# Patient Record
Sex: Male | Born: 1950
Health system: Southern US, Community
[De-identification: ages and names within clinical notes are randomized; demographics above are authoritative.]

## PROBLEM LIST (undated history)

## (undated) DIAGNOSIS — K635 Polyp of colon: Secondary | ICD-10-CM

## (undated) DIAGNOSIS — E785 Hyperlipidemia, unspecified: Secondary | ICD-10-CM

## (undated) DIAGNOSIS — A4902 Methicillin resistant Staphylococcus aureus infection, unspecified site: Secondary | ICD-10-CM

## (undated) DIAGNOSIS — C181 Malignant neoplasm of appendix: Secondary | ICD-10-CM

## (undated) DIAGNOSIS — C801 Malignant (primary) neoplasm, unspecified: Secondary | ICD-10-CM

## (undated) DIAGNOSIS — N529 Male erectile dysfunction, unspecified: Secondary | ICD-10-CM

## (undated) HISTORY — DX: Methicillin resistant Staphylococcus aureus infection, unspecified site: A49.02

## (undated) HISTORY — PX: SHOULDER SURGERY: SHX246

## (undated) HISTORY — DX: Malignant (primary) neoplasm, unspecified: C80.1

## (undated) HISTORY — DX: Polyp of colon: K63.5

## (undated) HISTORY — DX: Hyperlipidemia, unspecified: E78.5

## (undated) HISTORY — DX: Malignant neoplasm of appendix: C18.1

## (undated) HISTORY — DX: Male erectile dysfunction, unspecified: N52.9

## (undated) HISTORY — PX: CORONARY ANGIOPLASTY: SHX604

---

## 2006-07-09 ENCOUNTER — Ambulatory Visit: Payer: Self-pay | Admitting: Gastroenterology

## 2006-07-09 HISTORY — PX: COLONOSCOPY: SHX174

## 2006-07-09 LAB — HM COLONOSCOPY

## 2006-08-04 DIAGNOSIS — C801 Malignant (primary) neoplasm, unspecified: Secondary | ICD-10-CM

## 2006-08-04 HISTORY — DX: Malignant (primary) neoplasm, unspecified: C80.1

## 2006-10-13 ENCOUNTER — Ambulatory Visit: Payer: Self-pay | Admitting: Urology

## 2007-01-03 HISTORY — PX: PROSTATE SURGERY: SHX751

## 2007-01-11 ENCOUNTER — Encounter (INDEPENDENT_AMBULATORY_CARE_PROVIDER_SITE_OTHER): Payer: Self-pay | Admitting: Urology

## 2007-01-11 ENCOUNTER — Inpatient Hospital Stay (HOSPITAL_COMMUNITY): Admission: RE | Admit: 2007-01-11 | Discharge: 2007-01-12 | Payer: Self-pay | Admitting: Urology

## 2007-02-04 ENCOUNTER — Ambulatory Visit: Payer: Self-pay | Admitting: Family Medicine

## 2007-05-19 ENCOUNTER — Ambulatory Visit: Payer: Self-pay | Admitting: Family Medicine

## 2010-12-17 NOTE — Op Note (Signed)
Edward Mccoy, Edward Mccoy NO.:  [id number]   MEDICAL RECORD NO.:  [id number]          PATIENT TYPE:  INP   LOCATION:  0008                         FACILITY:  [facility]   PHYSICIAN:  Heloise Purpura, MD      DATE OF BIRTH:  12/07/1950   DATE OF PROCEDURE:  01/11/2007  DATE OF DISCHARGE:                               OPERATIVE REPORT   PREOPERATIVE DIAGNOSIS:  Clinically localized adenocarcinoma of the  prostate.   POSTOPERATIVE DIAGNOSIS:  Clinically localized adenocarcinoma of the  prostate.   PROCEDURES:  1. Robotic-assisted laparoscopic radical prostatectomy (left nerve-      sparing).  2. Bilateral pelvic lymphadenectomy.   SURGEON:  Crecencio Mc, MD   ASSISTANT:  Terie Purser, MD   ANESTHESIA:  General.   COMPLICATIONS:  None.   ESTIMATED BLOOD LOSS:  150 mL.   INTRAVENOUS FLUIDS:  1500 mL of lactated Ringer's.   SPECIMENS:  1. Prostate and seminal vesicles.  2. Right pelvic lymph nodes.  3. Left pelvic lymph nodes.   DISPOSITION OF SPECIMEN:  To pathology.   DRAINS:  1. 20-French Coude catheter.  2. #19 Blake pelvic drain.   INDICATION:  [name] is a 60 year old gentleman with clinical stage T1-  C prostate cancer with a PSA of 5.5 and a Gleason score of 3+4=7.  He  had undergone a bone scan which was negative for metastatic disease.  After discussing management options for clinically localized prostate  cancer, he elected to proceed with surgical therapy and a robotic  prostatectomy.   DESCRIPTION OF PROCEDURE:  The patient was taken to the operating room  and a general anesthetic was administered.  He was given preoperative  antibiotics, placed in the dorsal lithotomy position, and prepped and  draped in the usual sterile fashion.  Next a preoperative time-out was  performed.  A Foley catheter was then inserted into the bladder.  A site  was selected just the left of the umbilicus for placement of the camera  port.  This was placed using a  standard open Hasson technique, which  allowed entry into the peritoneal cavity under direct vision, and a 12  mm port was then placed.  A pneumoperitoneum was established.  The 0-  degree lens was used to inspect the abdomen and there was no evidence of  any intra-abdominal injuries or other abnormalities.  The remaining  ports were then placed.  Bilateral 8 mm robotic ports were placed 10 cm  lateral to the camera port just inferior to the camera port site.  An  additional 8 mm robotic port was placed in the far left lateral  abdominal wall.  A 5 mm port was placed between the camera port and the  right robotic port.  An additional 12 mm port was placed in the far  right lateral abdominal wall.  All ports were placed under direct vision  and without difficulty.  The surgical cart was then docked.  With the  aid of the cautery scissors, the bladder was reflected posteriorly  allowing entry in the space of Retzius and  identification of the  endopelvic fascia and prostate.  The endopelvic fascia was incised from  the apex back the base of the prostate bilaterally and the underlying  levator muscle fibers were swept laterally off the prostate.  This  isolated the dorsal venous complex, which was then stapled and divided  with a 45 mm flex ETS stapler.  The bladder neck was identified with the  aid of Foley catheter manipulation and was divided anteriorly until the  Foley catheter was exposed.  The catheter balloon was deflated and the  catheter was brought into the operative field and used to retract the  prostate anteriorly.  This exposed the posterior bladder neck, which was  then divided and dissection continued between the bladder and prostate  until the vasa deferentia and seminal vesicles were identified.  The  vasa deferentia were identified, isolated and divided.  They were then  lifted anteriorly.  The seminal vesicles were dissected down to their  tips and after controlling the  seminal vesicle arterial blood supply,  lifted anteriorly.  The space between Denonvilliers fascia and the  anterior rectum was then bluntly developed, thereby isolating the  vascular pedicles of the prostate.  The lateral prostatic fascia on the  left side was incised sharply, allowing the neurovascular bundles to be  spread laterally and posteriorly off the prostate.  Hem-o-lok clips were  then used to divide the vascular pedicle of the prostate with division  of the vascular pedicles with sharp cold scissors dissection.  The  neurovascular bundle was then swept off the prostate out to the apex and  urethra.  On the right side a wide, non-nerve-sparing dissection was  performed with Hem-o-lok clips used to control the vascular pedicle of  the prostate.  The urethra was then sharply divided, allowing the  prostate specimen to be disarticulated.  The pelvis was copiously  irrigated and with irrigation in the pelvis, air was injected into the  rectal catheter.  There was no evidence of a rectal injury.  Hemostasis  appeared to be okay at this point.  Attention then turned to the right  pelvic sidewall.  The fibrofatty tissue between the external iliac vein,  confluence of the iliac vessels, and extending down to the hypogastric  artery and Cooper's ligament was dissected free from the pelvic  sidewall.  The obturator nerve was preserved.  Hem-o-lok clips were used  for lymphostasis and hemostasis.  An identical procedure was performed  on the contralateral side.  Both lymphatic packets were removed for  permanent pathologic analysis.  Attention then turned to the urethral  anastomosis.  A 2-0 Vicryl slip-knot was used to reapproximate  Denonvilliers fascia to the bladder neck and the urethra.  A double-  armed 3-0 Monocryl suture was then used to perform a 360-degree running  tension-free anastomosis between the bladder neck and urethra.  There was noted to be 2 small bleeding arteries off  the left neurovascular  bundle.  They were able to be isolated and lifted off the neurovascular  bundle with 5 mm Hem-o-lok clips placed to control them.  A new 20-  [place] Coude catheter was inserted into the bladder and irrigated.  There appeared to be no blood clots within the bladder and the  anastomosis appeared to be watertight.  A #19 Blake pelvic drain was  then placed through the left robotic port and appropriately positioned  in the pelvis.  It was secured to the skin with a nylon suture.  The  surgical cart was then undocked.  The right lateral 12 mm port site was  closed with a 0 Vicryl suture placed with the aid of the suture passer  device.  The prostate specimen was then removed intact within the  Endopouch retrieval bag via the periumbilical port site.  This fascial  opening was closed with a running 0 Vicryl suture.  All remaining port  sites had been removed under direct vision.  Marcaine 0.25% was  then injected into all incision sites, which were reapproximated at the  skin level with staples.  Sterile dressings were applied.  The patient  appeared to tolerate the procedure well and without complications.  He  was able to be extubated and transferred to the recovery unit in  satisfactory condition.           ______________________________  Heloise Purpura, MD  Electronically Signed     LB/MEDQ  D:  01/11/2007  T:  01/11/2007  Job:  [id number]

## 2010-12-17 NOTE — Discharge Summary (Signed)
[name], [name] NO.:  [id number]   MEDICAL RECORD NO.:  [id number]          PATIENT TYPE:  INP   LOCATION:  1419                         FACILITY:  [facility]   PHYSICIAN:  Heloise Purpura, MD      DATE OF BIRTH:  12-19-1950   DATE OF ADMISSION:  01/11/2007  DATE OF DISCHARGE:  01/12/2007                               DISCHARGE SUMMARY   ADMISSION DIAGNOSIS:  Prostate cancer.   DISCHARGE DIAGNOSIS:  Prostate cancer.   PROCEDURE:  1. Robotic assisted laparoscopic radical prostatectomy.  2. Bilateral pelvic lymphadenectomy.   HISTORY AND PHYSICAL:  For full details, please see admission History  and Physical.  Briefly, [name] is a 60 year old gentleman with  clinical stage T1C prostate cancer with a PSA of 5.5 and Gleason score  of 3+4=7.  After undergoing a bone scan, which was negative metastatic  disease, he elected to proceed with surgical therapy.   HOSPITAL COURSE:  The patient was taken to the operating room on January 11, 2007, and underwent the above procedures.  He tolerated these procedures  well without complications.  Postoperatively, he was able to be  transferred to a regular hospital room.  He was able to begin ambulating  the night of surgery which he did without difficulty.   On postoperative day #1, he was noted to be hemodynamically stable.  His  hemoglobin was checked and was 12.3.  He maintained excellent urine  output from his Foley catheter with minimal output from his pelvic  drain.  His pelvic drain was, therefore, removed.  He was begun on a  clear liquid diet which he tolerated well and was able to be  transitioned to oral pain medication.  By the afternoon of postoperative  day #1, he was able to be discharged home in excellent condition.   DISPOSITION:  Home.   DISCHARGE MEDICATIONS:  The patient was instructed to resume his regular  home medications excepting any aspirin, nonsteroidal anti-inflammatory  drugs, or herbal  supplements.  He was given a prescription to take  Vicodin as needed for pain, told to use Colace as a stool softener, and  given a prescription to begin Cipro 1 day prior to his return visit for  Foley catheter removal.   DISCHARGE INSTRUCTIONS:  The patient was instructed to be ambulatory but  specifically told to refrain from any heavy lifting, strenuous activity  or driving.  He was given instructions on routine Foley catheter care.  He was also told to gradually advance his diet once passing flatus.   FOLLOWUP:  Mr. Ladnier will follow up in 1 week for removal of his Foley  catheter and to discuss his surgical pathology in detail.           ______________________________  Heloise Purpura, MD  Electronically Signed     LB/MEDQ  D:  01/12/2007  T:  01/12/2007  Job:  [id number]

## 2010-12-17 NOTE — H&P (Signed)
NAMERANA, ADORNO NO.:  [id number]   MEDICAL RECORD NO.:  [id number]          PATIENT TYPE:  INP   LOCATION:  0008                         FACILITY:  [facility]   PHYSICIAN:  Heloise Purpura, MD      DATE OF BIRTH:  17-Apr-1951   DATE OF ADMISSION:  01/11/2007  DATE OF DISCHARGE:                              HISTORY & PHYSICAL   CHIEF COMPLAINT:  Prostate cancer.   HISTORY:  Mr. Stratton is a 60 year old gentleman with clinical stage T1c  prostate cancer with a PSA of 5.5 and Gleason score of 3+4=7.  He had  previously undergone a bone scan which was negative for metastatic  disease.  After discussing management options for clinically localized  prostate cancer, the patient elected to proceed with surgical therapy  and a robotic prostatectomy.   PAST MEDICAL HISTORY:  None.   PAST SURGICAL HISTORY:  Left rotator cuff repair.   MEDICATIONS:  Aspirin.   ALLERGIES:  NO KNOWN DRUG ALLERGIES.   FAMILY HISTORY:  No history of GU malignancy or prostate cancer..   SOCIAL HISTORY:  The patient is employed as a Medical illustrator.  He is married.  He denies tobacco and drinks alcohol socially.   REVIEW OF SYSTEMS:  All systems are reviewed and are negative.   PHYSICAL EXAMINATION:  CONSTITUTIONAL:  Well-nourished, well-developed,  age-appropriate male in no acute distress.  CARDIOVASCULAR:  Regular rate and rhythm without obvious murmurs.  LUNGS:  Clear bilaterally.  ABDOMEN:  Soft, nontender, nondistended without abdominal masses.  RECTAL:  Digital rectal exam revealed no nodularity or induration of the  prostate.   IMPRESSION:  Clinically localized prostate cancer.   PLAN:  Mr. Vences will undergo a robotic assisted laparoscopic radical  prostatectomy and bilateral pelvic lymphadenectomy.  He will then be  admitted to the hospital for routine postoperative care.           ______________________________  Heloise Purpura, MD  Electronically Signed     LB/MEDQ  D:   01/11/2007  T:  01/11/2007  Job:  [id number]

## 2011-05-22 LAB — BASIC METABOLIC PANEL
BUN: 12
CO2: 25
Chloride: 103
Creatinine, Ser: 0.84
GFR calc Af Amer: >60
Glucose, Bld: 94

## 2011-05-22 LAB — CBC
MCHC: 34.4
MCV: 91
RBC: 4.83
RDW: 13.1

## 2011-05-22 LAB — HEMOGLOBIN AND HEMATOCRIT, BLOOD
HCT: 36.2 — ABNORMAL LOW
Hemoglobin: 14.1

## 2011-05-22 LAB — TYPE AND SCREEN: ABO/RH(D): A NEG

## 2013-01-31 DIAGNOSIS — Z8546 Personal history of malignant neoplasm of prostate: Secondary | ICD-10-CM | POA: Insufficient documentation

## 2013-01-31 DIAGNOSIS — N529 Male erectile dysfunction, unspecified: Secondary | ICD-10-CM | POA: Insufficient documentation

## 2013-07-06 ENCOUNTER — Ambulatory Visit: Payer: Self-pay | Admitting: Unknown Physician Specialty

## 2013-08-26 ENCOUNTER — Ambulatory Visit: Payer: Self-pay | Admitting: Otolaryngology

## 2014-08-10 LAB — LIPID PANEL
CHOLESTEROL: 210 mg/dL — AB (ref 0–200)
HDL: 51 mg/dL (ref 35–70)
LDL Cholesterol: 131 mg/dL
LDl/HDL Ratio: 2.6
TRIGLYCERIDES: 139 mg/dL (ref 40–160)

## 2014-08-10 LAB — TSH: TSH: 1.87 u[IU]/mL (ref 0.41–5.90)

## 2014-08-10 LAB — CBC AND DIFFERENTIAL
NEUTROS ABS: 4 /uL
WBC: 6.4 10*3/mL

## 2014-08-10 LAB — BASIC METABOLIC PANEL
BUN: 20 mg/dL (ref 4–21)
CREATININE: 1 mg/dL (ref 0.6–1.3)
Glucose: 102 mg/dL
SODIUM: 141 mmol/L (ref 137–147)

## 2014-11-13 ENCOUNTER — Ambulatory Visit
Admit: 2014-11-13 | Disposition: A | Discharge status: Home / Self Care | Payer: Self-pay | Attending: Family Medicine | Admitting: Family Medicine

## 2014-11-13 LAB — CBC WITH DIFFERENTIAL/PLATELET
BASOS PCT: 0.4 %
Basophil #: 0 10*3/uL (ref 0.0–0.1)
EOS PCT: 3.2 %
Eosinophil #: 0.2 10*3/uL (ref 0.0–0.7)
HCT: 41.1 % (ref 40.0–52.0)
HGB: 13.6 g/dL (ref 13.0–18.0)
LYMPHS ABS: 1.8 10*3/uL (ref 1.0–3.6)
Lymphocyte %: 28.9 %
MCH: 30.5 pg (ref 26.0–34.0)
MCHC: 33.1 g/dL (ref 32.0–36.0)
MCV: 92 fL (ref 80–100)
MONO ABS: 0.7 x10 3/mm (ref 0.2–1.0)
Monocyte %: 10.6 %
Neutrophil #: 3.5 10*3/uL (ref 1.4–6.5)
Neutrophil %: 56.9 %
Platelet: 190 10*3/uL (ref 150–440)
RBC: 4.46 10*6/uL (ref 4.40–5.90)
RDW: 12.6 % (ref 11.5–14.5)
WBC: 6.2 10*3/uL (ref 3.8–10.6)

## 2014-11-13 LAB — COMPREHENSIVE METABOLIC PANEL
ALBUMIN: 4.6 g/dL
ALT: 34 U/L
ANION GAP: 7 (ref 7–16)
Alkaline Phosphatase: 39 U/L
BUN: 18 mg/dL
Bilirubin,Total: 0.9 mg/dL
CALCIUM: 9.1 mg/dL
CO2: 28 mmol/L
Chloride: 104 mmol/L
Creatinine: 1.02 mg/dL
Glucose: 93 mg/dL
Potassium: 3.9 mmol/L
SGOT(AST): 30 U/L
Sodium: 139 mmol/L
TOTAL PROTEIN: 7.3 g/dL

## 2014-11-14 ENCOUNTER — Encounter: Payer: Self-pay | Admitting: General Surgery

## 2014-11-14 ENCOUNTER — Ambulatory Visit (INDEPENDENT_AMBULATORY_CARE_PROVIDER_SITE_OTHER): Payer: BLUE CROSS/BLUE SHIELD | Admitting: General Surgery

## 2014-11-14 VITALS — BP 132/76 | HR 68 | Resp 12 | Ht 69.0 in | Wt 192.0 lb

## 2014-11-14 DIAGNOSIS — R1031 Right lower quadrant pain: Secondary | ICD-10-CM

## 2014-11-14 DIAGNOSIS — K389 Disease of appendix, unspecified: Secondary | ICD-10-CM | POA: Diagnosis not present

## 2014-11-14 DIAGNOSIS — K388 Other specified diseases of appendix: Secondary | ICD-10-CM

## 2014-11-14 NOTE — Progress Notes (Signed)
Patient ID: Edward Mccoy, male   DOB: 1951-07-10, 64 y.o.   MRN: [id number]  Chief Complaint  Patient presents with  . Abdominal Pain    HPI Edward Mccoy is a 64 y.o. male.  Here today for evaluation of abdominal pain. He states it started about 2 months ago. CT scan was 11-13-14. The constant pain is in the lower right abdomen. He notices it more with stepping movements and taking deep breaths.   HPI  Past Medical History  Diagnosis Date  . Cancer 2008    prostate  . Colon polyp     Past Surgical History  Procedure Laterality Date  . Prostate surgery  June 2008  . Shoulder surgery  Nov 1999, Oct 2015  . Colonoscopy  07-09-06    Dr Allen Norris    Family History  Problem Relation Age of Onset  . Cancer Mother     Social History History  Substance Use Topics  . Smoking status: Never Smoker   . Smokeless tobacco: Never Used  . Alcohol Use: 0.0 oz/week    0 Standard drinks or equivalent per week     Comment: occasionally    No Known Allergies  Current Outpatient Prescriptions  Medication Sig Dispense Refill  . Ascorbic Acid (VITAMIN C) 1000 MG tablet Take 1,000 mg by mouth daily.    Marland Kitchen aspirin EC 81 MG tablet Take 81 mg by mouth daily.    . Multiple Vitamins-Minerals (MULTIVITAMIN ADULT PO) Take by mouth daily.    . simvastatin (ZOCOR) 10 MG tablet Take 10 mg by mouth daily.    . vitamin E 400 UNIT capsule Take 400 Units by mouth daily.     No current facility-administered medications for this visit.    Review of Systems Review of Systems  Constitutional: Negative.   Respiratory: Negative.   Cardiovascular: Negative.   Gastrointestinal: Positive for abdominal pain.    Blood pressure 132/76, pulse 68, resp. rate 12, height 5\' 9"  (1.753 m), weight 192 lb (87.091 kg).  Physical Exam Physical Exam  Constitutional: He is oriented to person, place, and time. He appears well-developed and well-nourished.  Eyes: Conjunctivae are normal. No scleral icterus.  Neck: Neck  supple.  Cardiovascular: Normal rate, regular rhythm and normal heart sounds.   Pulmonary/Chest: Effort normal and breath sounds normal.  Abdominal: Soft. Normal appearance and bowel sounds are normal. There is tenderness in the right lower quadrant. There is no rebound and no guarding. No hernia.  Mild tenderness RLQ no guarding or rebound.  Lymphadenopathy:    He has no cervical adenopathy.  Neurological: He is alert and oriented to person, place, and time.  Skin: Skin is warm and dry.    Data Reviewed Office notes. CT- reviewed. Findings suggest a mucocele of appendix CBC is normal Assessment    Appendix mass-likely a mucocele.      Plan    Discussed risk and benefits of laparoscopic appendectomy, patient agrees to proceed. Also advised on possible need for right colectomy in the event of a malignancy.  Patient is scheduled for surgery at [facility] on 11/15/14. He will pre admit at the hospital on 11/15/14 at 9:45 am. Patient is aware of date, time, and all instructions.      PCP:  Cranford Mon, Richard   Junie Panning G 11/14/2014, 11:06 AM

## 2014-11-14 NOTE — Patient Instructions (Addendum)
The patient is aware to call back for any questions or concerns. Laparoscopic Appendectomy Appendectomy is surgery to remove the appendix. Laparoscopic surgery uses several small cuts (incisions) instead of one large incision. Laparoscopic surgery offers a shorter recovery time and less discomfort. LET YOUR CAREGIVER KNOW ABOUT:  Allergies to food or medicine.  Medicines taken, including vitamins, dietary supplements, herbs, eyedrops, over-the-counter medicines, and creams.  Use of steroids (by mouth or creams).  Previous problems with anesthetics or numbing medicines.  History of bleeding problems or blood clots.  Previous surgery.  Other health problems, including diabetes, heart problems, lung problems, and kidney problems.  Possibility of pregnancy, if this applies. RISKS AND COMPLICATIONS  Infection. A germ starts growing in the wound. This can usually be treated with antibiotics. In some cases, the wound will need to be opened and cleaned.  Bleeding.  Damage to other organs.  Sores (abscesses).  Chronic pain at the incision sites. This is defined as pain that lasts for more than 3 months.  Blood clots in the legs that may rarely travel to the lungs.  Infection in the lungs (pneumonia). BEFORE THE PROCEDURE Appendectomy is usually performed immediately after an inflamed appendix (appendicitis) is diagnosed. No preparation is necessary ahead of this procedure. PROCEDURE  You will be given medicine that makes you sleep (general anesthetic). After you are asleep, a flexible tube (catheter) may be inserted into your bladder to drain your urine during surgery. The tube is removed before you wake up after surgery. When you are asleep, carbondioxide gas will be used to inflate your abdomen. This will allow your surgeon to see inside your abdomen and perform your surgery. Three small incisions will be made in your abdomen. Your surgeon will insert a thin, lighted tube  (laparoscope) through one of the incisions. Your surgeon will look through the laparoscope while performing the surgery. Other tools will be inserted through the other incisions. Laparoscopic procedures may not be appropriate when:  There is major scarring from a previous surgery.  The patient has bleeding disorders.  A pregnancy is near term.  There are other conditions which make the laparoscopic procedure impossible, such as an advanced infection or a ruptured appendix. If your surgeon feels it is not safe to continue with the laparoscopic procedure, he or she will perform an open surgery instead. This gives the surgeon a larger view and more space to work. Open surgery requires a longer recovery time. After your appendix is removed, your incisions will be closed with stitches (sutures) or skin adhesive. AFTER THE PROCEDURE You will be taken to a recovery room. When the anesthesia has worn off, you will be returned to your hospital room. You will be given pain medicines to keep you comfortable. Ask your caregiver how long your hospital stay will be. Document Released: 03/04/2004 Document Revised: 10/13/2011 Document Reviewed: 01/28/2011 [facility] Patient Information 2015 [place], [place]. This information is not intended to replace advice given to you by your health care provider. Make sure you discuss any questions you have with your health care provider.  Patient is scheduled for surgery at [facility] on 11/15/14. He will pre admit at the hospital on 11/15/14 at 9:45 am. Patient is aware of date, time, and all instructions.

## 2014-11-15 ENCOUNTER — Ambulatory Visit
Admit: 2014-11-15 | Disposition: A | Discharge status: Home / Self Care | Payer: Self-pay | Attending: General Surgery | Admitting: General Surgery

## 2014-11-15 DIAGNOSIS — C181 Malignant neoplasm of appendix: Secondary | ICD-10-CM

## 2014-11-15 DIAGNOSIS — K388 Other specified diseases of appendix: Secondary | ICD-10-CM | POA: Diagnosis not present

## 2014-11-15 HISTORY — DX: Malignant neoplasm of appendix: C18.1

## 2014-11-15 HISTORY — PX: APPENDECTOMY: SHX54

## 2014-11-17 ENCOUNTER — Encounter: Payer: Self-pay | Admitting: General Surgery

## 2014-11-20 ENCOUNTER — Encounter: Payer: Self-pay | Admitting: General Surgery

## 2014-11-20 ENCOUNTER — Other Ambulatory Visit: Payer: Self-pay | Admitting: *Deleted

## 2014-11-20 ENCOUNTER — Ambulatory Visit (INDEPENDENT_AMBULATORY_CARE_PROVIDER_SITE_OTHER): Payer: BLUE CROSS/BLUE SHIELD | Admitting: General Surgery

## 2014-11-20 ENCOUNTER — Telehealth: Payer: Self-pay | Admitting: *Deleted

## 2014-11-20 ENCOUNTER — Encounter: Payer: Self-pay | Admitting: *Deleted

## 2014-11-20 VITALS — BP 130/74 | HR 74 | Resp 12 | Ht 69.0 in | Wt 200.0 lb

## 2014-11-20 DIAGNOSIS — K388 Other specified diseases of appendix: Secondary | ICD-10-CM

## 2014-11-20 DIAGNOSIS — C181 Malignant neoplasm of appendix: Secondary | ICD-10-CM

## 2014-11-20 DIAGNOSIS — K389 Disease of appendix, unspecified: Secondary | ICD-10-CM

## 2014-11-20 MED ORDER — NEOMYCIN SULFATE 500 MG PO TABS: ORAL_TABLET | ORAL | Status: DC

## 2014-11-20 MED ORDER — METRONIDAZOLE 500 MG PO TABS: ORAL_TABLET | ORAL | Status: AC

## 2014-11-20 MED ORDER — POLYETHYLENE GLYCOL 3350 17 GM/SCOOP PO POWD: ORAL | Status: DC

## 2014-11-20 NOTE — Progress Notes (Signed)
This is a 64 year old male here today with his wife for his post op appendectomy done on 11/15/14. Patient states he is doing well with no new problems at this time.  Incisions are clean and well healing. 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 Discussed treatment options to include right hemicolectomy with lymph node removal. Additional treatment with chemotherapy. Procedure explained including risks and benefits and recovery time. Consulted with oncology. Appt to be made for oncology input.  Questions answered and patient is agreeable.  Follow up in 2 weeks-preop.

## 2014-11-20 NOTE — Progress Notes (Signed)
Patient ID: Edward Mccoy, male   DOB: 1951/07/06, 64 y.o.   MRN: [id number]   Patient's surgery has been scheduled for 12-15-14 at [facility].   This patient has been instructed to complete bowel prep prior. He is to call the office if he has further questions.

## 2014-11-20 NOTE — Progress Notes (Signed)
Bowel prep medications sent to patient's pharmacy.

## 2014-11-20 NOTE — Telephone Encounter (Signed)
Patient has been scheduled for an appointment with Dr. Oliva Bustard at the [facility] for Wednesday, 11-22-14 at 3 pm.  He is aware of date, time, and instructions.

## 2014-11-22 ENCOUNTER — Ambulatory Visit
Admit: 2014-11-22 | Disposition: A | Discharge status: Home / Self Care | Payer: Self-pay | Attending: Oncology | Admitting: Oncology

## 2014-11-22 ENCOUNTER — Other Ambulatory Visit: Payer: Self-pay | Admitting: General Surgery

## 2014-11-22 DIAGNOSIS — C181 Malignant neoplasm of appendix: Secondary | ICD-10-CM

## 2014-11-23 ENCOUNTER — Encounter: Payer: Self-pay | Admitting: General Surgery

## 2014-11-23 ENCOUNTER — Telehealth: Payer: Self-pay | Admitting: *Deleted

## 2014-11-23 LAB — CEA: CEA: 5.8 ng/mL — ABNORMAL HIGH (ref 0.0–4.7)

## 2014-11-23 LAB — CANCER ANTIGEN 19-9: CA 19-9: 13 U/mL (ref 0–35)

## 2014-11-23 NOTE — Telephone Encounter (Signed)
Patient's surgery that was scheduled for 12-15-14 has been cancelled per Dr. Jamal Collin. The patient and Dr. Jamal Collin have gone back and forth and decided not to do at this time.   Leah in the [facility] notified.

## 2014-11-27 LAB — SURGICAL PATHOLOGY

## 2014-12-03 NOTE — Op Note (Signed)
PATIENT NAME:  Edward Mccoy, Edward Mccoy MR#:  [id number] DATE OF BIRTH:  04/06/1951  DATE OF PROCEDURE:  11/15/2014  PREOPERATIVE DIAGNOSIS:  Mucocele of the appendix.   POSTOPERATIVE DIAGNOSIS:  Mucocele of the appendix.  OPERATION PERFORMED: Laparoscopy and appendectomy.   SURGEON:  Mckinley Jewel, M.D.   ANESTHESIA: General.   COMPLICATIONS: None.   ESTIMATED BLOOD LOSS: About 100 mL.   DRAINS: None.  DESCRIPTION OF THE PROCEDURE:  The patient was put to sleep in the supine position on the operating table. Foley catheter was inserted and was removed at the end of the procedure. The abdomen was prepped and draped out as a sterile field and timeout was performed. A port incision was made at the umbilicus and the Veress needle with the InnerDyne sleeve was positioned in the peritoneal cavity and verified with the hang and drop method and pneumoperitoneum was obtained. A 10 mm port was then placed. The camera was introduced with good visualization, and a suprapubic 5 mm port in the left lower quadrant and 12 mm ports were then placed. With the patient placed slightly tilted to the left side, the area of the mucocele as seen on CT was exposed. The patient had a very large mucocele of the appendix, which appeared to be occupying a position alongside the right colon, then going all the way up towards the right upper quadrant. The end of this was not easily visualized, but the base of this was relatively narrow, and accordingly this was first freed from the mesoappendix and transected with the blue load of the Endo GIA.   Following this, a dissection was then performed in a tedious manner to free up the mucocele from the right colon and the right mesentery, and in the course of this, some small bleeding vessels were encountered which required  either cauterization or a hemoclip.   Dissection was continued until the distal end of the appendix was reached, but this portion was so firmly adherent to the mesentery of  the right colon that it was difficult to peel it off satisfactorily. In order to minimize any additional bleeding or other issues, it was felt reasonable to make a small incision overlying this spot. This area was marked in the lateral right  abdomen in the right upper quadrant area, and a 3 cm incision was made and carefully deepened through the layers into the peritoneal cavity. With adequate retraction, the mucocele was identified. The mucocele was brought up through this area and the remaining attachments were then taken down carefully and then the attachments were clamped, cut and ligated with 3-0 Vicryl. The specimen was sent in formalin to pathology.   The peritoneum of this lateral incision was closed with a running 0 Vicryl stitch. The wound was irrigated and the fascial layers closed with interrupted figure-of-eight stitches of 0 Maxon. Subcutaneous tissue was closed with 2-0 Vicryl and the skin closed with subcuticular 4-0 Vicryl. The pneumoperitoneum was reinstituted and the camera was introduced, and the right upper quadrant and the right gutter area were then carefully inspected with no bleeding noted. The area was irrigated and all fluid suctioned out from this area. The staple line on the appendiceal stump appeared to be intact. Pneumoperitoneum was released after closure of the left lower quadrant port site with a 0 Vicryl stitch. After removal of the remaining ports, the fascial opening at the umbilicus was closed with 0 Vicryl and the skin incisions were all closed with subcuticular 4-0 Vicryl. All incisions  were covered with LiquiBand. The patient subsequently was returned to the recovery room in stable condition    ____________________________ S.Robinette Haines, MD [phone] D: 11/16/2014 08:29:00 ET T: 11/16/2014 09:13:03 ET JOB#: [id number]  cc: Synthia Innocent. Jamal Collin, MD, <Dictator> [facility] Robinette Haines MD ELECTRONICALLY SIGNED 11/17/2014 8:42

## 2014-12-04 ENCOUNTER — Ambulatory Visit (INDEPENDENT_AMBULATORY_CARE_PROVIDER_SITE_OTHER): Payer: BLUE CROSS/BLUE SHIELD | Admitting: General Surgery

## 2014-12-04 ENCOUNTER — Encounter: Payer: Self-pay | Admitting: General Surgery

## 2014-12-04 DIAGNOSIS — C181 Malignant neoplasm of appendix: Secondary | ICD-10-CM

## 2014-12-04 NOTE — Progress Notes (Signed)
This is a 64 year old male here today  for his post op appendectomy done on 11/15/14. Patient states he is doing well with no new problems at this time.  Incisions and port sites are clean andl healing well..Abdomen is soft.  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 His case was discussed at length in tumor case conference. As the lesion is well contained in the appendix, careful follow up was recommended to include labs, imaging and diagnostic laparoscopy.  Pt will be followed by Dr. Oliva Bustard from oncology in addition to follow up here.  Pt advised again. Likely will need a diagnostic laparoscopy in 38mos.

## 2014-12-04 NOTE — Patient Instructions (Addendum)
Patient to return in five months

## 2014-12-05 ENCOUNTER — Encounter: Payer: Self-pay | Admitting: General Surgery

## 2014-12-15 ENCOUNTER — Inpatient Hospital Stay: Admit: 2014-12-15 | Payer: Self-pay | Admitting: General Surgery

## 2014-12-15 SURGERY — COLECTOMY, RIGHT, LAPAROSCOPIC
Anesthesia: General | Laterality: Right

## 2015-01-05 ENCOUNTER — Other Ambulatory Visit: Payer: Self-pay | Admitting: *Deleted

## 2015-01-05 DIAGNOSIS — C181 Malignant neoplasm of appendix: Secondary | ICD-10-CM

## 2015-01-08 ENCOUNTER — Inpatient Hospital Stay: Payer: 59 | Attending: Oncology

## 2015-01-08 DIAGNOSIS — Z79899 Other long term (current) drug therapy: Secondary | ICD-10-CM | POA: Insufficient documentation

## 2015-01-08 DIAGNOSIS — Z8589 Personal history of malignant neoplasm of other organs and systems: Secondary | ICD-10-CM | POA: Insufficient documentation

## 2015-01-08 DIAGNOSIS — Z7982 Long term (current) use of aspirin: Secondary | ICD-10-CM | POA: Insufficient documentation

## 2015-01-08 DIAGNOSIS — Z809 Family history of malignant neoplasm, unspecified: Secondary | ICD-10-CM | POA: Diagnosis not present

## 2015-01-08 DIAGNOSIS — Z86010 Personal history of colonic polyps: Secondary | ICD-10-CM | POA: Insufficient documentation

## 2015-01-08 DIAGNOSIS — Z8546 Personal history of malignant neoplasm of prostate: Secondary | ICD-10-CM | POA: Diagnosis not present

## 2015-01-08 DIAGNOSIS — C181 Malignant neoplasm of appendix: Secondary | ICD-10-CM

## 2015-01-09 LAB — CEA: CEA: 0.7 ng/mL (ref 0.0–4.7)

## 2015-01-22 ENCOUNTER — Inpatient Hospital Stay (HOSPITAL_BASED_OUTPATIENT_CLINIC_OR_DEPARTMENT_OTHER): Payer: 59 | Admitting: Oncology

## 2015-01-22 VITALS — BP 128/73 | HR 52 | Temp 96.2°F | Wt 183.4 lb

## 2015-01-22 DIAGNOSIS — Z79899 Other long term (current) drug therapy: Secondary | ICD-10-CM | POA: Diagnosis not present

## 2015-01-22 DIAGNOSIS — Z86010 Personal history of colonic polyps: Secondary | ICD-10-CM

## 2015-01-22 DIAGNOSIS — Z8589 Personal history of malignant neoplasm of other organs and systems: Secondary | ICD-10-CM | POA: Diagnosis not present

## 2015-01-22 DIAGNOSIS — Z809 Family history of malignant neoplasm, unspecified: Secondary | ICD-10-CM

## 2015-01-22 DIAGNOSIS — Z7982 Long term (current) use of aspirin: Secondary | ICD-10-CM

## 2015-01-22 DIAGNOSIS — C181 Malignant neoplasm of appendix: Secondary | ICD-10-CM

## 2015-01-22 DIAGNOSIS — Z8546 Personal history of malignant neoplasm of prostate: Secondary | ICD-10-CM

## 2015-01-22 NOTE — Progress Notes (Signed)
Pt does have living will.  Never smoked.

## 2015-02-01 ENCOUNTER — Encounter: Payer: Self-pay | Admitting: Oncology

## 2015-02-01 NOTE — Progress Notes (Signed)
[place] @ [facility] [phone]  Fax:([phone]) [place]: 02/04/1951  MR#: [id number]  INO#:[id number]  Patient Care Team: [name]., MD as PCP - General (Family Medicine) [name]., MD (Family Medicine) Seeplaputhur Robinette Haines, MD (General Surgery)  CHIEF COMPLAINT:  Chief Complaint  Patient presents with  . Follow-up    Oncology History   Carcinoma of appendix. Mucinouscarcinoma no evidence of invasion or extra appendiceal tumor present status post appendectomyin April of 2016     Malignant neoplasm of appendix   11/20/2014 Initial Diagnosis Malignant neoplasm of appendix    No flowsheet data found.  INTERVAL HISTORY: 64 year old gentleman with a history of carcinoma of the appendix came today further follow-up. Patient has slightly elevated CEA which on repeat examination today has dropped to 0.7.  No abdominal pain.  No nausea.  No vomiting.  No diarrhea.  REVIEW OF SYSTEMS:   GENERAL:  Feels good.  Active.  No fevers, sweats or weight loss. PERFORMANCE STATUS (ECOG):  0 HEENT:  No visual changes, runny nose, sore throat, mouth sores or tenderness. Lungs: No shortness of breath or cough.  No hemoptysis. Cardiac:  No chest pain, palpitations, orthopnea, or PND. GI:  No nausea, vomiting, diarrhea, constipation, melena or hematochezia. GU:  No urgency, frequency, dysuria, or hematuria. Musculoskeletal:  No back pain.  No joint pain.  No muscle tenderness. Extremities:  No pain or swelling. Skin:  No rashes or skin changes. Neuro:  No headache, numbness or weakness, balance or coordination issues. Endocrine:  No diabetes, thyroid issues, hot flashes or night sweats. Psych:  No mood changes, depression or anxiety. Pain:  No focal pain. Review of systems:  All other systems reviewed and found to be negative. As per HPI. Otherwise, a complete review of systems is negatve.  PAST MEDICAL HISTORY: Past Medical History    Diagnosis Date  . Cancer 2008    prostate  . Colon polyp     PAST SURGICAL HISTORY: Past Surgical History  Procedure Laterality Date  . Prostate surgery  June 2008  . Shoulder surgery  Nov 1999, Oct 2015  . Colonoscopy  07-09-06    Dr Allen Norris  . Appendectomy  11/15/14    FAMILY HISTORY Family History  Problem Relation Age of Onset  . Cancer Mother     ADVANCED DIRECTIVES:  No flowsheet data found.  HEALTH MAINTENANCE: History  Substance Use Topics  . Smoking status: Never Smoker   . Smokeless tobacco: Never Used  . Alcohol Use: 0.0 oz/week    0 Standard drinks or equivalent per week     Comment: occasionally      No Known Allergies  Current Outpatient Prescriptions  Medication Sig Dispense Refill  . Ascorbic Acid (VITAMIN C) 1000 MG tablet Take 1,000 mg by mouth daily.    Marland Kitchen aspirin EC 81 MG tablet Take 81 mg by mouth daily.    . Multiple Vitamins-Minerals (MULTIVITAMIN ADULT PO) Take by mouth daily.    Marland Kitchen neomycin (MYCIFRADIN) 500 MG tablet Take two (2) tablets at 6 pm and two (2) tablets at 11 pm the night prior to surgery. 4 tablet 0  . simvastatin (ZOCOR) 10 MG tablet Take 10 mg by mouth daily.    . vitamin E 400 UNIT capsule Take 400 Units by mouth daily.    . polyethylene glycol powder (GLYCOLAX/MIRALAX) powder 255 grams one bottle for colonoscopy prep (Patient not taking: Reported on 01/22/2015) 255 g  0   No current facility-administered medications for this visit.    OBJECTIVE:  Filed Vitals:   01/22/15 0945  BP: 128/73  Pulse: 52  Temp: 96.2 F (35.7 C)     Body mass index is 27.07 kg/(m^2).    ECOG FS:0 - Asymptomatic  PHYSICAL EXAM: GENERAL:  Well developed, well nourished, sitting comfortably in the exam room in no acute distress. MENTAL STATUS:  Alert and oriented to person, place and time. HEAD:  Normocephalic, atraumatic, face symmetric, no Cushingoid features. EYES:  .  Pupils equal round and reactive to light and accomodation.  No  conjunctivitis or scleral icterus. ENT:  Oropharynx clear without lesion.  Tongue normal. Mucous membranes moist.  RESPIRATORY:  Clear to auscultation without rales, wheezes or rhonchi. CARDIOVASCULAR:  Regular rate and rhythm without murmur, rub or gallop.  ABDOMEN:  Soft, non-tender, with active bowel sounds, and no hepatosplenomegaly.  No masses. BACK:  No CVA tenderness.  No tenderness on percussion of the back or rib cage. SKIN:  No rashes, ulcers or lesions. EXTREMITIES: No edema, no skin discoloration or tenderness.  No palpable cords. LYMPH NODES: No palpable cervical, supraclavicular, axillary or inguinal adenopathy  NEUROLOGICAL: Unremarkable. PSYCH:  Appropriate.  LAB RESULTS:  No visits with results within 2 Day(s) from this visit. Latest known visit with results is:  Appointment on 01/08/2015  Component Date Value Ref Range Status  . CEA 01/08/2015 0.7  0.0 - 4.7 ng/mL Final   Comment: (NOTE)       Roche ECLIA methodology       Nonsmokers  <3.9                                     Smokers     <5.6 Performed At: [facility] [place], [place] [id number] Lindon Romp MD DQ:[id number]       ASSESSMENT: Carcinoma of appendix  MEDICAL DECISION MAKING:  All lab data has been reviewed.   On clinical ground there is no evidence of recurrent disease  CEA has now decreased to normal range of 0.7  No further investigation is being planned  Repeat CT scan in one year from or regional diagnoses or before if patient develops any symptoms . Patient expressed understanding and was in agreement with this plan. He also understands that He can call clinic at any time with any questions, concerns, or complaints.    No matching staging information was found for the patient.  Forest Gleason, MD   02/01/2015 7:57 AM

## 2015-05-05 DIAGNOSIS — A4902 Methicillin resistant Staphylococcus aureus infection, unspecified site: Secondary | ICD-10-CM

## 2015-05-05 HISTORY — DX: Methicillin resistant Staphylococcus aureus infection, unspecified site: A49.02

## 2015-05-09 ENCOUNTER — Ambulatory Visit (INDEPENDENT_AMBULATORY_CARE_PROVIDER_SITE_OTHER): Payer: 59 | Admitting: General Surgery

## 2015-05-09 ENCOUNTER — Encounter: Payer: Self-pay | Admitting: General Surgery

## 2015-05-09 VITALS — BP 130/78 | HR 80 | Resp 14 | Ht 69.0 in | Wt 194.8 lb

## 2015-05-09 DIAGNOSIS — C181 Malignant neoplasm of appendix: Secondary | ICD-10-CM

## 2015-05-09 NOTE — Patient Instructions (Signed)
CT scan in April 2017 then return to Dr. Jamal Collin for follow-up visit.

## 2015-05-09 NOTE — Progress Notes (Signed)
Patient ID: Edward Mccoy, male   DOB: 01-May-1951, 64 y.o.   MRN: [id number]  Chief Complaint  Patient presents with  . Follow-up    HPI Edward Mccoy is a 64 y.o. male here following up from appendectomy done on 11/15/14 for a contained mucinous neoplasm. He reports that he is doing well. He does have some residual tenderness in the area with deep breathing or certain movements. He reports no GI problems. HPI  Past Medical History  Diagnosis Date  . Colon polyp   . Cancer [facility]) 2008    prostate  . Cancer of appendix ([place]) 11/15/14    high-grade appendiceal mucinous neoplasm    Past Surgical History  Procedure Laterality Date  . Prostate surgery  June 2008  . Shoulder surgery  Nov 1999, Oct 2015  . Colonoscopy  07-09-06    Dr Allen Norris  . Appendectomy  11/15/14    Family History  Problem Relation Age of Onset  . Cancer Mother     Social History Social History  Substance Use Topics  . Smoking status: Never Smoker   . Smokeless tobacco: Never Used  . Alcohol Use: 0.0 oz/week    0 Standard drinks or equivalent per week     Comment: occasionally    No Known Allergies  Current Outpatient Prescriptions  Medication Sig Dispense Refill  . Ascorbic Acid (VITAMIN C) 1000 MG tablet Take 1,000 mg by mouth daily.    Marland Kitchen aspirin EC 81 MG tablet Take 81 mg by mouth daily.    . Multiple Vitamins-Minerals (MULTIVITAMIN ADULT PO) Take by mouth daily.    . simvastatin (ZOCOR) 10 MG tablet Take 10 mg by mouth daily.    . vitamin E 400 UNIT capsule Take 400 Units by mouth daily.     No current facility-administered medications for this visit.    Review of Systems Review of Systems  Constitutional: Negative.   Respiratory: Negative.   Cardiovascular: Negative.     Blood pressure 130/78, pulse 80, resp. rate 14, height 5\' 9"  (1.753 m), weight 194 lb 12.8 oz (88.361 kg).  Physical Exam Physical Exam  Constitutional: He is oriented to person, place, and time. He appears well-developed and  well-nourished.  Eyes: Conjunctivae are normal. No scleral icterus.  Neck: Neck supple.  Cardiovascular: Normal rate, regular rhythm and normal heart sounds.   Pulmonary/Chest: Effort normal and breath sounds normal.  Abdominal: Soft. Bowel sounds are normal.  Lymphadenopathy:    He has no cervical adenopathy.    He has no axillary adenopathy.  Neurological: He is alert and oriented to person, place, and time.  Skin: Skin is warm and dry.    Data Reviewed Previous notes, including surgical pathology and Dr. Metro Kung note. CEA that was mildly elevated has returned to 0.7 on 02/01/2015.  Assessment    History of mucinous neoplasm of appendix; 6 months post-appendectomy. No clinical evidence of recurrence.    Plan    CT scan in April 2017 then follow-up visit. Discussed fully with patient.    PCP: Milagros Reap 05/09/2015, 9:31 AM

## 2015-05-22 ENCOUNTER — Encounter: Payer: 59 | Attending: Surgery | Admitting: Surgery

## 2015-05-22 DIAGNOSIS — L97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 with fat layer exposed: Secondary | ICD-10-CM | POA: Insufficient documentation

## 2015-05-22 DIAGNOSIS — S80261A Insect bite (nonvenomous), right knee, initial encounter: Secondary | ICD-10-CM | POA: Diagnosis not present

## 2015-05-22 DIAGNOSIS — L03115 Cellulitis of right lower limb: Secondary | ICD-10-CM | POA: Diagnosis not present

## 2015-05-22 DIAGNOSIS — X58XXXA Exposure to other specified factors, initial encounter: Secondary | ICD-10-CM | POA: Insufficient documentation

## 2015-05-23 NOTE — Progress Notes (Signed)
Edward, Mccoy ([id number]) Visit Report for 05/22/2015 Abuse/Suicide Risk Screen Details Patient Name: Edward Mccoy, Edward Mccoy 05/22/2015 10:45 Date of Service: AM Medical Record [id number] Number: Patient Account Number: [id number] 1951/08/02 (64 y.o. Treating RN: Cornell Barman Date of Birth/Sex: Male) Other Clinician: Primary Care Physician: Cranford Mon, RICHARD Treating Britto, Errol Referring Physician: Leanor Kail Physician/Extender: Weeks in Treatment: 0 Abuse/Suicide Risk Screen Items Answer ABUSE/SUICIDE RISK SCREEN: Has anyone close to you tried to hurt or harm you recentlyo No Do you feel uncomfortable with anyone in your familyo No Has anyone forced you do things that you didnot want to doo No Do you have any thoughts of harming yourselfo No Patient displays signs or symptoms of abuse and/or neglect. No Electronic Signature(s) Signed: 05/22/2015 5:34:28 PM By: Gretta Cool, RN, BSN, Kim RN, BSN Entered By: Gretta Cool, RN, BSN, Kim on 05/22/2015 11:08:02 Danielson, [name] ([id number]) -------------------------------------------------------------------------------- Activities of Daily Living Details Patient Name: Edward, Mccoy 05/22/2015 10:45 Date of Service: AM Medical Record [id number] Number: Patient Account Number: [id number] 1951-03-13 (64 y.o. Treating RN: Cornell Barman Date of Birth/Sex: Male) Other Clinician: Primary Care Physician: Cranford Mon, RICHARD Treating Christin Fudge Referring Physician: Leanor Kail Physician/Extender: Suella Grove in Treatment: 0 Activities of Daily Living Items Answer Activities of Daily Living (Please select one for each item) Drive Automobile Completely Able Take Medications Completely Able Use Telephone Completely Able Care for Appearance Completely Able Use Toilet Completely Able Bath / Shower Completely Able Dress Self Completely Able Feed Self Completely Able Walk Completely Able Get In / Out Bed Completely Able Housework Completely  Able Prepare Meals Completely Able Handle Money Completely Able Shop for Self Completely Able Electronic Signature(s) Signed: 05/22/2015 5:34:28 PM By: Gretta Cool, RN, BSN, Kim RN, BSN Entered By: Gretta Cool, RN, BSN, Kim on 05/22/2015 11:08:24 Marcoux, [name] ([id number]) -------------------------------------------------------------------------------- Education Assessment Details Patient Name: Edward, Mccoy 05/22/2015 10:45 Date of Service: AM Medical Record [id number] Number: Patient Account Number: [id number] July 11, 1951 (64 y.o. Treating RN: Cornell Barman Date of Birth/Sex: Male) Other Clinician: Primary Care Physician: Cranford Mon, RICHARD Treating Christin Fudge Referring Physician: Leanor Kail Physician/Extender: Suella [place] in Treatment: 0 Primary Learner Assessed: Patient Learning Preferences/Education Level/Primary Language Learning Preference: Explanation, Demonstration Highest Education Level: College or Above Preferred Language: English Cognitive Barrier Assessment/Beliefs Language Barrier: No Translator Needed: No Memory Deficit: No Emotional Barrier: No Cultural/Religious Beliefs Affecting Medical No Care: Physical Barrier Assessment Impaired Vision: No Impaired Hearing: No Decreased Hand dexterity: No Knowledge/Comprehension Assessment Knowledge Level: High Comprehension Level: High Ability to understand written High instructions: Ability to understand verbal High instructions: Motivation Assessment Anxiety Level: Calm Cooperation: Cooperative Education Importance: Acknowledges Need Interest in Health Problems: Asks Questions Perception: Coherent Willingness to Engage in Self- High Management Activities: Readiness to Engage in Self- High Management Activities: CHADLEY, DZIEDZIC ([id number]) Electronic Signature(s) Signed: 05/22/2015 5:34:28 PM By: Gretta Cool, RN, BSN, Kim RN, BSN Entered By: Gretta Cool, RN, BSN, Kim on 05/22/2015 11:08:54 Dicola, [name]  ([id number]) -------------------------------------------------------------------------------- Fall Risk Assessment Details Patient Name: Edward, Mccoy 05/22/2015 10:45 Date of Service: AM Medical Record [id number] Number: Patient Account Number: [id number] September 26, 1950 (64 y.o. Treating RN: Cornell Barman Date of Birth/Sex: Male) Other Clinician: Primary Care Physician: Cranford Mon, RICHARD Treating Christin Fudge Referring Physician: Leanor Kail Physician/Extender: Suella [place] in Treatment: 0 Fall Risk Assessment Items FALL RISK ASSESSMENT: History of falling - immediate or within 3 months 0 No Secondary diagnosis 0 No Ambulatory aid None/bed rest/wheelchair/nurse 0 Yes Crutches/cane/walker 0 No Furniture 0 No IV Access/Saline Lock 0 No Gait/Training  Normal/bed rest/immobile 0 Yes Weak 0 No Impaired 0 No Mental Status Oriented to own ability 0 Yes Electronic Signature(s) Signed: 05/22/2015 5:34:28 PM By: Gretta Cool, RN, BSN, Kim RN, BSN Entered By: Gretta Cool, RN, BSN, Kim on 05/22/2015 11:09:01 Langwell, [name] ([id number]) -------------------------------------------------------------------------------- Foot Assessment Details Patient Name: Mccoy, Edward 05/22/2015 10:45 Date of Service: AM Medical Record [id number] Number: Patient Account Number: [id number] 06-Dec-1950 (64 y.o. Treating RN: Cornell Barman Date of Birth/Sex: Male) Other Clinician: Primary Care Physician: Cranford Mon, RICHARD Treating Britto, Errol Referring Physician: Leanor Kail Physician/Extender: Suella Grove in Treatment: 0 Foot Assessment Items Site Locations + = Sensation present, - = Sensation absent, C = Callus, U = Ulcer R = Redness, W = Warmth, M = Maceration, PU = Pre-ulcerative lesion F = Fissure, S = Swelling, D = Dryness Assessment Right: Left: Other Deformity: No No Prior Foot Ulcer: No No Prior Amputation: No No Charcot Joint: No No Ambulatory Status: Gait: Electronic Signature(s) Signed:  05/22/2015 5:34:28 PM By: Gretta Cool, RN, BSN, Kim RN, BSN Entered By: Gretta Cool, RN, BSN, Kim on 05/22/2015 11:09:18 Garner, [name] ([id number]) -------------------------------------------------------------------------------- Nutrition Risk Assessment Details Patient Name: LAKAI, MOREE 05/22/2015 10:45 Date of Service: AM Medical Record [id number] Number: Patient Account Number: [id number] 1951/02/22 (64 y.o. Treating RN: Cornell Barman Date of Birth/Sex: Male) Other Clinician: Primary Care Physician: Cranford Mon, RICHARD Treating Britto, Errol Referring Physician: Leanor Kail Physician/Extender: Suella Grove in Treatment: 0 Height (in): 70 Weight (lbs): 195 Body Mass Index (BMI): 28 Nutrition Risk Assessment Items NUTRITION RISK SCREEN: I have an illness or condition that made me change the kind and/or 0 No amount of food I eat I eat fewer than two meals per day 0 No I eat few fruits and vegetables, or milk products 0 No I have three or more drinks of beer, liquor or wine almost every day 0 No I have tooth or mouth problems that make it hard for me to eat 0 No I don't always have enough money to buy the food I need 0 No I eat alone most of the time 0 No I take three or more different prescribed or over-the-counter drugs a 0 No day Without wanting to, I have lost or gained 10 pounds in the last six 0 No months I am not always physically able to shop, cook and/or feed myself 0 No Nutrition Protocols Good Risk Protocol 0 No interventions needed Moderate Risk Protocol Electronic Signature(s) Signed: 05/22/2015 5:34:28 PM By: Gretta Cool, RN, BSN, Kim RN, BSN Entered By: Gretta Cool, RN, BSN, Kim on 05/22/2015 11:09:10

## 2015-05-23 NOTE — Progress Notes (Signed)
Edward Mccoy, Edward Mccoy ([id number]) Visit Report for 05/22/2015 Allergy List Details Patient Name: Edward Mccoy, Edward Mccoy Date of Service: 05/22/2015 10:45 AM Medical Record Number: [id number] Patient Account Number: [id number] Date of Birth/Sex: 1950/12/18 (63 y.o. Male) Treating RN: Cornell Barman Primary Care Physician: Cranford Mon, Delfino Lovett Other Clinician: Referring Physician: Leanor Kail Treating Physician/Extender: Frann Rider in Treatment: 0 Allergies Active Allergies No Known Drug Allergies Allergy Notes Electronic Signature(s) Signed: 05/22/2015 5:34:28 PM By: Gretta Cool, RN, BSN, Kim RN, BSN Entered By: Gretta Cool, RN, BSN, Kim on 05/22/2015 11:03:24 Rappleye, Edward Mccoy ([id number]) -------------------------------------------------------------------------------- Arrival Information Details Patient Name: Edward Mccoy Date of Service: 05/22/2015 10:45 AM Medical Record Number: [id number] Patient Account Number: [id number] Date of Birth/Sex: 11-03-1950 (63 y.o. Male) Treating RN: Cornell Barman Primary Care Physician: Cranford Mon, Delfino Lovett Other Clinician: Referring Physician: Leanor Kail Treating Physician/Extender: Frann Rider in Treatment: 0 Visit Information Patient Arrived: Ambulatory Arrival Time: 10:46 Accompanied By: self Transfer Assistance: Manual Patient Identification Verified: Yes Secondary Verification Process Yes Completed: Patient Has Alerts: Yes Patient Alerts: Patient on Blood Thinner 81mg  aspirin Electronic Signature(s) Signed: 05/22/2015 5:34:28 PM By: Gretta Cool, RN, BSN, Kim RN, BSN Entered By: Gretta Cool, RN, BSN, Kim on 05/22/2015 10:48:43 Edward Mccoy, Edward Mccoy ([id number]) -------------------------------------------------------------------------------- Clinic Level of Care Assessment Details Patient Name: Edward Mccoy Date of Service: 05/22/2015 10:45 AM Medical Record Number: [id number] Patient Account Number: [id number] Date of Birth/Sex: Apr 10, 1951 (63 y.o.  Male) Treating RN: Cornell Barman Primary Care Physician: Cranford Mon, Delfino Lovett Other Clinician: Referring Physician: Leanor Kail Treating Physician/Extender: Frann Rider in Treatment: 0 Clinic Level of Care Assessment Items TOOL 1 Quantity Score []  - Use when EandM and Procedure is performed on INITIAL visit 0 ASSESSMENTS - Nursing Assessment / Reassessment X - General Physical Exam (combine w/ comprehensive assessment (listed just 1 20 below) when performed on new pt. evals) X - Comprehensive Assessment (HX, ROS, Risk Assessments, Wounds Hx, etc.) 1 25 ASSESSMENTS - Wound and Skin Assessment / Reassessment []  - Dermatologic / Skin Assessment (not related to wound area) 0 ASSESSMENTS - Ostomy and/or Continence Assessment and Care []  - Incontinence Assessment and Management 0 []  - Ostomy Care Assessment and Management (repouching, etc.) 0 PROCESS - Coordination of Care X - Simple Patient / Family Education for ongoing care 1 15 []  - Complex (extensive) Patient / Family Education for ongoing care 0 X - Staff obtains Consents, Records, Test Results / Process Orders 1 10 []  - Staff telephones HHA, Nursing Homes / Clarify orders / etc 0 []  - Routine Transfer to another Facility (non-emergent condition) 0 []  - Routine Hospital Admission (non-emergent condition) 0 X - New Admissions / Biomedical engineer / Ordering NPWT, Apligraf, etc. 1 15 []  - Emergency Hospital Admission (emergent condition) 0 PROCESS - Special Needs []  - Pediatric / Minor Patient Management 0 []  - Isolation Patient Management 0 Edward Mccoy, Edward SPECHT. ([id number]) []  - Hearing / Language / Visual special needs 0 []  - Assessment of Community assistance (transportation, D/C planning, etc.) 0 []  - Additional assistance / Altered mentation 0 []  - Support Surface(s) Assessment (bed, cushion, seat, etc.) 0 INTERVENTIONS - Miscellaneous []  - External ear exam 0 []  - Patient Transfer (multiple staff / Civil Service fast streamer /  Similar devices) 0 []  - Simple Staple / Suture removal (25 or less) 0 []  - Complex Staple / Suture removal (26 or more) 0 []  - Hypo/Hyperglycemic Management (do not check if billed separately) 0 X - Ankle / Brachial Index (ABI) - do not check if  billed separately 1 15 Has the patient been seen at the hospital within the last three years: Yes Total Score: 100 Level Of Care: New/Established - Level 3 Electronic Signature(s) Signed: 05/22/2015 5:34:28 PM By: Gretta Cool, RN, BSN, Kim RN, BSN Entered By: Gretta Cool, RN, BSN, Kim on 05/22/2015 11:32:56 Edward Mccoy, Edward Mccoy ([id number]) -------------------------------------------------------------------------------- Encounter Discharge Information Details Patient Name: Edward Mccoy, Edward Mccoy Date of Service: 05/22/2015 10:45 AM Medical Record Number: [id number] Patient Account Number: [id number] Date of Birth/Sex: 26-Jun-1951 (63 y.o. Male) Treating RN: Cornell Barman Primary Care Physician: Cranford Mon, Delfino Lovett Other Clinician: Referring Physician: Leanor Kail Treating Physician/Extender: Frann Rider in Treatment: 0 Encounter Discharge Information Items Discharge Pain Level: 1 Discharge Condition: Stable Ambulatory Status: Ambulatory Discharge Destination: Home Transportation: Private Auto Accompanied By: self Schedule Follow-up Appointment: Yes Medication Reconciliation completed and provided to Patient/Care Yes Koltin Wehmeyer: Provided on Clinical Summary of Care: 05/22/2015 Form Type Recipient Paper Patient JP Electronic Signature(s) Signed: 05/22/2015 11:45:27 AM By: Ruthine Dose Entered By: Ruthine Dose on 05/22/2015 11:45:27 Puccinelli, Edward Mccoy ([id number]) -------------------------------------------------------------------------------- Lower Extremity Assessment Details Patient Name: Edward Mccoy Date of Service: 05/22/2015 10:45 AM Medical Record Number: [id number] Patient Account Number: [id number] Date of Birth/Sex: 28-Nov-1950 (63 y.o.  Male) Treating RN: Cornell Barman Primary Care Physician: Cranford Mon, Delfino Lovett Other Clinician: Referring Physician: Leanor Kail Treating Physician/Extender: Frann Rider in Treatment: 0 Edema Assessment Assessed: [Left: No] [Right: No] Edema: [Left: N] [Right: o] Vascular Assessment Pulses: Posterior Tibial Palpable: [Right:Yes] Doppler: [Right:Multiphasic] Dorsalis Pedis Palpable: [Right:Yes] Doppler: [Right:Multiphasic] Extremity colors, hair growth, and conditions: Extremity Color: [Right:Normal] Hair Growth on Extremity: [Right:Yes] Temperature of Extremity: [Right:Warm] Capillary Refill: [Right:< 3 seconds] Toe Nail Assessment Left: Right: Thick: No Discolored: No Deformed: No Improper Length and Hygiene: No Electronic Signature(s) Signed: 05/22/2015 5:34:28 PM By: Gretta Cool, RN, BSN, Kim RN, BSN Entered By: Gretta Cool, RN, BSN, Kim on 05/22/2015 10:59:59 Edward Mccoy, Edward Mccoy ([id number]) -------------------------------------------------------------------------------- Multi Wound Chart Details Patient Name: Edward Mccoy Date of Service: 05/22/2015 10:45 AM Medical Record Number: [id number] Patient Account Number: [id number] Date of Birth/Sex: 09/15/1950 (63 y.o. Male) Treating RN: Cornell Barman Primary Care Physician: [name] Other Clinician: Referring Physician: Leanor Kail Treating Physician/Extender: Frann Rider in Treatment: 0 Vital Signs Height(in): 70 Pulse(bpm): 76 Weight(lbs): 195 Blood Pressure 129/87 (mmHg): Body Mass Index(BMI): 28 Temperature(F): 98.1 Respiratory Rate 18 (breaths/min): Photos: [N/A:N/A] Wound Location: Right Knee N/A N/A Wounding Event: Bump N/A N/A Primary Etiology: Infection - not elsewhere N/A N/A classified Date Acquired: 05/11/2015 N/A N/A Weeks of Treatment: 0 N/A N/A Wound Status: Open N/A N/A Clustered Wound: Yes N/A N/A Measurements L x W x D 3x3.2x0.1 N/A N/A (cm) Area (cm) : 7.54 N/A  N/A Volume (cm) : 0.754 N/A N/A % Reduction in Area: 0.00% N/A N/A % Reduction in Volume: 0.00% N/A N/A Classification: Full Thickness Without N/A N/A Exposed Support Structures Exudate Amount: Medium N/A N/A Exudate Type: Serosanguineous N/A N/A Exudate Color: red, brown N/A N/A Wound Margin: Indistinct, nonvisible N/A N/A Granulation Amount: Medium (34-66%) N/A N/A Granulation Quality: Red, Hyper-granulation N/A N/A Edward Mccoy, FRIMPONG. ([id number]) Necrotic Amount: Small (1-33%) N/A N/A Periwound Skin Texture: Edema: No N/A N/A Excoriation: No Induration: No Callus: No Crepitus: No Fluctuance: No Friable: No Rash: No Scarring: No Periwound Skin Moist: Yes N/A N/A Moisture: Maceration: No Dry/Scaly: No Periwound Skin Color: Erythema: Yes N/A N/A Atrophie Blanche: No Cyanosis: No Ecchymosis: No Hemosiderin Staining: No Mottled: No Pallor: No Rubor: No Erythema Location: Circumferential N/A N/A Temperature: Hot  N/A N/A Tenderness on No N/A N/A Palpation: Wound Preparation: Ulcer Cleansing: N/A N/A Rinsed/Irrigated with Saline Topical Anesthetic Applied: Other: lidocaine 4% Treatment Notes Electronic Signature(s) Signed: 05/22/2015 5:34:28 PM By: Gretta Cool, RN, BSN, Kim RN, BSN Entered By: Gretta Cool, RN, BSN, Kim on 05/22/2015 11:22:37 Edward Mccoy, Edward Mccoy ([id number]) -------------------------------------------------------------------------------- [place] Details Patient Name: SIDDIQ, KALUZNY Date of Service: 05/22/2015 10:45 AM Medical Record Number: [id number] Patient Account Number: [id number] Date of Birth/Sex: [birth year] (63 y.o. Male) Treating RN: Cornell Barman Primary Care Physician: Cranford Mon, Delfino Lovett Other Clinician: Referring Physician: Leanor Kail Treating Physician/Extender: Frann Rider in Treatment: 0 Active Inactive Orientation to the Wound Care Program Nursing Diagnoses: Knowledge deficit related to the wound healing center  program Goals: Patient/caregiver will verbalize understanding of the [place] Program Date Initiated: 05/22/2015 Goal Status: Active Interventions: Provide education on orientation to the wound center Notes: Soft Tissue Infection Nursing Diagnoses: Potential for infection: soft tissue Goals: Patient's soft tissue infection will resolve Date Initiated: 05/22/2015 Goal Status: Active Signs and symptoms of infection will be recognized early to allow for prompt treatment Date Initiated: 05/22/2015 Goal Status: Active Interventions: Assess signs and symptoms of infection every visit Treatment Activities: Culture and sensitivity : 05/22/2015 Test ordered outside of clinic : 05/22/2015 Notes: Edward Mccoy, Edward Mccoy ([id number]) Wound/Skin Impairment Nursing Diagnoses: Knowledge deficit related to ulceration/compromised skin integrity Goals: Ulcer/skin breakdown will heal within 14 weeks Date Initiated: 05/22/2015 Goal Status: Active Interventions: Assess patient/caregiver ability to obtain necessary supplies Treatment Activities: Topical wound management initiated : 05/22/2015 Notes: Electronic Signature(s) Signed: 05/22/2015 5:34:28 PM By: Gretta Cool, RN, BSN, Kim RN, BSN Entered By: Gretta Cool, RN, BSN, Kim on 05/22/2015 11:10:58 Edward Mccoy, Edward Mccoy ([id number]) -------------------------------------------------------------------------------- Pain Assessment Details Patient Name: Edward Mccoy Date of Service: 05/22/2015 10:45 AM Medical Record Number: [id number] Patient Account Number: [id number] Date of Birth/Sex: 09/04/1950 (63 y.o. Male) Treating RN: Cornell Barman Primary Care Physician: Cranford Mon, Delfino Lovett Other Clinician: Referring Physician: Leanor Kail Treating Physician/Extender: Frann Rider in Treatment: 0 Active Problems Location of Pain Severity and Description of Pain Patient Has Paino Yes Site Locations Pain Location: Pain in Ulcers With Dressing  Change: Yes Duration of the Pain. Constant / Intermittento Intermittent Character of Pain Describe the Pain: Sharp, Tender, Throbbing Pain Management and Medication Current Pain Management: Electronic Signature(s) Signed: 05/22/2015 5:34:28 PM By: Gretta Cool, RN, BSN, Kim RN, BSN Entered By: Gretta Cool, RN, BSN, Kim on 05/22/2015 10:49:17 Edward Mccoy, Edward Mccoy ([id number]) -------------------------------------------------------------------------------- Patient/Caregiver Education Details Patient Name: Edward Mccoy Date of Service: 05/22/2015 10:45 AM Medical Record Number: [id number] Patient Account Number: [id number] Date of Birth/Gender: 1951-05-30 (63 y.o. Male) Treating RN: Cornell Barman Primary Care Physician: Cranford Mon, Delfino Lovett Other Clinician: Referring Physician: Leanor Kail Treating Physician/Extender: Frann Rider in Treatment: 0 Education Assessment Education Provided To: Patient Education Topics Provided Wound/Skin Impairment: Handouts: Caring for Your Ulcer, Skin Care Do's and Dont's Methods: Demonstration, Explain/Verbal Responses: State content correctly Electronic Signature(s) Signed: 05/22/2015 5:34:28 PM By: Gretta Cool, RN, BSN, Kim RN, BSN Entered By: Gretta Cool, RN, BSN, Kim on 05/22/2015 11:34:34 Edward Mccoy, Edward Mccoy ([id number]) -------------------------------------------------------------------------------- Wound Assessment Details Patient Name: Edward Mccoy Date of Service: 05/22/2015 10:45 AM Medical Record Number: [id number] Patient Account Number: [id number] Date of Birth/Sex: 1951-06-12 (63 y.o. Male) Treating RN: Cornell Barman Primary Care Physician: Cranford Mon, Delfino Lovett Other Clinician: Referring Physician: Leanor Kail Treating Physician/Extender: Frann Rider in Treatment: 0 Wound Status Wound Number: 1 Primary Etiology: Infection - not elsewhere classified Wound Location: Right Knee  Wound Status: Open Wounding Event: Bump Date Acquired:  05/11/2015 Weeks Of Treatment: 0 Clustered Wound: Yes Photos Wound Measurements Length: (cm) 3 Width: (cm) 3.2 Depth: (cm) 0.4 Area: (cm) 7.54 Volume: (cm) 3.016 % Reduction in Area: 0% % Reduction in Volume: -300% Wound Description Full Thickness Without Exposed Classification: Support Structures Wound Margin: Indistinct, nonvisible Exudate Large Amount: Exudate Type: Serosanguineous Exudate Color: red, brown Wound Bed Granulation Amount: Medium (34-66%) Granulation Quality: Red, Hyper-granulation Necrotic Amount: Small (1-33%) Dreese, DREDEN RIVERE. ([id number]) Necrotic Quality: Adherent Slough Periwound Skin Texture Texture Color No Abnormalities Noted: No No Abnormalities Noted: No Callus: No Atrophie Blanche: No Crepitus: No Cyanosis: No Excoriation: No Ecchymosis: No Fluctuance: No Erythema: Yes Friable: No Erythema Location: Circumferential Induration: No Hemosiderin Staining: No Localized Edema: No Mottled: No Rash: No Pallor: No Scarring: No Rubor: No Moisture Temperature / Pain No Abnormalities Noted: No Temperature: Hot Dry / Scaly: No Maceration: No Moist: Yes Wound Preparation Ulcer Cleansing: Rinsed/Irrigated with Saline Topical Anesthetic Applied: Other: lidocaine 4%, Electronic Signature(s) Signed: 05/22/2015 5:34:28 PM By: Gretta Cool, RN, BSN, Kim RN, BSN Entered By: Gretta Cool, RN, BSN, Kim on 05/22/2015 11:57:58 Leino, Edward Mccoy ([id number]) -------------------------------------------------------------------------------- [place] Details Patient Name: Edward Mccoy Date of Service: 05/22/2015 10:45 AM Medical Record Number: [id number] Patient Account Number: [id number] Date of Birth/Sex: January 21, 1951 (63 y.o. Male) Treating RN: Cornell Barman Primary Care Physician: Cranford Mon, Delfino Lovett Other Clinician: Referring Physician: Leanor Kail Treating Physician/Extender: Frann Rider in Treatment: 0 Vital Signs Time Taken: 10:49 Temperature  (F): 98.1 Height (in): 70 Pulse (bpm): 76 Weight (lbs): 195 Respiratory Rate (breaths/min): 18 Body Mass Index (BMI): 28 Blood Pressure (mmHg): 129/87 Reference Range: 80 - 120 mg / dl Electronic Signature(s) Signed: 05/22/2015 5:34:28 PM By: Gretta Cool, RN, BSN, Kim RN, BSN Entered By: Gretta Cool, RN, BSN, Kim on 05/22/2015 10:50:10

## 2015-05-23 NOTE — Progress Notes (Signed)
Mccoy, Edward ([id number]) Visit Report for 05/22/2015 Chief Complaint Document Details Patient Name: Edward Mccoy, Edward Mccoy 05/22/2015 10:45 Date of Service: AM Medical Record [id number] Number: Patient Account Number: [id number] 20-Apr-1951 (64 y.o. Treating RN: Cornell Barman Date of Birth/Sex: Male) Other Clinician: Primary Care Physician: Cranford Mon, RICHARD Treating Christin Fudge Referring Physician: Leanor Kail Physician/Extender: Suella Grove in Treatment: 0 Information Obtained from: Patient Chief Complaint Patient presents to the wound care center for a consult due non healing wound. Patient has an ulcerated reddened area on the right knee For about 2 weeks now. Electronic Signature(s) Signed: 05/22/2015 12:16:36 PM By: Christin Fudge MD, FACS Entered By: Christin Fudge on 05/22/2015 12:16:36 Donnan, [name] ([id number]) -------------------------------------------------------------------------------- Debridement Details Patient Name: Mccoy, Edward 05/22/2015 10:45 Date of Service: AM Medical Record [id number] Number: Patient Account Number: [id number] 01-Mar-1951 (64 y.o. Treating RN: Cornell Barman Date of Birth/Sex: Male) Other Clinician: Primary Care Physician: Cranford Mon, RICHARD Treating Donye Campanelli Referring Physician: Leanor Kail Physician/Extender: Suella Grove in Treatment: 0 Debridement Performed for Wound #1 Right,Medial Knee Assessment: Performed By: Physician Christin Fudge, MD Debridement: Debridement Pre-procedure Yes Verification/Time Out Taken: Start Time: 11:28 Pain Control: Other : lidocaine 4% Level: Skin/Subcutaneous Tissue Total Area Debrided (L x 3 (cm) x 3.2 (cm) = 9.6 (cm) W): Tissue and other Viable, Non-Viable, Eschar, Exudate, Skin, Subcutaneous material debrided: Instrument: Forceps Bleeding: Minimum Hemostasis Achieved: Pressure End Time: 11:29 Procedural Pain: 3 Post Procedural Pain: 1 Response to Treatment: Procedure was tolerated  well Post Debridement Measurements of Total Wound Length: (cm) 3 Width: (cm) 3.2 Depth: (cm) 0.4 Volume: (cm) 3.016 Post Procedure Diagnosis Same as Pre-procedure Electronic Signature(s) Signed: 05/22/2015 12:16:11 PM By: Christin Fudge MD, FACS Signed: 05/22/2015 5:34:28 PM By: Gretta Cool RN, BSN, Kim RN, BSN Entered By: Christin Fudge on 05/22/2015 12:16:10 Butt, [name] ([id number]) -------------------------------------------------------------------------------- HPI Details Patient Name: Mccoy, Edward 05/22/2015 10:45 Date of Service: AM Medical Record [id number] Number: Patient Account Number: [id number] [birth year] (64 y.o. Treating RN: Cornell Barman Date of Birth/Sex: Male) Other Clinician: Primary Care Physician: Cranford Mon, RICHARD Treating Christin Fudge Referring Physician: Leanor Kail Physician/Extender: Suella Grove in Treatment: 0 History of Present Illness Location: Patient has an ulcer on the right knee Quality: Patient reports experiencing a sharp pain to affected area(s). Severity: Patient states wound are getting worse. Duration: Patient has had the wound for < 2 weeks prior to presenting for treatment Timing: Pain in wound is Intermittent (comes and goes Context: The wound occurred when the patient noticed a bump on the right knee which extruded some fluid and then rapidly got red Modifying Factors: Other treatment(s) tried include:he's had Bactrim DS for about a week and now is on Augmentin Associated Signs and Symptoms: Patient reports having increase swelling. HPI Description: pleasant 64 year old gentleman who noticed a small bump on his right knee which he squeezed and then over a period of several hours rapidly noticed redness over this. He was seen by the orthopedic office when they confirmed that there was no evidence of bursitis but treated him with Bactrim for 7 days. He was also given naproxen for pain. The patient was seen back yesterday by the  orthopedic office and changed over to Augmentin and today he saw Dr. Jefm Bryant who asked him to wear a brace to immobilize this and asked him to come and see me. 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 He does not smoke. I  asked him specifically if he noticed any insect bite or worked in an area where it was possible for an inset to bite him but he does not think so. Electronic Signature(s) Signed: 05/22/2015 12:19:40 PM By: Christin Fudge MD, FACS Entered By: Christin Fudge on 05/22/2015 12:19:39 Botkin, SION REINDERS ([id number]) -------------------------------------------------------------------------------- Physical Exam Details Patient Name: Mccoy, Edward 05/22/2015 10:45 Date of Service: AM Medical Record [id number] Number: Patient Account Number: [id number] 23-Sep-1950 (64 y.o. Treating RN: Cornell Barman Date of Birth/Sex: Male) Other Clinician: Primary Care Physician: Cranford Mon, RICHARD Treating Christin Fudge Referring Physician: Leanor Kail Physician/Extender: Weeks in Treatment: 0 Constitutional . Pulse regular. Respirations normal and unlabored. Afebrile. . Eyes Nonicteric. Reactive to light. Ears, Nose, Mouth, and Throat Lips, teeth, and gums WNL.Marland Kitchen Moist mucosa without lesions . Neck supple and nontender. No palpable supraclavicular or cervical adenopathy. Normal sized without goiter. Respiratory WNL. No retractions.. Breath sounds WNL, No rubs, rales, rhonchi, or wheeze.. Cardiovascular Heart rhythm and rate regular, no murmur or gallop.. Pedal Pulses WNL. No clubbing, cyanosis or edema. Gastrointestinal (GI) Abdomen without masses or tenderness.. No liver or spleen enlargement or tenderness.. Lymphatic No adneopathy. No adenopathy. No adenopathy. Musculoskeletal Adexa without tenderness or enlargement.. Digits and nails w/o clubbing, cyanosis, infection, petechiae, ischemia, or  inflammatory conditions.. Integumentary (Hair, Skin) No suspicious lesions. No crepitus or fluctuance. No peri-wound warmth or erythema. No masses.Marland Kitchen Psychiatric Judgement and insight Intact.. No evidence of depression, anxiety, or agitation.. Notes He has an area on the skin of his right knee just over the patella which looks like a carbuncle and has surrounding erythema and dry skin. Some of the openings have necrotic debris at the base and it is all very tender. Electronic Signature(s) Signed: 05/22/2015 12:20:32 PM By: Christin Fudge MD, FACS Entered By: Christin Fudge on 05/22/2015 12:20:31 Townley, ION GONNELLA ([id number]) -------------------------------------------------------------------------------- Physician Orders Details Patient Name: AMJAD, FIKES 05/22/2015 10:45 Date of Service: AM Medical Record [id number] Number: Patient Account Number: [id number] [birth year] (64 y.o. Treating RN: Cornell Barman Date of Birth/Sex: Male) Other Clinician: Primary Care Physician: Cranford Mon, RICHARD Treating Christin Fudge Referring Physician: Leanor Kail Physician/Extender: Suella Grove in Treatment: 0 Verbal / Phone Orders: Yes Clinician: Cornell Barman Read Back and Verified: Yes Diagnosis Coding Wound Cleansing Wound #1 Right Knee o Clean wound with Normal Saline. o May Shower, gently pat wound dry prior to applying new dressing. Anesthetic Wound #1 Right Knee o Topical Lidocaine 4% cream applied to wound bed prior to debridement Primary Wound Dressing Wound #1 Right Knee o Aquacel Ag - back into open areas Secondary Dressing Wound #1 Right Knee o Boardered Foam Dressing Dressing Change Frequency Wound #1 Right Knee o Change dressing every day. Follow-up Appointments Wound #1 Right Knee o Return Appointment in 1 week. Off-Loading Wound #1 Right Knee o Other: - wear orthopeadic splint Additional Orders / Instructions Wound #1 Right Knee o Increase protein  intake. o Activity as tolerated Kniss, JOSEPHANTHONY TINDEL. ([id number]) Medications-please add to medication list. Wound #1 Right Knee o P.O. Antibiotics - continue antibiotics Electronic Signature(s) Signed: 05/22/2015 4:16:00 PM By: Christin Fudge MD, FACS Signed: 05/22/2015 5:34:28 PM By: Gretta Cool RN, BSN, Kim RN, BSN Entered By: Gretta Cool, RN, BSN, Kim on 05/22/2015 11:32:34 Demetrius, [name] ([id number]) -------------------------------------------------------------------------------- Problem List Details Patient Name: LYALL, FACIANE 05/22/2015 10:45 Date of Service: AM Medical Record [id number] Number: Patient Account Number: [id number] Jul 19, 1951 (64 y.o. 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 Treating RN: Cornell Barman Date of Birth/Sex: Male) Other Clinician: Primary Care Physician: Cranford Mon, RICHARD Treating Christin Fudge Referring Physician: Leanor Kail Physician/Extender: Suella Grove in Treatment: 0 Subjective Chief Complaint Information obtained from  Patient Patient presents to the wound care center for a consult due non healing wound. Patient has an ulcerated reddened area on the right knee For about 2 weeks now. History of Present Illness (HPI) The following HPI elements were documented for the patient's wound: Location: Patient has an ulcer on the right knee Quality: Patient reports experiencing a sharp pain to affected area(s). Severity: Patient states wound are getting worse. Duration: Patient has had the wound for < 2 weeks prior to presenting for treatment Timing: Pain in wound is Intermittent (comes and goes Context: The wound occurred when the patient noticed a bump on the right knee which extruded some fluid and then rapidly got red Modifying Factors: Other treatment(s) tried include:he's had Bactrim DS for about a week and now is on Augmentin Associated Signs and Symptoms: Patient reports having increase swelling. pleasant 64 year old gentleman who noticed a small bump on his right knee which he squeezed and then over a period of several hours rapidly noticed redness over this. He was seen by the orthopedic office when they confirmed that there was no evidence of bursitis but treated him with Bactrim for 7 days. He was also given naproxen for pain. The patient was seen back yesterday by the orthopedic office and changed over to Augmentin and today he saw Dr. Jefm Bryant who asked him to wear a brace to immobilize this and asked him to come and see me. 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 He does not smoke. I asked him specifically if he noticed any insect bite or worked in an area where it was possible for an inset to bite him but he does not think so. Wound History Patient presents with 1 open wound that has been present for approximately 2weeks. Patient has been treating wound in the following manner: abx, dry dressing. Laboratory tests have  not been performed in the last month. Patient reportedly has not tested positive for an antibiotic resistant organism. Patient reportedly has not tested positive for osteomyelitis. Patient reportedly has not had testing performed to evaluate RUHAN, BORAK. ([id number]) circulation in the legs. Patient experiences the following problems associated with their wounds: infection. Patient History Information obtained from Patient. Allergies No Known Drug Allergies Family History Cancer - Mother, Diabetes - Father, No family history of Heart Disease, Hypertension, Kidney Disease, Lung Disease, Seizures, Stroke, Thyroid Problems. Social History Never smoker, Marital Status - Married, Alcohol Use - Rarely, Drug Use - No History, Caffeine Use - Daily. Medical History Eyes Denies history of Cataracts, Glaucoma, Optic Neuritis Ear/Nose/Mouth/Throat Denies history of Chronic sinus problems/congestion, Middle ear problems  Hematologic/Lymphatic Denies history of Anemia, Hemophilia, Human Immunodeficiency Virus, Lymphedema, Sickle Cell Disease Respiratory Denies history of Aspiration, Asthma, Chronic Obstructive Pulmonary Disease (COPD), Pneumothorax, Sleep Apnea, Tuberculosis Gastrointestinal Denies history of Cirrhosis , Colitis, Crohn s, Hepatitis A, Hepatitis B, Hepatitis C Endocrine Denies history of Type I Diabetes, Type II Diabetes Genitourinary Denies history of End Stage Renal Disease Immunological Denies history of Lupus Erythematosus, Raynaud s Integumentary (Skin) Denies history of History of Burn, History of pressure wounds Musculoskeletal Denies history of Gout, Rheumatoid Arthritis, Osteoarthritis, Osteomyelitis Neurologic Denies history of Dementia, Neuropathy, Quadriplegia, Paraplegia, Seizure Disorder Oncologic Denies history of Received Chemotherapy, Received Radiation Psychiatric Denies history of Anorexia/bulimia, Confinement Anxiety Review of Systems  (ROS) Constitutional Symptoms (General Health) The patient has no complaints or symptoms. Eyes The patient has no complaints or symptoms. RONTE, PARKER ([id number]) Ear/Nose/Mouth/Throat The patient has no complaints or symptoms. Hematologic/Lymphatic The patient has no complaints or symptoms. Respiratory The patient has no complaints or symptoms. Gastrointestinal The patient has no complaints or symptoms. Endocrine The patient has no complaints or symptoms. Genitourinary The patient has no complaints or symptoms. Immunological The patient has no complaints or symptoms. Integumentary (Skin) Complains or has symptoms of Wounds. Denies complaints or symptoms of Bleeding or bruising tendency, Breakdown, Swelling. Musculoskeletal The patient has no complaints or symptoms. Neurologic The patient has no complaints or symptoms. Oncologic Prostate cancer-total removal; appendix cancer-removal Psychiatric The patient has no complaints or symptoms. Medications aspirin 81 mg chewable tablet oral 1 1 tablet,chewable oral simvastatin 10 mg tablet oral tablet oral coenzyme Q10 (ubiquinol) 100 mg capsule oral 1 1 capsule oral Cialis 20 mg tablet oral 1 1 tablet oral omega-3 fatty acids-fish oil 360 mg-1,200 mg capsule oral capsule oral multivitamin with iron-mineral tablet oral tablet oral naproxen 500 mg tablet oral 1 1 tablet oral Celebrex 200 mg capsule oral 1 1 capsule oral amoxicillin 875 mg-potassium clavulanate 125 mg tablet oral 1 1 tablet oral ascorbic acid (vitamin C) 1,000 mg tablet oral 1 1 tablet oral vitamin E 400 unit capsule oral 1 1 capsule oral Objective Constitutional Stowers, DEMETRICE COMBES. ([id number]) Pulse regular. Respirations normal and unlabored. Afebrile. Vitals Time Taken: 10:49 AM, Height: 70 in, Weight: 195 lbs, BMI: 28, Temperature: 98.1 F, Pulse: 76 bpm, Respiratory Rate: 18 breaths/min, Blood Pressure: 129/87 mmHg. Eyes Nonicteric. Reactive to light. Ears,  Nose, Mouth, and Throat Lips, teeth, and gums WNL.Marland Kitchen Moist mucosa without lesions . Neck supple and nontender. No palpable supraclavicular or cervical adenopathy. Normal sized without goiter. Respiratory WNL. No retractions.. Breath sounds WNL, No rubs, rales, rhonchi, or wheeze.. Cardiovascular Heart rhythm and rate regular, no murmur or gallop.. Pedal Pulses WNL. No clubbing, cyanosis or edema. Gastrointestinal (GI) Abdomen without masses or tenderness.. No liver or spleen enlargement or tenderness.. Lymphatic No adneopathy. No adenopathy. No adenopathy. Musculoskeletal Adexa without tenderness or enlargement.. Digits and nails w/o clubbing, cyanosis, infection, petechiae, ischemia, or inflammatory conditions.Marland Kitchen Psychiatric Judgement and insight Intact.. No evidence of depression, anxiety, or agitation.. General Notes: He has an area on the skin of his right knee just over the patella which looks like a carbuncle and has surrounding erythema and dry skin. Some of the openings have necrotic debris at the base and it is all very tender. Integumentary (Hair, Skin) No suspicious lesions. No crepitus or fluctuance. No peri-wound warmth or erythema. No masses.. Wound #1 status is Open. Original cause of wound was Bump. The wound is located on the Right,Medial Knee. The wound measures 3cm length x  3.2cm width x 0.4cm depth; 7.54cm^2 area and 3.016cm^3 volume. There is a large amount of serosanguineous drainage noted. The wound margin is indistinct and nonvisible. There is medium (34-66%) red granulation within the wound bed. There is a small (1-33%) amount of necrotic tissue within the wound bed including Adherent Slough. The periwound skin appearance exhibited: Moist, Erythema. The periwound skin appearance did not exhibit: Callus, Crepitus, Excoriation, Fluctuance, Friable, Induration, Localized Edema, Rash, Scarring, Dry/Scaly, Maceration, Atrophie Blanche, Cyanosis, Ecchymosis, Hemosiderin  Staining, Mottled, Pallor, Rubor. The surrounding wound skin color is [place], DWON SKY. ([id number]) noted with erythema which is circumferential. Periwound temperature was noted as Hot. Assessment Active Problems ICD-10 L03.115 - Cellulitis of right lower limb S80.261A - Insect bite (nonvenomous), right knee, initial encounter L97.112 - Non-pressure chronic ulcer of right thigh with fat layer exposed This 64 year old gentleman who is otherwise quite healthy has had a sudden in full swelling and ulceration of the skin over his right knee. This looks like a carbuncle and it is very much like a spider bite. He does not confirm an insect bite though. The patient is on a second course of antibiotic and at some stage may need sharp debridement to the OR. At present I have recommended silver alginate to be packed into some of the openings and also cover the wound and this can be changed on alternate days. I will put in a call to his orthopedic surgeon Dr. Jefm Bryant and discussed the management with him. He should return to see me in the wound clinic next week. Procedures Wound #1 Wound #1 is a Necrotizing Infection located on the Right,Medial Knee . There was a Skin/Subcutaneous Tissue Debridement ([id number]) debridement with total area of 9.6 sq cm performed by Christin Fudge, MD. with the following instrument(s): Forceps to remove Viable and Non-Viable tissue/material including Exudate, Eschar, Skin, and Subcutaneous after achieving pain control using Other (lidocaine 4%). A time out was conducted prior to the start of the procedure. A Minimum amount of bleeding was controlled with Pressure. The procedure was tolerated well with a pain level of 3 throughout and a pain level of 1 following the procedure. Post Debridement Measurements: 3cm length x 3.2cm width x 0.4cm depth; 3.016cm^3 volume. Post procedure Diagnosis Wound #1: Same as Pre-Procedure ANDREUS, CURE. ([id number]) Plan Wound  Cleansing: Wound #1 Right Knee: Clean wound with Normal Saline. May Shower, gently pat wound dry prior to applying new dressing. Anesthetic: Wound #1 Right Knee: Topical Lidocaine 4% cream applied to wound bed prior to debridement Primary Wound Dressing: Wound #1 Right Knee: Aquacel Ag - back into open areas Secondary Dressing: Wound #1 Right Knee: Boardered Foam Dressing Dressing Change Frequency: Wound #1 Right Knee: Change dressing every day. Follow-up Appointments: Wound #1 Right Knee: Return Appointment in 1 week. Off-Loading: Wound #1 Right Knee: Other: - wear orthopeadic splint Additional Orders / Instructions: Wound #1 Right Knee: Increase protein intake. Activity as tolerated Medications-please add to medication list.: Wound #1 Right Knee: P.O. Antibiotics - continue antibiotics This 64 year old gentleman who is otherwise quite healthy has had a sudden in full swelling and ulceration of the skin over his right knee. This looks like a carbuncle and it is very much like a spider bite. He does not confirm an insect bite though. The patient is on a second course of antibiotic and at some stage may need sharp debridement to the OR. At present I have recommended silver alginate to be packed into some of the openings and also cover  the wound and this can be changed on alternate days. I will put in a call to his orthopedic surgeon Dr. Jefm Bryant and discussed the management with him. He should return to see me in the wound clinic next week. CHRISTAIN, NIZNIK ([id number]) Electronic Signature(s) Signed: 05/22/2015 12:24:53 PM By: Christin Fudge MD, FACS Entered By: Christin Fudge on 05/22/2015 12:24:53 Myrick, TOBIE HELLEN ([id number]) -------------------------------------------------------------------------------- ROS/PFSH Details Patient Name: CORDARREL, STIEFEL 05/22/2015 10:45 Date of Service: AM Medical Record [id number] Number: Patient Account Number: [id number] 17-Sep-1950 (64  y.o. Treating RN: Cornell Barman Date of Birth/Sex: Male) Other Clinician: Primary Care Physician: Cranford Mon, RICHARD Treating Ardit Danh Referring Physician: Leanor Kail Physician/Extender: Suella Grove in Treatment: 0 Information Obtained From Patient Wound History Do you currently have one or more open woundso Yes How many open wounds do you currently haveo 1 Approximately how long have you had your woundso 2weeks How have you been treating your wound(s) until nowo abx, dry dressing Has your wound(s) ever healed and then re-openedo No Have you had any lab work done in the past montho No Have you tested positive for an antibiotic resistant organism (MRSA, VRE)o No Have you tested positive for osteomyelitis (bone infection)o No Have you had any tests for circulation on your legso No Have you had other problems associated with your woundso Infection Integumentary (Skin) Complaints and Symptoms: Positive for: Wounds Negative for: Bleeding or bruising tendency; Breakdown; Swelling Medical History: Negative for: History of Burn; History of pressure wounds Musculoskeletal Complaints and Symptoms: No Complaints or Symptoms Complaints and Symptoms: Negative for: Muscle Pain Medical History: Negative for: Gout; Rheumatoid Arthritis; Osteoarthritis; Osteomyelitis Constitutional Symptoms (General Health) Complaints and Symptoms: No Complaints or Symptoms Mcintyre, AMAHRI DENGEL. ([id number]) Eyes Complaints and Symptoms: No Complaints or Symptoms Medical History: Negative for: Cataracts; Glaucoma; Optic Neuritis Ear/Nose/Mouth/Throat Complaints and Symptoms: No Complaints or Symptoms Medical History: Negative for: Chronic sinus problems/congestion; Middle ear problems Hematologic/Lymphatic Complaints and Symptoms: No Complaints or Symptoms Medical History: Negative for: Anemia; Hemophilia; Human Immunodeficiency Virus; Lymphedema; Sickle Cell Disease Respiratory Complaints and  Symptoms: No Complaints or Symptoms Medical History: Negative for: Aspiration; Asthma; Chronic Obstructive Pulmonary Disease (COPD); Pneumothorax; Sleep Apnea; Tuberculosis Gastrointestinal Complaints and Symptoms: No Complaints or Symptoms Medical History: Negative for: Cirrhosis ; Colitis; Crohnos; Hepatitis A; Hepatitis B; Hepatitis C Endocrine Complaints and Symptoms: No Complaints or Symptoms Medical History: Negative for: Type I Diabetes; Type II Diabetes Genitourinary Complaints and Symptoms: No Complaints or Symptoms Vaquera, BLANTON KARDELL ([id number]) Medical History: Negative for: End Stage Renal Disease Immunological Complaints and Symptoms: No Complaints or Symptoms Medical History: Negative for: Lupus Erythematosus; Raynaudos Neurologic Complaints and Symptoms: No Complaints or Symptoms Medical History: Negative for: Dementia; Neuropathy; Quadriplegia; Paraplegia; Seizure Disorder Oncologic Complaints and Symptoms: Review of System Notes: Prostate cancer-total removal; appendix cancer-removal Medical History: Negative for: Received Chemotherapy; Received Radiation Psychiatric Complaints and Symptoms: No Complaints or Symptoms Medical History: Negative for: Anorexia/bulimia; Confinement Anxiety Immunizations Tetanus Vaccine: Last tetanus shot: 07/05/2011 Family and Social History Cancer: Yes - Mother; Diabetes: Yes - Father; Heart Disease: No; Hypertension: No; Kidney Disease: No; Lung Disease: No; Seizures: No; Stroke: No; Thyroid Problems: No; Never smoker; Marital Status - Married; Alcohol Use: Rarely; Drug Use: No History; Caffeine Use: Daily; Financial Concerns: No; Food, Clothing or Shelter Needs: No; Support System Lacking: No; Transportation Concerns: No; Advanced Directives: Yes (Not Provided); Patient does not want information on Advanced Directives; Living Will: Yes (Not Provided); Medical Power of Attorney: Yes (Not Provided) Physician Affirmation I  have reviewed and agree with the above information. SHAQUIL, ALDANA ([id number]) Electronic Signature(s) Signed: 05/22/2015 12:21:26 PM By: Christin Fudge MD, FACS Signed: 05/22/2015 5:34:28 PM By: Gretta Cool, RN, BSN, Kim RN, BSN Entered By: Christin Fudge on 05/22/2015 74:16:38 CHRISOPHER, PUSTEJOVSKY ([id number]) -------------------------------------------------------------------------------- [facility] Details Patient Name: GREGOIRE, BENNIS Date of Service: 05/22/2015 Medical Record Number: [id number] Patient Account Number: [id number] Date of Birth/Sex: 17-Oct-1950 (63 y.o. 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 Facility Procedures CPT4 Code Description: [id number] [place] VISIT-LEV 3 EST PT Modifier: Quantity: 1 CPT4 Code Description: 91694503 11042 - DEB SUBQ TISSUE 20 SQ CM/< ICD-10 Description Diagnosis L03.115 Cellulitis of right lower limb S80.261A Insect bite (nonvenomous), right knee, initial enc L97.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 with fat Modifier: ounter layer exposed Quantity: 1 Physician Procedures CPT4 Code Description: 8882800 34917 - WC PHYS LEVEL 4 - NEW PT ICD-10 Description Diagnosis L03.115 Cellulitis of right lower limb S80.261A Insect bite (nonvenomous), right knee, initial enc L97.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

## 2015-05-29 ENCOUNTER — Encounter: Payer: 59 | Admitting: Surgery

## 2015-05-29 DIAGNOSIS — L97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 with fat layer exposed: Secondary | ICD-10-CM | POA: Diagnosis not present

## 2015-05-31 NOTE — Progress Notes (Signed)
HAYS, DUNNIGAN ([id number]) Visit Report for 05/29/2015 Chief Complaint Document Details Patient Name: Edward Mccoy, Edward Mccoy Date of Service: 05/29/2015 1:30 PM Medical Record Number: [id number] Patient Account Number: [id number] Date of Birth/Sex: 16-Jan-1951 (64 y.o. Male) Treating RN: Cornell Barman Primary Care Physician: Cranford Mon, Delfino Lovett Other Clinician: Referring Physician: [name] Treating Physician/Extender: Frann Rider in Treatment: 1 Information Obtained from: Patient Chief Complaint Patient presents to the wound care center for a consult due non healing wound. Patient has an ulcerated reddened area on the right knee For about 2 weeks now. Electronic Signature(s) Signed: 05/29/2015 1:57:20 PM By: Christin Fudge MD, FACS Entered By: Christin Fudge on 05/29/2015 13:57:20 [place], [name] ([id number]) -------------------------------------------------------------------------------- Debridement Details Patient Name: Edward Mccoy Date of Service: 05/29/2015 1:30 PM Medical Record Number: [id number] Patient Account Number: [id number] Date of Birth/Sex: April 23, 1951 (64 y.o. Male) Treating RN: Cornell Barman Primary Care Physician: Cranford Mon, Delfino Lovett Other Clinician: Referring Physician: [name] Treating Physician/Extender: Frann Rider in Treatment: 1 Debridement Performed for Wound #1 Right,Medial Knee Assessment: Performed By: Physician Christin Fudge, MD Debridement: Debridement Pre-procedure Yes Verification/Time Out Taken: Start Time: 13:45 Pain Control: Other : lidocaine 4% Level: Skin/Subcutaneous Tissue Total Area Debrided (L x 2 (cm) x 2.2 (cm) = 4.4 (cm) W): Tissue and other Viable, Non-Viable, Exudate, Fat, Fibrin/Slough, Subcutaneous material debrided: Instrument: Forceps, Scissors Bleeding: Minimum Hemostasis Achieved: Pressure End Time: 13:52 Procedural Pain: 0 Post Procedural Pain: 0 Response to Treatment: Procedure was  tolerated well Post Debridement Measurements of Total Wound Length: (cm) 2 Width: (cm) 2.2 Depth: (cm) 0.5 Volume: (cm) 1.728 Post Procedure Diagnosis Same as Pre-procedure Electronic Signature(s) Signed: 05/29/2015 1:57:14 PM By: Christin Fudge MD, FACS Signed: 05/30/2015 5:15:26 PM By: Gretta Cool RN, BSN, Kim RN, BSN Entered By: Christin Fudge on 05/29/2015 13:57:13 [place], [name] ([id number]) -------------------------------------------------------------------------------- HPI Details Patient Name: Edward Mccoy Date of Service: 05/29/2015 1:30 PM Medical Record Number: [id number] Patient Account Number: [id number] Date of Birth/Sex: November 20, 1950 (64 y.o. Male) Treating RN: Cornell Barman Primary Care Physician: Cranford Mon, Delfino Lovett Other Clinician: Referring Physician: [name] Treating Physician/Extender: Frann Rider in Treatment: 1 History of Present Illness Location: Patient has an ulcer on the right knee Quality: Patient reports experiencing a sharp pain to affected area(s). Severity: Patient states wound are getting worse. Duration: Patient has had the wound for < 2 weeks prior to presenting for treatment Timing: Pain in wound is Intermittent (comes and goes Context: The wound occurred when the patient noticed a bump on the right knee which extruded some fluid and then rapidly got red Modifying Factors: Other treatment(s) tried include:he's had Bactrim DS for about a week and now is on Augmentin Associated Signs and Symptoms: Patient reports having increase swelling. HPI Description: pleasant 64 year old gentleman who noticed a small bump on his right knee which he squeezed and then over a period of several hours rapidly noticed redness over this. He was seen by the orthopedic office when they confirmed that there was no evidence of bursitis but treated him with Bactrim for 7 days. He was also given naproxen for pain. The patient was seen back yesterday by the  orthopedic office and changed over to Augmentin and today he saw Dr. Jefm Bryant who asked him to wear a brace to immobilize this and asked him to come and see me. 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 He  does not smoke. I asked him specifically if he noticed any insect bite or worked in an area where it was possible for an inset to bite him but he does not think so. 05/29/2015 -- I had spoken to his surgeon Dr. Jefm Bryant last week who had no problem and knee referring him to a surgeon if required. The patient also grew an MRSA which was sensitive to tetracycline and I'm going to recommend doxycycline for him for 2 weeks. Electronic Signature(s) Signed: 05/29/2015 1:58:27 PM By: Christin Fudge MD, FACS Entered By: Christin Fudge on 05/29/2015 13:58:27 [place], [name] ([id number]) -------------------------------------------------------------------------------- Physical Exam Details Patient Name: Edward Mccoy Date of Service: 05/29/2015 1:30 PM Medical Record Number: [id number] Patient Account Number: [id number] Date of Birth/Sex: 09-30-1950 (64 y.o. Male) Treating RN: Cornell Barman Primary Care Physician: Cranford Mon, Delfino Lovett Other Clinician: Referring Physician: Cranford Mon, RICHARD Treating Physician/Extender: Frann Rider in Treatment: 1 Constitutional . Pulse regular. Respirations normal and unlabored. Afebrile. . Eyes Nonicteric. Reactive to light. Ears, Nose, Mouth, and Throat Lips, teeth, and gums WNL.Marland Kitchen Moist mucosa without lesions . Neck supple and nontender. No palpable supraclavicular or cervical adenopathy. Normal sized without goiter. Respiratory WNL. No retractions.. Cardiovascular Pedal Pulses WNL. No clubbing, cyanosis or edema. Lymphatic No adneopathy. No adenopathy. No adenopathy. Musculoskeletal Adexa without tenderness or enlargement.. Digits and nails w/o clubbing, cyanosis,  infection, petechiae, ischemia, or inflammatory conditions.. Integumentary (Hair, Skin) No suspicious lesions. No crepitus or fluctuance. No peri-wound warmth or erythema. No masses.Marland Kitchen Psychiatric Judgement and insight Intact.. No evidence of depression, anxiety, or agitation.. Notes The right knee has less cellulitis and the entire carbuncle has gone down nicely with minimal slough and debris which will be sharply dissected in the office today with forceps and scissors. Electronic Signature(s) Signed: 05/29/2015 1:59:03 PM By: Christin Fudge MD, FACS Entered By: Christin Fudge on 05/29/2015 13:59:03 [place], [name] ([id number]) -------------------------------------------------------------------------------- Physician Orders Details Patient Name: Edward Mccoy Date of Service: 05/29/2015 1:30 PM Medical Record Number: [id number] Patient Account Number: [id number] Date of Birth/Sex: September 01, 1950 (64 y.o. Male) Treating RN: Cornell Barman Primary Care Physician: Cranford Mon, Delfino Lovett Other Clinician: Referring Physician: Cranford Mon, RICHARD Treating Physician/Extender: Frann Rider in Treatment: 1 Verbal / Phone Orders: No Diagnosis Coding Patient Medications Allergies: No Known Drug Allergies Notifications Medication Indication Start End doxycycline hyclate 05/29/2015 DOSE 1 - oral 100 mg capsule - 1 capsule oral bid Electronic Signature(s) Signed: 05/29/2015 1:56:42 PM By: Christin Fudge MD, FACS Entered By: Christin Fudge on 05/29/2015 13:56:42 [place], [name] ([id number]) -------------------------------------------------------------------------------- Problem List Details Patient Name: Edward Mccoy Date of Service: 05/29/2015 1:30 PM Medical Record Number: [id number] Patient Account Number: [id number] Date of Birth/Sex: Dec 19, 1950 (64 y.o. 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 Male) Treating RN: Cornell Barman Primary Care Physician: [name] Other Clinician:  Referring Physician: Cranford Mon, Delfino Lovett Treating Physician/Extender: Frann Rider in Treatment: 1 Subjective Chief Complaint Information obtained from Patient Patient presents to the wound care center for a consult due non healing wound. Patient has an ulcerated reddened area on the right knee For about 2 weeks now. History of Present Illness (HPI) The following HPI elements were documented for the patient's wound: Location: Patient has an ulcer on the right knee Quality: Patient reports experiencing a sharp pain to affected area(s). Severity: Patient states wound are getting worse. Duration: Patient has had the wound for < 2 weeks prior to presenting for treatment Timing: Pain in wound is Intermittent (comes and goes Context: The wound occurred when the patient noticed a bump on the right knee which extruded some fluid and then rapidly got red Modifying Factors: Other treatment(s) tried include:he's had Bactrim DS for about a week and now is on Augmentin Associated Signs  and Symptoms: Patient reports having increase swelling. pleasant 64 year old gentleman who noticed a small bump on his right knee which he squeezed and then over a period of several hours rapidly noticed redness over this. He was seen by the orthopedic office when they confirmed that there was no evidence of bursitis but treated him with Bactrim for 7 days. He was also given naproxen for pain. The patient was seen back yesterday by the orthopedic office and changed over to Augmentin and today he saw Dr. Jefm Bryant who asked him to wear a brace to immobilize this and asked him to come and see me. 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 He does not smoke. I asked him specifically if he noticed any insect bite or worked in an area where it was possible for an inset to bite him but he does not think so. 05/29/2015 -- I had spoken to his surgeon Dr. Jefm Bryant last week who had no problem and knee referring him to a surgeon if required. The patient also grew an MRSA which was sensitive to tetracycline and I'm going to recommend doxycycline for him for 2 weeks. WESSON, STITH ([id number]) Objective Constitutional Pulse regular. Respirations normal and unlabored. Afebrile. Vitals Time Taken: 1:34 AM, Height: 70 in, Weight: 195 lbs, BMI: 28, Temperature: 97.8 F, Pulse: 59 bpm, Respiratory Rate: 18 breaths/min, Blood Pressure: 135/69 mmHg. Eyes Nonicteric. Reactive to light. Ears, Nose, Mouth, and Throat Lips, teeth, and gums WNL.Marland Kitchen Moist mucosa without lesions . Neck supple and nontender. No palpable supraclavicular or cervical adenopathy. Normal sized without goiter. Respiratory WNL. No retractions.. Cardiovascular Pedal Pulses WNL. No clubbing, cyanosis or edema. Lymphatic No adneopathy. No adenopathy. No adenopathy. Musculoskeletal Adexa without tenderness or enlargement.. Digits and nails w/o clubbing,  cyanosis, infection, petechiae, ischemia, or inflammatory conditions.Marland Kitchen Psychiatric Judgement and insight Intact.. No evidence of depression, anxiety, or agitation.. General Notes: The right knee has less cellulitis and the entire carbuncle has gone down nicely with minimal slough and debris which will be sharply dissected in the office today with forceps and scissors. Integumentary (Hair, Skin) No suspicious lesions. No crepitus or fluctuance. No peri-wound warmth or erythema. No masses.. Wound #1 status is Open. Original cause of wound was Bite. The wound is located on the Right,Medial Knee. The wound measures 2cm length x 2.2cm width x 0.2cm depth; 3.456cm^2 area and 0.691cm^3 volume. There is a large amount of serosanguineous drainage noted. The wound margin is indistinct and nonvisible. There is medium (  34-66%) red granulation within the wound bed. There is a small (1-33%) amount of necrotic tissue within the wound bed including Adherent Slough. The periwound skin appearance exhibited: Moist, Erythema. The periwound skin appearance did not exhibit: Callus, Crepitus, Excoriation, Bodkin, REMIEL CORTI. ([id number]) Fluctuance, Friable, Induration, Localized Edema, Rash, Scarring, Dry/Scaly, Maceration, Atrophie Blanche, Cyanosis, Ecchymosis, Hemosiderin Staining, Mottled, Pallor, Rubor. The surrounding wound skin color is noted with erythema which is circumferential. Periwound temperature was noted as Hot. 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 I prescribed doxycycline 100 mg by mouth twice a day for 2 weeks. He will not require surgical debridement but we will continue to monitor him in the office. Procedures Wound #1 Wound #1 is a  Necrotizing Infection located on the Right,Medial Knee . There was a Skin/Subcutaneous Tissue Debridement ([id number]) debridement with total area of 4.4 sq cm performed by Christin Fudge, MD. with the following instrument(s): Forceps and Scissors to remove Viable and Non-Viable tissue/material including Exudate, Fat, Fibrin/Slough, and Subcutaneous after achieving pain control using Other (lidocaine 4%). A time out was conducted prior to the start of the procedure. A Minimum amount of bleeding was controlled with Pressure. The procedure was tolerated well with a pain level of 0 throughout and a pain level of 0 following the procedure. Post Debridement Measurements: 2cm length x 2.2cm width x 0.5cm depth; 1.728cm^3 volume. Post procedure Diagnosis Wound #1: Same as Pre-Procedure Plan Wound Cleansing: Wound #1 Right,Medial Knee: Clean wound with Normal Saline. SHAHEER, BONFIELD ([id number]) May Shower, gently pat wound dry prior to applying new dressing. Anesthetic: Wound #1 Right,Medial Knee: Topical Lidocaine 4% cream applied to wound bed prior to debridement Primary Wound Dressing: Wound #1 Right,Medial Knee: Aquacel Ag - pack into open areas Secondary Dressing: Wound #1 Right,Medial Knee: Conform/Kerlix - wrap lightly in [place] Dressing Change Frequency: Wound #1 Right,Medial Knee: Change dressing every day. Follow-up Appointments: Wound #1 Right,Medial Knee: Return Appointment in 1 week. Additional Orders / Instructions: Wound #1 Right,Medial Knee: Increase protein intake. Activity as tolerated Medications-please add to medication list.: Wound #1 Right,Medial Knee: P.O. Antibiotics - Changed Doxycycline Locally we will continue with silver alginate and a bordered foam dressing. I prescribed doxycycline 100 mg by mouth twice a day for 2 weeks. He will not require surgical debridement but we will continue to monitor him in the office. Electronic Signature(s) Signed: 05/29/2015  2:00:02 PM By: Christin Fudge MD, FACS Entered By: Christin Fudge on 05/29/2015 14:00:02 [place]and[place], [name] ([id number]) -------------------------------------------------------------------------------- SuperBill Details Patient Name: Edward Mccoy Date of Service: 05/29/2015 Medical Record Number: [id number] Patient Account Number: [id number] Date of Birth/Sex: Sep 07, 1950 (64 y.o. 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 Facility Procedures CPT4 Code Description: [id number] 11042 - DEB SUBQ TISSUE 20 SQ CM/< ICD-10 Description Diagnosis L03.115 Cellulitis of right lower limb S80.261A Insect bite (nonvenomous), right knee, initial enc L97.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 with fat Modifier: ounter layer exposed Quantity: 1 Physician Procedures CPT4 Code Description: [id number] 09628 - WC  PHYS LEVEL 3 - EST PT ICD-10 Description Diagnosis S80.261A Insect bite (nonvenomous), right knee, initial enc L03.115 Cellulitis of right lower limb L97.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 with fat Modifier: 25 ounter layer exposed Quantity: 1 CPT4 Code Description: 3500938 11042 - WC PHYS SUBQ TISS 20 SQ CM ICD-10 Description Diagnosis L03.115 Cellulitis of right lower limb S80.261A Insect bite (nonvenomous), right knee, initial enc L97.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 with fat Modifier: ounter layer exposed Quantity: 1 Electronic Signature(s) Signed: 05/29/2015 2:00:47 PM By: Christin Fudge MD, FACS [place], [place] ([id number]) Entered By: Christin Fudge on 05/29/2015 14:00:47

## 2015-05-31 NOTE — Progress Notes (Signed)
GRAYER, SPROLES ([id number]) Visit Report for 05/29/2015 Arrival Information Details Patient Name: STEFFAN, CANIGLIA Date of Service: 05/29/2015 1:30 PM Medical Record Number: [id number] Patient Account Number: [id number] Date of Birth/Sex: 1950-11-02 (64 y.o. Male) Treating RN: Cornell Barman Primary Care Physician: Cranford Mon, Delfino Lovett Other Clinician: Referring Physician: Cranford Mon, Delfino Lovett Treating Physician/Extender: Frann Rider in Treatment: 1 Visit Information History Since Last Visit Added or deleted any medications: No Patient Arrived: Ambulatory Any new allergies or adverse reactions: No Arrival Time: 13:33 Had a fall or experienced change in No Accompanied By: wife activities of daily living that may affect Transfer Assistance: None risk of falls: Patient Identification Verified: Yes Signs or symptoms of abuse/neglect since last No Secondary Verification Process Yes visito Completed: Hospitalized since last visit: No Patient Has Alerts: Yes Has Dressing in Place as Prescribed: Yes Patient Alerts: Patient on Blood Pain Present Now: No Thinner 81mg  aspirin Electronic Signature(s) Signed: 05/30/2015 5:15:26 PM By: Gretta Cool, RN, BSN, Kim RN, BSN Entered By: Gretta Cool, RN, BSN, Kim on 05/29/2015 13:34:03 Sherman, [name] ([id number]) -------------------------------------------------------------------------------- Encounter Discharge Information Details Patient Name: [name] Date of Service: 05/29/2015 1:30 PM Medical Record Number: [id number] Patient Account Number: [id number] Date of Birth/Sex: Jun 22, 1951 (64 y.o. Male) Treating RN: Cornell Barman Primary Care Physician: Cranford Mon, Delfino Lovett Other Clinician: Referring Physician: Cranford Mon, RICHARD Treating Physician/Extender: Frann Rider in Treatment: 1 Encounter Discharge Information Items Discharge Pain Level: 0 Discharge Condition: Stable Ambulatory Status: Ambulatory Discharge Destination:  Home Transportation: Private Auto Accompanied By: wife Schedule Follow-up Appointment: Yes Medication Reconciliation completed and provided to Patient/Care Yes Zayli Villafuerte: Provided on Clinical Summary of Care: 05/29/2015 Form Type Recipient Paper Patient JP Electronic Signature(s) Signed: 05/29/2015 2:05:21 PM By: Ruthine Dose Entered By: Ruthine Dose on 05/29/2015 14:05:20 Dauphinais, [name] ([id number]) -------------------------------------------------------------------------------- Lower Extremity Assessment Details Patient Name: [name] Date of Service: 05/29/2015 1:30 PM Medical Record Number: [id number] Patient Account Number: [id number] Date of Birth/Sex: [birth year] (64 y.o. Male) Treating RN: Cornell Barman Primary Care Physician: Cranford Mon, Delfino Lovett Other Clinician: Referring Physician: Cranford Mon, RICHARD Treating Physician/Extender: Frann Rider in Treatment: 1 Vascular Assessment Pulses: Posterior Tibial Dorsalis Pedis Palpable: [Right:Yes] Extremity colors, hair growth, and conditions: Extremity Color: [Right:Normal] Toe Nail Assessment Left: Right: Thick: No Discolored: No Deformed: No Improper Length and Hygiene: No Electronic Signature(s) Signed: 05/30/2015 5:15:26 PM By: Gretta Cool, RN, BSN, Kim RN, BSN Entered By: Gretta Cool, RN, BSN, Kim on 05/29/2015 13:39:38 Speights, [name] ([id number]) -------------------------------------------------------------------------------- Multi Wound Chart Details Patient Name: [name] Date of Service: 05/29/2015 1:30 PM Medical Record Number: [id number] Patient Account Number: [id number] Date of Birth/Sex: 04-13-1951 (64 y.o. Male) Treating RN: Cornell Barman Primary Care Physician: Cranford Mon, Delfino Lovett Other Clinician: Referring Physician: [name] Treating Physician/Extender: Frann Rider in Treatment: 1 Vital Signs Height(in): 70 Pulse(bpm): 59 Weight(lbs): 195 Blood  Pressure 135/69 (mmHg): Body Mass Index(BMI): 28 Temperature(F): 97.8 Respiratory Rate 18 (breaths/min): Photos: [1:No Photos] [N/A:N/A] Wound Location: [1:Right Knee - Medial] [N/A:N/A] Wounding Event: [1:Bite] [N/A:N/A] Primary Etiology: [1:Necrotizing Infection] [N/A:N/A] Secondary Etiology: [1:Infection - not elsewhere classified] [N/A:N/A] Date Acquired: [1:05/11/2015] [N/A:N/A] Weeks of Treatment: [1:1] [N/A:N/A] Wound Status: [1:Open] [N/A:N/A] Clustered Wound: [1:Yes] [N/A:N/A] Measurements L x W x D 2x2.2x0.2 [N/A:N/A] (cm) Area (cm) : [1:3.456] [N/A:N/A] Volume (cm) : [1:0.691] [N/A:N/A] % Reduction in Area: [1:54.20%] [N/A:N/A] % Reduction in Volume: 77.10% [N/A:N/A] Classification: [1:Full Thickness Without Exposed Support Structures] [N/A:N/A] Exudate Amount: [1:Large] [N/A:N/A] Exudate Type: [1:Serosanguineous] [N/A:N/A] Exudate Color: [1:red, brown] [  N/A:N/A] Wound Margin: [1:Indistinct, nonvisible] [N/A:N/A] Granulation Amount: [1:Medium (34-66%)] [N/A:N/A] Granulation Quality: [1:Red, Hyper-granulation] [N/A:N/A] Necrotic Amount: [1:Small (1-33%)] [N/A:N/A] Periwound Skin Texture: Edema: No [1:Excoriation: No Induration: No Callus: No] [N/A:N/A] Crepitus: No Fluctuance: No Friable: No Rash: No Scarring: No Periwound Skin Moist: Yes N/A N/A Moisture: Maceration: No Dry/Scaly: No Periwound Skin Color: Erythema: Yes N/A N/A Atrophie Blanche: No Cyanosis: No Ecchymosis: No Hemosiderin Staining: No Mottled: No Pallor: No Rubor: No Erythema Location: Circumferential N/A N/A Temperature: Hot N/A N/A Tenderness on No N/A N/A Palpation: Wound Preparation: Ulcer Cleansing: N/A N/A Rinsed/Irrigated with Saline Topical Anesthetic Applied: Other: lidocaine 4% Treatment Notes Electronic Signature(s) Signed: 05/30/2015 5:15:26 PM By: Gretta Cool, RN, BSN, Kim RN, BSN Entered By: Gretta Cool, RN, BSN, Kim on 05/29/2015 13:42:31 Talamante, [name]  ([id number]) -------------------------------------------------------------------------------- [place] Details Patient Name: ALTIN, SEASE Date of Service: 05/29/2015 1:30 PM Medical Record Number: [id number] Patient Account Number: [id number] Date of Birth/Sex: 1951/05/27 (64 y.o. Male) Treating RN: Cornell Barman Primary Care Physician: Cranford Mon, Delfino Lovett Other Clinician: Referring Physician: [name] Treating Physician/Extender: Frann Rider in Treatment: 1 Active Inactive Orientation to the Wound Care Program Nursing Diagnoses: Knowledge deficit related to the wound healing center program Goals: Patient/caregiver will verbalize understanding of the [facility] Date Initiated: 05/22/2015 Goal Status: Active Interventions: Provide education on orientation to the wound center Notes: Soft Tissue Infection Nursing Diagnoses: Potential for infection: soft tissue Goals: Patient's soft tissue infection will resolve Date Initiated: 05/22/2015 Goal Status: Active Signs and symptoms of infection will be recognized early to allow for prompt treatment Date Initiated: 05/22/2015 Goal Status: Active Interventions: Assess signs and symptoms of infection every visit Treatment Activities: Culture and sensitivity : 05/29/2015 Test ordered outside of clinic : 05/29/2015 Notes: LAVONTAE, CORNIA ([id number]) Wound/Skin Impairment Nursing Diagnoses: Knowledge deficit related to ulceration/compromised skin integrity Goals: Ulcer/skin breakdown will heal within 14 weeks Date Initiated: 05/22/2015 Goal Status: Active Interventions: Assess patient/caregiver ability to obtain necessary supplies Treatment Activities: Topical wound management initiated : 05/29/2015 Notes: Electronic Signature(s) Signed: 05/30/2015 5:15:26 PM By: Gretta Cool, RN, BSN, Kim RN, BSN Entered By: Gretta Cool, RN, BSN, Kim on 05/29/2015 13:42:23 Olejniczak, [name]  ([id number]) -------------------------------------------------------------------------------- Pain Assessment Details Patient Name: [name] Date of Service: 05/29/2015 1:30 PM Medical Record Number: [id number] Patient Account Number: [id number] Date of Birth/Sex: 1950-10-22 (64 y.o. Male) Treating RN: Cornell Barman Primary Care Physician: Cranford Mon, Delfino Lovett Other Clinician: Referring Physician: [name] Treating Physician/Extender: Frann Rider in Treatment: 1 Active Problems Location of Pain Severity and Description of Pain Patient Has Paino No Site Locations Pain Management and Medication Current Pain Management: Electronic Signature(s) Signed: 05/30/2015 5:15:26 PM By: Gretta Cool, RN, BSN, Kim RN, BSN Entered By: Gretta Cool, RN, BSN, Kim on 05/29/2015 13:34:08 Po, [name] ([id number]) -------------------------------------------------------------------------------- Patient/Caregiver Education Details Patient Name: [name] Date of Service: 05/29/2015 1:30 PM Medical Record Number: [id number] Patient Account Number: [id number] Date of Birth/Gender: [birth year] (64 y.o. Male) Treating RN: Cornell Barman Primary Care Physician: Cranford Mon, Delfino Lovett Other Clinician: Referring Physician: [name] Treating Physician/Extender: Frann Rider in Treatment: 1 Education Assessment Education Provided To: Patient Education Topics Provided Wound/Skin Impairment: Handouts: Caring for Your Ulcer, Other: contuinue wound care as prescribed Methods: Demonstration Responses: State content correctly Electronic Signature(s) Signed: 05/30/2015 5:15:26 PM By: Gretta Cool, RN, BSN, Kim RN, BSN Entered By: Gretta Cool, RN, BSN, Kim on 05/29/2015 13:58:32 Piet, [name] ([id number]) -------------------------------------------------------------------------------- Wound Assessment Details Patient Name: ZAMIR, STAPLES.  Date of Service: 05/29/2015 1:30 PM Medical Record Number:  [id number] Patient Account Number: [id number] Date of Birth/Sex: July 09, 1951 (64 y.o. Male) Treating RN: Cornell Barman Primary Care Physician: Cranford Mon, Delfino Lovett Other Clinician: Referring Physician: Cranford Mon, RICHARD Treating Physician/Extender: Frann Rider in Treatment: 1 Wound Status Wound Number: 1 Primary Etiology: Necrotizing Infection Wound Location: Right Knee - Medial Secondary Infection - not elsewhere Etiology: classified Wounding Event: Bite Wound Status: Open Date Acquired: 05/11/2015 Weeks Of Treatment: 1 Clustered Wound: Yes Photos Photo Uploaded By: Gretta Cool, RN, BSN, Kim on 05/29/2015 17:09:40 Wound Measurements Length: (cm) 2 Width: (cm) 2.2 Depth: (cm) 0.2 Area: (cm) 3.456 Volume: (cm) 0.691 % Reduction in Area: 54.2% % Reduction in Volume: 77.1% Wound Description Full Thickness Without Exposed Classification: Support Structures Wound Margin: Indistinct, nonvisible Exudate Large Amount: Exudate Type: Serosanguineous Exudate Color: red, brown Wound Bed Granulation Amount: Medium (34-66%) Granulation Quality: Red, Hyper-granulation Necrotic Amount: Small (1-33%) Cline, [facility]ORTH [facility]ALDSCHMIDT. ([id number]) Necrotic Quality: Adherent Slough Periwound Skin Texture Texture Color No Abnormalities Noted: No No Abnormalities Noted: No Callus: No Atrophie Blanche: No Crepitus: No Cyanosis: No Excoriation: No Ecchymosis: No Fluctuance: No Erythema: Yes Friable: No Erythema Location: Circumferential Induration: No Hemosiderin Staining: No Localized Edema: No Mottled: No Rash: No Pallor: No Scarring: No Rubor: No Moisture Temperature / Pain No Abnormalities Noted: No Temperature: Hot Dry / Scaly: No Maceration: No Moist: Yes Wound Preparation Ulcer Cleansing: Rinsed/Irrigated with Saline Topical Anesthetic Applied: Other: lidocaine 4%, Treatment Notes Wound #1 (Right, Medial Knee) 1. Cleansed with: Clean wound with Normal Saline 2.  Anesthetic Topical Lidocaine 4% cream to wound bed prior to debridement 4. Dressing Applied: Aquacel Ag 5. Secondary Dressing Applied Kerlix/Conform Notes coban lightly Electronic Signature(s) Signed: 05/30/2015 5:15:26 PM By: Gretta Cool, RN, BSN, Kim RN, BSN Entered By: Gretta Cool, RN, BSN, Kim on 05/29/2015 13:42:03 Mansouri, [name] ([id number]) -------------------------------------------------------------------------------- [place] Details Patient Name: [name] Date of Service: 05/29/2015 1:30 PM Medical Record Number: [id number] Patient Account Number: [id number] Date of Birth/Sex: 04/27/1951 (64 y.o. Male) Treating RN: Cornell Barman Primary Care Physician: Cranford Mon, Delfino Lovett Other Clinician: Referring Physician: [name] Treating Physician/Extender: Frann Rider in Treatment: 1 Vital Signs Time Taken: 01:34 Temperature (F): 97.8 Height (in): 70 Pulse (bpm): 59 Weight (lbs): 195 Respiratory Rate (breaths/min): 18 Body Mass Index (BMI): 28 Blood Pressure (mmHg): 135/69 Reference Range: 80 - 120 mg / dl Electronic Signature(s) Signed: 05/30/2015 5:15:26 PM By: Gretta Cool, RN, BSN, Kim RN, BSN Entered By: Gretta Cool, RN, BSN, Kim on 05/29/2015 13:35:01

## 2015-06-05 ENCOUNTER — Encounter: Payer: 59 | Attending: Surgery | Admitting: Surgery

## 2015-06-05 DIAGNOSIS — X58XXXA Exposure to other specified factors, initial encounter: Secondary | ICD-10-CM | POA: Insufficient documentation

## 2015-06-05 DIAGNOSIS — L03115 Cellulitis of right lower limb: Secondary | ICD-10-CM | POA: Diagnosis not present

## 2015-06-05 DIAGNOSIS — L97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 with fat layer exposed: Secondary | ICD-10-CM | POA: Diagnosis not present

## 2015-06-05 DIAGNOSIS — S80261A Insect bite (nonvenomous), right knee, initial encounter: Secondary | ICD-10-CM | POA: Diagnosis not present

## 2015-06-05 NOTE — Progress Notes (Addendum)
Edward Mccoy, Edward Mccoy ([id number]) Visit Report for 06/05/2015 Chief Complaint Document Details Patient Name: Edward Mccoy, Edward Mccoy Date of Service: 06/05/2015 1:30 PM Medical Record Number: [id number] Patient Account Number: [id number] Date of Birth/Sex: October 11, 1950 (64 y.o. Male) Treating RN: Cornell Barman Primary Care Physician: Cranford Mon, Delfino Lovett Other Clinician: Referring Physician: [name] Treating Physician/Extender: Frann Rider in Treatment: 2 Information Obtained from: Patient Chief Complaint Patient presents to the wound care center for a consult due non healing wound. Patient has an ulcerated reddened area on the right knee For about 2 weeks now. Electronic Signature(s) Signed: 06/05/2015 1:45:41 PM By: Christin Fudge MD, FACS Entered By: Christin Fudge on 06/05/2015 13:45:41 Edward Mccoy, Edward Mccoy ([id number]) -------------------------------------------------------------------------------- HPI Details Patient Name: Edward Mccoy Date of Service: 06/05/2015 1:30 PM Medical Record Number: [id number] Patient Account Number: [id number] Date of Birth/Sex: 1951/06/27 (64 y.o. Male) Treating RN: Cornell Barman Primary Care Physician: Cranford Mon, Delfino Lovett Other Clinician: Referring Physician: [name] Treating Physician/Extender: Frann Rider in Treatment: 2 History of Present Illness Location: Patient has an ulcer on the right knee Quality: Patient reports experiencing a sharp pain to affected area(s). Severity: Patient states wound are getting worse. Duration: Patient has had the wound for < 2 weeks prior to presenting for treatment Timing: Pain in wound is Intermittent (comes and goes Context: The wound occurred when the patient noticed a bump on the right knee which extruded some fluid and then rapidly got red Modifying Factors: Other treatment(s) tried include:he's had Bactrim DS for about a week and now is on Augmentin Associated Signs and Symptoms: Patient reports  having increase swelling. HPI Description: pleasant 64 year old gentleman who noticed a small bump on his right knee which he squeezed and then over a period of several hours rapidly noticed redness over this. He was seen by the orthopedic office when they confirmed that there was no evidence of bursitis but treated him with Bactrim for 7 days. He was also given naproxen for pain. The patient was seen back yesterday by the orthopedic office and changed over to Augmentin and today he saw Dr. Jefm Bryant who asked him to wear a brace to immobilize this and asked him to come and see me. 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 He does not smoke. I asked him specifically if he noticed any insect bite or worked in an area where it was possible for an inset to bite him but he does not think so. 05/29/2015 -- I had spoken to his surgeon Dr. Jefm Bryant last week who had no problem with me referring him to a surgeon if required. The patient also grew an MRSA which was sensitive to tetracycline and I'm going to recommend doxycycline for him for 2 weeks. Electronic Signature(s) Signed: 06/05/2015 1:46:11 PM By: Christin Fudge MD, FACS Entered By: Christin Fudge on 06/05/2015 13:46:10 Edward Mccoy, Edward Mccoy ([id number]) -------------------------------------------------------------------------------- Physical Exam Details Patient Name: Edward Mccoy Date of Service: 06/05/2015 1:30 PM Medical Record Number: [id number] Patient Account Number: [id number] Date of Birth/Sex: 1951-04-24 (64 y.o. Male) Treating RN: Cornell Barman Primary Care Physician: Cranford Mon, Delfino Lovett Other Clinician: Referring Physician: Cranford Mon, RICHARD Treating Physician/Extender: Frann Rider in Treatment: 2 Constitutional . Pulse regular. Respirations normal and unlabored. Afebrile. . Eyes Nonicteric. Reactive to light. Ears, Nose, Mouth, and  Throat Lips, teeth, and gums WNL.Marland Kitchen Moist mucosa without lesions . Neck supple and nontender. No palpable  supraclavicular or cervical adenopathy. Normal sized without goiter. Respiratory WNL. No retractions.. Cardiovascular Pedal Pulses WNL. No clubbing, cyanosis or edema. Lymphatic No adneopathy. No adenopathy. No adenopathy. Musculoskeletal Adexa without tenderness or enlargement.. Digits and nails w/o clubbing, cyanosis, infection, petechiae, ischemia, or inflammatory conditions.. Integumentary (Hair, Skin) No suspicious lesions. No crepitus or fluctuance. No peri-wound warmth or erythema. No masses.Marland Kitchen Psychiatric Judgement and insight Intact.. No evidence of depression, anxiety, or agitation.. Notes wound is completely healed and he has a minimal amount of redness due to the type of dressing he was using. Electronic Signature(s) Signed: 06/05/2015 1:53:56 PM By: Christin Fudge MD, FACS Entered By: Christin Fudge on 06/05/2015 13:53:56 Edward Mccoy, Edward Mccoy ([id number]) -------------------------------------------------------------------------------- Physician Orders Details Patient Name: Edward Mccoy Date of Service: 06/05/2015 1:30 PM Medical Record Number: [id number] Patient Account Number: [id number] Date of Birth/Sex: 1950-09-17 (64 y.o. 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 Discharge From [facility] o Discharge from [place] - treatment complete Electronic Signature(s) Signed: 06/05/2015 4:23:10 PM By: Gretta Cool,  RN, BSN, Kim RN, BSN Signed: 06/05/2015 4:24:37 PM By: Christin Fudge MD, FACS Entered By: Gretta Cool, RN, BSN, Kim on 06/05/2015 13:53:51 Edward Mccoy, Edward Mccoy ([id number]) -------------------------------------------------------------------------------- Problem List Details Patient Name: Edward Mccoy Date of Service: 06/05/2015 1:30 PM Medical Record Number: [id number] Patient Account Number: [id number] Date of Birth/Sex: 07/05/1951 (64 y.o. Male) Treating RN: Cornell Barman Primary Care Physician: Cranford Mon, Delfino Lovett Other Clinician: Referring Physician: Cranford Mon, Delfino Lovett Treating Physician/Extender: Frann Rider in Treatment: 2 Active Problems ICD-10 Encounter Code Description Active Date Diagnosis L03.115 Cellulitis of right lower limb 05/22/2015 Yes S80.261A Insect bite (nonvenomous), right knee, initial encounter 05/22/2015 Yes L97.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 with fat layer 05/22/2015 Yes exposed Inactive Problems Resolved Problems Electronic Signature(s) Signed: 06/05/2015 1:45:35 PM By: Christin Fudge MD, FACS Entered By: Christin Fudge on 06/05/2015 13:45:35 Edward Mccoy, Edward Mccoy ([id number]) -------------------------------------------------------------------------------- Progress Note Details Patient Name: Edward Mccoy Date of Service: 06/05/2015 1:30 PM Medical Record Number: [id number] Patient Account Number: [id number] Date of Birth/Sex: 24-Mar-1951 (64 y.o. Male) Treating RN: Cornell Barman Primary Care Physician: Cranford Mon, Delfino Lovett Other Clinician: Referring Physician: [name] Treating Physician/Extender: Frann Rider in Treatment: 2 Subjective Chief Complaint Information obtained from Patient Patient presents to the wound care center for a consult due non healing wound. Patient has an ulcerated reddened area on the right knee For about 2 weeks now. History of Present Illness (HPI) The following HPI elements were documented for the patient's  wound: Location: Patient has an ulcer on the right knee Quality: Patient reports experiencing a sharp pain to affected area(s). Severity: Patient states wound are getting worse. Duration: Patient has had the wound for < 2 weeks prior to presenting for treatment Timing: Pain in wound is Intermittent (comes and goes Context: The wound occurred when the patient noticed a bump on the right knee which extruded some fluid and then rapidly got red Modifying Factors: Other treatment(s) tried include:he's had Bactrim DS for about a week and now is on Augmentin Associated Signs and Symptoms: Patient reports having increase swelling. pleasant 64 year old gentleman who noticed  a small bump on his right knee which he squeezed and then over a period of several hours rapidly noticed redness over this. He was seen by the orthopedic office when they confirmed that there was no evidence of bursitis but treated him with Bactrim for 7 days. He was also given naproxen for pain. The patient was seen back yesterday by the orthopedic office and changed over to Augmentin and today he saw Dr. Jefm Bryant who asked him to wear a brace to immobilize this and asked him to come and see me. 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 He does not smoke. I asked him specifically if he noticed any insect bite or worked in an area where it was possible for an inset to bite him but he does not think so. 05/29/2015 -- I had spoken to his surgeon Dr. Jefm Bryant last week who had no problem with me referring him to a surgeon if required. The patient also grew an MRSA which was sensitive to tetracycline and I'm going to recommend doxycycline for him for 2 weeks. Edward Mccoy, Edward Mccoy ([id number]) Objective Constitutional Pulse regular. Respirations normal and unlabored. Afebrile. Vitals Time Taken: 1:45 PM, Height: 70 in, Weight: 195 lbs, BMI: 28, Temperature:  98.2 F, Pulse: 56 bpm, Respiratory Rate: 18 breaths/min, Blood Pressure: 112/79 mmHg. Eyes Nonicteric. Reactive to light. Ears, Nose, Mouth, and Throat Lips, teeth, and gums WNL.Marland Kitchen Moist mucosa without lesions . Neck supple and nontender. No palpable supraclavicular or cervical adenopathy. Normal sized without goiter. Respiratory WNL. No retractions.. Cardiovascular Pedal Pulses WNL. No clubbing, cyanosis or edema. Lymphatic No adneopathy. No adenopathy. No adenopathy. Musculoskeletal Adexa without tenderness or enlargement.. Digits and nails w/o clubbing, cyanosis, infection, petechiae, ischemia, or inflammatory conditions.Marland Kitchen Psychiatric Judgement and insight Intact.. No evidence of depression, anxiety, or agitation.. General Notes: wound is completely healed and he has a minimal amount of redness due to the type of dressing he was using. Integumentary (Hair, Skin) No suspicious lesions. No crepitus or fluctuance. No peri-wound warmth or erythema. No masses.. Wound #1 status is Healed - Epithelialized. Original cause of wound was Bite. The wound is located on the Right,Medial Knee. The wound measures 0cm length x 0cm width x 0cm depth; 0cm^2 area and 0cm^3 volume. There is no tunneling or undermining noted. There is a large amount of serosanguineous drainage noted. The wound margin is indistinct and nonvisible. There is no granulation within the wound bed. There is no necrotic tissue within the wound bed. The periwound skin appearance exhibited: Erythema. The periwound skin appearance did not exhibit: Callus, Crepitus, Excoriation, Fluctuance, Edward Mccoy, Induration, Edward Mccoy, Edward Mccoy. ([id number]) Localized Edema, Rash, Scarring, Dry/Scaly, Maceration, Moist, Atrophie Blanche, Cyanosis, Ecchymosis, Hemosiderin Staining, Mottled, Pallor, Rubor. The surrounding wound skin color is noted with erythema which is circumferential. Periwound temperature was noted as Hot. Assessment Active  Problems ICD-10 L03.115 - Cellulitis of right lower limb S80.261A - Insect bite (nonvenomous), right knee, initial encounter L97.112 - Non-pressure chronic ulcer of right thigh with fat layer exposed I have recommended he wash with soap and water pat dry and apply a small Band-Aid just protect the skin for now. He is completely healed and we have discharged him from the wound care services will see him back as needed. Plan Discharge From [facility]: Discharge from [place] - treatment complete I have recommended he wash with soap and water pat dry and apply  a small Band-Aid just protect the skin for now. He is completely healed and we have discharged him from the wound care services will see him back as needed. Electronic Signature(s) Signed: 06/05/2015 1:54:33 PM By: Christin Fudge MD, FACS Entered By: Christin Fudge on 06/05/2015 13:54:33 Karn, Edward Mccoy ([id number]) -------------------------------------------------------------------------------- SuperBill Details Patient Name: Edward Mccoy Date of Service: 06/05/2015 Medical Record Number: [id number] Patient Account Number: [id number] Date of Birth/Sex: June 23, 1951 (64 y.o. 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

## 2015-06-05 NOTE — Progress Notes (Addendum)
Edward Mccoy ([id number]) Visit Report for 06/05/2015 Arrival Information Details Patient Name: Edward Mccoy Date of Service: 06/05/2015 1:30 PM Medical Record Number: [id number] Patient Account Number: [id number] Date of Birth/Sex: 04-20-1951 (64 y.o. Male) Treating RN: Cornell Barman Primary Care Physician: Cranford Mon, Delfino Lovett Other Clinician: Referring Physician: Cranford Mon, Delfino Lovett Treating Physician/Extender: Frann Rider in Treatment: 2 Visit Information History Since Last Visit Added or deleted any medications: No Patient Arrived: Ambulatory Any new allergies or adverse reactions: No Arrival Time: 13:43 Had a fall or experienced change in No Accompanied By: self activities of daily living that may affect Transfer Assistance: None risk of falls: Patient Identification Verified: Yes Signs or symptoms of abuse/neglect since last No Secondary Verification Process Yes visito Completed: Hospitalized since last visit: No Patient Has Alerts: Yes Pain Present Now: No Patient Alerts: Patient on Blood Thinner 81mg  aspirin Electronic Signature(s) Signed: 06/05/2015 4:23:10 PM By: Gretta Cool, RN, BSN, Kim RN, BSN Entered By: Gretta Cool, RN, BSN, Kim on 06/05/2015 13:45:01 Edward Mccoy ([id number]) -------------------------------------------------------------------------------- Clinic Level of Care Assessment Details Patient Name: Edward Mccoy Date of Service: 06/05/2015 1:30 PM Medical Record Number: [id number] Patient Account Number: [id number] Date of Birth/Sex: 12/28/1950 (64 y.o. Male) Treating RN: Cornell Barman Primary Care Physician: Cranford Mon, Delfino Lovett Other Clinician: Referring Physician: Cranford Mon, RICHARD Treating Physician/Extender: Frann Rider in Treatment: 2 Clinic Level of Care Assessment Items TOOL 4 Quantity Score []  - Use when only an EandM is performed on FOLLOW-UP visit 0 ASSESSMENTS - Nursing Assessment / Reassessment []  - Reassessment of  Co-morbidities (includes updates in patient status) 0 X - Reassessment of Adherence to Treatment Plan 1 5 ASSESSMENTS - Wound and Skin Assessment / Reassessment X - Simple Wound Assessment / Reassessment - one wound 1 5 []  - Complex Wound Assessment / Reassessment - multiple wounds 0 []  - Dermatologic / Skin Assessment (not related to wound area) 0 ASSESSMENTS - Focused Assessment []  - Circumferential Edema Measurements - multi extremities 0 []  - Nutritional Assessment / Counseling / Intervention 0 []  - Lower Extremity Assessment (monofilament, tuning fork, pulses) 0 []  - Peripheral Arterial Disease Assessment (using hand held doppler) 0 ASSESSMENTS - Ostomy and/or Continence Assessment and Care []  - Incontinence Assessment and Management 0 []  - Ostomy Care Assessment and Management (repouching, etc.) 0 PROCESS - Coordination of Care X - Simple Patient / Family Education for ongoing care 1 15 []  - Complex (extensive) Patient / Family Education for ongoing care 0 X - Staff obtains Programmer, systems, Records, Test Results / Process Orders 1 10 []  - Staff telephones HHA, Nursing Homes / Clarify orders / etc 0 []  - Routine Transfer to another Facility (non-emergent condition) 0 Edward Mccoy ([id number]) []  - Routine Hospital Admission (non-emergent condition) 0 []  - New Admissions / Biomedical engineer / Ordering NPWT, Apligraf, etc. 0 []  - Emergency Hospital Admission (emergent condition) 0 X - Simple Discharge Coordination 1 10 []  - Complex (extensive) Discharge Coordination 0 PROCESS - Special Needs []  - Pediatric / Minor Patient Management 0 []  - Isolation Patient Management 0 []  - Hearing / Language / Visual special needs 0 []  - Assessment of Community assistance (transportation, D/C planning, etc.) 0 []  - Additional assistance / Altered mentation 0 []  - Support Surface(s) Assessment (bed, cushion, seat, etc.) 0 INTERVENTIONS - Wound Cleansing / Measurement X - Simple Wound Cleansing  - one wound 1 5 []  - Complex Wound Cleansing - multiple wounds 0 X - Wound Imaging (photographs - any number of  wounds) 1 5 []  - Wound Tracing (instead of photographs) 0 X - Simple Wound Measurement - one wound 1 5 []  - Complex Wound Measurement - multiple wounds 0 INTERVENTIONS - Wound Dressings X - Small Wound Dressing one or multiple wounds 1 10 []  - Medium Wound Dressing one or multiple wounds 0 []  - Large Wound Dressing one or multiple wounds 0 []  - Application of Medications - topical 0 []  - Application of Medications - injection 0 INTERVENTIONS - Miscellaneous []  - External ear exam 0 [name], MOHSIN Mccoy ([id number]) []  - Specimen Collection (cultures, biopsies, blood, body fluids, etc.) 0 []  - Specimen(s) / Culture(s) sent or taken to Lab for analysis 0 []  - Patient Transfer (multiple staff / Harrel Lemon Lift / Similar devices) 0 []  - Simple Staple / Suture removal (25 or less) 0 []  - Complex Staple / Suture removal (26 or more) 0 []  - Hypo / Hyperglycemic Management (close monitor of Blood Glucose) 0 []  - Ankle / Brachial Index (ABI) - do not check if billed separately 0 X - Vital Signs 1 5 Has the patient been seen at the hospital within the last three years: Yes Total Score: 75 Level Of Care: New/Established - Level 2 Electronic Signature(s) Signed: 06/05/2015 4:23:10 PM By: Gretta Cool, RN, BSN, Kim RN, BSN Entered By: Gretta Cool, RN, BSN, Kim on 06/05/2015 13:54:20 Edward Mccoy ([id number]) -------------------------------------------------------------------------------- Encounter Discharge Information Details Patient Name: Edward Mccoy Date of Service: 06/05/2015 1:30 PM Medical Record Number: [id number] Patient Account Number: [id number] Date of Birth/Sex: [birth year] (64 y.o. Male) Treating RN: Cornell Barman Primary Care Physician: Cranford Mon, Delfino Lovett Other Clinician: Referring Physician: Cranford Mon, RICHARD Treating Physician/Extender: Frann Rider in Treatment: 2 Encounter  Discharge Information Items Discharge Pain Level: 0 Discharge Condition: Stable Ambulatory Status: Ambulatory Discharge Destination: Home Transportation: Private Auto Accompanied By: self Schedule Follow-up Appointment: Yes Medication Reconciliation completed and provided to Patient/Care Yes Dilan Fullenwider: Provided on Clinical Summary of Care: 06/05/2015 Form Type Recipient Paper Patient JP Electronic Signature(s) Signed: 06/06/2015 11:40:26 AM By: Ruthine Dose Previous Signature: 06/05/2015 1:59:23 PM Version By: Ruthine Dose Entered By: Ruthine Dose on 06/06/2015 11:40:25 Mccort, Edward Mccoy ([id number]) -------------------------------------------------------------------------------- Lower Extremity Assessment Details Patient Name: Edward Mccoy Date of Service: 06/05/2015 1:30 PM Medical Record Number: [id number] Patient Account Number: [id number] Date of Birth/Sex: Nov 18, 1950 (64 y.o. Male) Treating RN: Cornell Barman Primary Care Physician: Cranford Mon, Delfino Lovett Other Clinician: Referring Physician: Cranford Mon, RICHARD Treating Physician/Extender: Frann Rider in Treatment: 2 Edema Assessment Assessed: [Left: No] [Right: No] Edema: [Left: N] [Right: o] Vascular Assessment Pulses: Posterior Tibial Dorsalis Pedis Palpable: [Left:Yes] Extremity colors, hair growth, and conditions: Extremity Color: [Left:Normal] Hair Growth on Extremity: [Left:Yes] Temperature of Extremity: [Left:Warm] Capillary Refill: [Left:< 3 seconds] Toe Nail Assessment Left: Right: Thick: No Discolored: No Deformed: No Improper Length and Hygiene: No Electronic Signature(s) Signed: 06/05/2015 4:23:10 PM By: Gretta Cool, RN, BSN, Kim RN, BSN Entered By: Gretta Cool, RN, BSN, Kim on 06/05/2015 13:47:04 Lovett, Edward Mccoy ([id number]) -------------------------------------------------------------------------------- Multi Wound Chart Details Patient Name: Edward Mccoy Date of Service: 06/05/2015 1:30 PM Medical  Record Number: [id number] Patient Account Number: [id number] Date of Birth/Sex: 1951-07-17 (64 y.o. Male) Treating RN: Cornell Barman Primary Care Physician: Cranford Mon, Delfino Lovett Other Clinician: Referring Physician: Cranford Mon, RICHARD Treating Physician/Extender: Frann Rider in Treatment: 2 Vital Signs Height(in): 70 Pulse(bpm): 56 Weight(lbs): 195 Blood Pressure 112/79 (mmHg): Body Mass Index(BMI): 28 Temperature(F): 98.2 Respiratory Rate 18 (breaths/min): Photos: [1:No Photos] [N/A:N/A] Wound Location: [1:Right Knee -  Medial] [N/A:N/A] Wounding Event: [1:Bite] [N/A:N/A] Primary Etiology: [1:Necrotizing Infection] [N/A:N/A] Secondary Etiology: [1:Infection - not elsewhere classified] [N/A:N/A] Date Acquired: [1:05/11/2015] [N/A:N/A] Weeks of Treatment: [1:2] [N/A:N/A] Wound Status: [1:Open] [N/A:N/A] Clustered Wound: [1:Yes] [N/A:N/A] Measurements L x W x D 0.1x0.2x0.1 [N/A:N/A] (cm) Area (cm) : [1:0.016] [N/A:N/A] Volume (cm) : [1:0.002] [N/A:N/A] % Reduction in Area: [1:99.80%] [N/A:N/A] % Reduction in Volume: 99.90% [N/A:N/A] Classification: [1:Full Thickness Without Exposed Support Structures] [N/A:N/A] Exudate Amount: [1:Large] [N/A:N/A] Exudate Type: [1:Serosanguineous] [N/A:N/A] Exudate Color: [1:red, brown] [N/A:N/A] Wound Margin: [1:Indistinct, nonvisible] [N/A:N/A] Granulation Amount: [1:Large (67-100%)] [N/A:N/A] Granulation Quality: [1:Red, Hyper-granulation] [N/A:N/A] Necrotic Amount: [1:None Present (0%)] [N/A:N/A] Epithelialization: [1:Large (67-100%)] [N/A:N/A] Periwound Skin Texture: Edema: No [1:Excoriation: No Induration: No] [N/A:N/A] Callus: No Crepitus: No Fluctuance: No Friable: No Rash: No Scarring: No Periwound Skin Moist: Yes N/A N/A Moisture: Maceration: No Dry/Scaly: No Periwound Skin Color: Erythema: Yes N/A N/A Atrophie Blanche: No Cyanosis: No Ecchymosis: No Hemosiderin Staining: No Mottled: No Pallor: No Rubor:  No Erythema Location: Circumferential N/A N/A Temperature: Hot N/A N/A Tenderness on No N/A N/A Palpation: Wound Preparation: Ulcer Cleansing: N/A N/A Rinsed/Irrigated with Saline Topical Anesthetic Applied: Other: lidocaine 4% Treatment Notes Electronic Signature(s) Signed: 06/05/2015 4:23:10 PM By: Gretta Cool, RN, BSN, Kim RN, BSN Entered By: Gretta Cool, RN, BSN, Kim on 06/05/2015 13:51:22 Giammarco, Edward Mccoy ([id number]) -------------------------------------------------------------------------------- [place] Details Patient Name: NAGEE, GOATES Date of Service: 06/05/2015 1:30 PM Medical Record Number: [id number] Patient Account Number: [id number] Date of Birth/Sex: 1950-11-01 (64 y.o. Male) Treating RN: Cornell Barman Primary Care Physician: Cranford Mon, Delfino Lovett Other Clinician: Referring Physician: Cranford Mon, Delfino Lovett Treating Physician/Extender: Frann Rider in Treatment: 2 Active Inactive Electronic Signature(s) Signed: 06/05/2015 4:23:10 PM By: Gretta Cool, RN, BSN, Kim RN, BSN Entered By: Gretta Cool, RN, BSN, Kim on 06/05/2015 13:53:20 Arp, Edward Mccoy ([id number]) -------------------------------------------------------------------------------- Patient/Caregiver Education Details Patient Name: Edward Mccoy Date of Service: 06/05/2015 1:30 PM Medical Record Number: [id number] Patient Account Number: [id number] Date of Birth/Gender: 11-Aug-1950 (64 y.o. Male) Treating RN: Cornell Barman Primary Care Physician: Cranford Mon, Delfino Lovett Other Clinician: Referring Physician: [name] Treating Physician/Extender: Frann Rider in Treatment: 2 Education Assessment Education Provided To: Patient Education Topics Provided Wound/Skin Impairment: Handouts: Caring for Your Ulcer Electronic Signature(s) Signed: 06/05/2015 4:23:10 PM By: Gretta Cool, RN, BSN, Kim RN, BSN Entered By: Gretta Cool, RN, BSN, Kim on 06/05/2015 13:55:06 Devino, Edward Mccoy  ([id number]) -------------------------------------------------------------------------------- Wound Assessment Details Patient Name: Edward Mccoy Date of Service: 06/05/2015 1:30 PM Medical Record Number: [id number] Patient Account Number: [id number] Date of Birth/Sex: 06-03-1951 (64 y.o. Male) Treating RN: Cornell Barman Primary Care Physician: Cranford Mon, Delfino Lovett Other Clinician: Referring Physician: Cranford Mon, RICHARD Treating Physician/Extender: Frann Rider in Treatment: 2 Wound Status Wound Number: 1 Primary Etiology: Necrotizing Infection Wound Location: Right Knee - Medial Secondary Infection - not elsewhere Etiology: classified Wounding Event: Bite Wound Status: Healed - Epithelialized Date Acquired: 05/11/2015 Weeks Of Treatment: 2 Clustered Wound: Yes Photos Photo Uploaded By: Gretta Cool, RN, BSN, Kim on 06/05/2015 17:06:18 Wound Measurements Length: (cm) 0 % Reduction i Width: (cm) 0 % Reduction i Depth: (cm) 0 Epithelializa Area: (cm) 0 Tunneling: Volume: (cm) 0 Undermining: n Area: 100% n Volume: 100% tion: Large (67-100%) No No Wound Description Full Thickness Without Exposed Classification: Support Structures Wound Margin: Indistinct, nonvisible Exudate Large Amount: Exudate Type: Serosanguineous Exudate Color: red, brown Wound Bed Granulation Amount: None Present (0%) Necrotic Amount: None Present (0%) Ellwood, ATTIKUS BARTOSZEK. ([id number]) Periwound Skin Texture Texture Color No Abnormalities Noted:  No No Abnormalities Noted: No Callus: No Atrophie Blanche: No Crepitus: No Cyanosis: No Excoriation: No Ecchymosis: No Fluctuance: No Erythema: Yes Friable: No Erythema Location: Circumferential Induration: No Hemosiderin Staining: No Localized Edema: No Mottled: No Rash: No Pallor: No Scarring: No Rubor: No Moisture Temperature / Pain No Abnormalities Noted: No Temperature: Hot Dry / Scaly: No Maceration: No Moist: No Wound  Preparation Ulcer Cleansing: Rinsed/Irrigated with Saline Topical Anesthetic Applied: None Electronic Signature(s) Signed: 06/05/2015 4:23:10 PM By: Gretta Cool, RN, BSN, Kim RN, BSN Entered By: Gretta Cool, RN, BSN, Kim on 06/05/2015 13:53:02 Vine, Edward Mccoy ([id number]) -------------------------------------------------------------------------------- [place] Details Patient Name: Edward Mccoy Date of Service: 06/05/2015 1:30 PM Medical Record Number: [id number] Patient Account Number: [id number] Date of Birth/Sex: [birth year] (64 y.o. Male) Treating RN: Cornell Barman Primary Care Physician: Cranford Mon, Delfino Lovett Other Clinician: Referring Physician: Cranford Mon, Delfino Lovett Treating Physician/Extender: Frann Rider in Treatment: 2 Vital Signs Time Taken: 13:45 Temperature (F): 98.2 Height (in): 70 Pulse (bpm): 56 Weight (lbs): 195 Respiratory Rate (breaths/min): 18 Body Mass Index (BMI): 28 Blood Pressure (mmHg): 112/79 Reference Range: 80 - 120 mg / dl Electronic Signature(s) Signed: 06/05/2015 4:23:10 PM By: Gretta Cool, RN, BSN, Kim RN, BSN Entered By: Gretta Cool, RN, BSN, Kim on 06/05/2015 13:45:58

## 2015-06-21 ENCOUNTER — Other Ambulatory Visit: Payer: Self-pay | Admitting: Family Medicine

## 2015-07-09 ENCOUNTER — Encounter: Payer: Self-pay | Admitting: Family Medicine

## 2015-07-09 ENCOUNTER — Other Ambulatory Visit: Payer: Self-pay | Admitting: Family Medicine

## 2015-07-09 ENCOUNTER — Ambulatory Visit (INDEPENDENT_AMBULATORY_CARE_PROVIDER_SITE_OTHER): Payer: 59 | Admitting: Family Medicine

## 2015-07-09 VITALS — BP 124/76 | HR 62 | Temp 98.5°F | Resp 16 | Wt 200.6 lb

## 2015-07-09 DIAGNOSIS — J069 Acute upper respiratory infection, unspecified: Secondary | ICD-10-CM | POA: Diagnosis not present

## 2015-07-09 MED ORDER — HYDROCODONE-HOMATROPINE 5-1.5 MG/5ML PO SYRP
ORAL_SOLUTION | ORAL | Status: DC
Start: 2015-07-09 — End: 2015-08-14

## 2015-07-09 NOTE — Progress Notes (Signed)
Subjective:     Patient ID: Edward Mccoy, male   DOB: 07-25-1951, 64 y.o.   MRN: [id number]  HPI  Chief Complaint  Patient presents with  . Cough    Patient comes in office today with concerns of cough and congestion since 12/2. Patient states that hymptoms gradually got worse on 12/3, patient reports productive cough of mucous and tightness in chest. Patient denies taking any otc medication.   Reports cold sx.   Review of Systems  Constitutional: Negative for fever and chills.       Objective:   Physical Exam  Constitutional: He appears well-developed. No distress.  Ears: T.M's intact without inflammation Throat: no tonsillar enlargement or exudate Neck: no cervical adenopathy Lungs: clear     Assessment:    1. Upper respiratory infectio - HYDROcodone-homatropine (HYCODAN) 5-1.5 MG/5ML syrup; 5 ml 4-6 hours as needed for cough  Dispense: 240 mL; Refill: 0    Plan:    Discussed use of Mucinex D for congestion, Delsym for cough, and Benadryl for postnasal drainage

## 2015-07-09 NOTE — Patient Instructions (Signed)
Discussed use of Mucinex D for congestion, Delsym for cough, and Benadryl for postnasal drainage 

## 2015-07-24 ENCOUNTER — Inpatient Hospital Stay: Payer: 59 | Attending: Oncology | Admitting: *Deleted

## 2015-07-24 DIAGNOSIS — Z8589 Personal history of malignant neoplasm of other organs and systems: Secondary | ICD-10-CM | POA: Insufficient documentation

## 2015-07-24 DIAGNOSIS — C181 Malignant neoplasm of appendix: Secondary | ICD-10-CM

## 2015-07-24 LAB — COMPREHENSIVE METABOLIC PANEL
ALBUMIN: 4.2 g/dL (ref 3.5–5.0)
ALT: 29 U/L (ref 17–63)
AST: 27 U/L (ref 15–41)
Alkaline Phosphatase: 38 U/L (ref 38–126)
Anion gap: 5 (ref 5–15)
BUN: 21 mg/dL — AB (ref 6–20)
CHLORIDE: 104 mmol/L (ref 101–111)
CO2: 27 mmol/L (ref 22–32)
CREATININE: 0.97 mg/dL (ref 0.61–1.24)
Calcium: 8.6 mg/dL — ABNORMAL LOW (ref 8.9–10.3)
GFR calc Af Amer: >60 mL/min (ref >60)
GLUCOSE: 108 mg/dL — AB (ref 65–99)
POTASSIUM: 4 mmol/L (ref 3.5–5.1)
Sodium: 136 mmol/L (ref 135–145)
Total Bilirubin: 0.6 mg/dL (ref 0.3–1.2)
Total Protein: 7.1 g/dL (ref 6.5–8.1)

## 2015-07-24 LAB — CBC WITH DIFFERENTIAL/PLATELET
BASOS ABS: 0 10*3/uL (ref 0–0.1)
BASOS PCT: 1 %
EOS PCT: 2 %
Eosinophils Absolute: 0.1 10*3/uL (ref 0–0.7)
HEMATOCRIT: 42.5 % (ref 40.0–52.0)
Hemoglobin: 14.3 g/dL (ref 13.0–18.0)
LYMPHS PCT: 27 %
Lymphs Abs: 1.5 10*3/uL (ref 1.0–3.6)
MCH: 30.7 pg (ref 26.0–34.0)
MCHC: 33.7 g/dL (ref 32.0–36.0)
MCV: 91.2 fL (ref 80.0–100.0)
Monocytes Absolute: 0.5 10*3/uL (ref 0.2–1.0)
Monocytes Relative: 8 %
NEUTROS ABS: 3.4 10*3/uL (ref 1.4–6.5)
Neutrophils Relative %: 62 %
PLATELETS: 214 10*3/uL (ref 150–440)
RBC: 4.66 MIL/uL (ref 4.40–5.90)
RDW: 13.2 % (ref 11.5–14.5)
WBC: 5.5 10*3/uL (ref 3.8–10.6)

## 2015-07-25 DIAGNOSIS — Z8589 Personal history of malignant neoplasm of other organs and systems: Secondary | ICD-10-CM | POA: Diagnosis not present

## 2015-07-26 DIAGNOSIS — E041 Nontoxic single thyroid nodule: Secondary | ICD-10-CM | POA: Insufficient documentation

## 2015-07-26 DIAGNOSIS — M1990 Unspecified osteoarthritis, unspecified site: Secondary | ICD-10-CM | POA: Insufficient documentation

## 2015-07-26 DIAGNOSIS — E785 Hyperlipidemia, unspecified: Secondary | ICD-10-CM | POA: Insufficient documentation

## 2015-07-26 LAB — CEA: CEA: 1 ng/mL (ref 0.0–4.7)

## 2015-07-31 ENCOUNTER — Ambulatory Visit: Payer: 59 | Admitting: Oncology

## 2015-08-09 ENCOUNTER — Inpatient Hospital Stay: Payer: 59 | Attending: Oncology | Admitting: Oncology

## 2015-08-09 VITALS — BP 122/78 | HR 57 | Temp 97.3°F | Resp 18 | Wt 196.0 lb

## 2015-08-09 DIAGNOSIS — Z7982 Long term (current) use of aspirin: Secondary | ICD-10-CM | POA: Diagnosis not present

## 2015-08-09 DIAGNOSIS — Z8589 Personal history of malignant neoplasm of other organs and systems: Secondary | ICD-10-CM | POA: Insufficient documentation

## 2015-08-09 DIAGNOSIS — Z8546 Personal history of malignant neoplasm of prostate: Secondary | ICD-10-CM

## 2015-08-09 DIAGNOSIS — C181 Malignant neoplasm of appendix: Secondary | ICD-10-CM

## 2015-08-09 DIAGNOSIS — Z79899 Other long term (current) drug therapy: Secondary | ICD-10-CM | POA: Diagnosis not present

## 2015-08-09 DIAGNOSIS — Z86010 Personal history of colonic polyps: Secondary | ICD-10-CM | POA: Diagnosis not present

## 2015-08-14 ENCOUNTER — Ambulatory Visit (INDEPENDENT_AMBULATORY_CARE_PROVIDER_SITE_OTHER): Payer: 59 | Admitting: Family Medicine

## 2015-08-14 ENCOUNTER — Encounter: Payer: Self-pay | Admitting: Family Medicine

## 2015-08-14 VITALS — BP 106/64 | HR 58 | Temp 98.1°F | Resp 16 | Ht 70.0 in | Wt 196.0 lb

## 2015-08-14 DIAGNOSIS — Z23 Encounter for immunization: Secondary | ICD-10-CM

## 2015-08-14 DIAGNOSIS — Z0000 Encounter for general adult medical examination without abnormal findings: Secondary | ICD-10-CM

## 2015-08-14 DIAGNOSIS — Z8546 Personal history of malignant neoplasm of prostate: Secondary | ICD-10-CM | POA: Diagnosis not present

## 2015-08-14 LAB — POCT URINALYSIS DIPSTICK
Bilirubin, UA: NEGATIVE
GLUCOSE UA: NEGATIVE
Ketones, UA: NEGATIVE
Leukocytes, UA: NEGATIVE
NITRITE UA: NEGATIVE
PROTEIN UA: NEGATIVE
RBC UA: NEGATIVE
SPEC GRAV UA: 1.025
UROBILINOGEN UA: 0.2
pH, UA: 5

## 2015-08-14 NOTE — Progress Notes (Signed)
Patient ID: Edward Mccoy, male   DOB: 1950-12-11, 65 y.o.   MRN: [id number]       Patient: Edward Mccoy, Male    DOB: 1950-11-12, 65 y.o.   MRN: [id number] Visit Date: 08/14/2015  Today's Provider: [name], MD   Chief Complaint  Patient presents with  . Annual Exam   Subjective:    Annual physical exam Edward Mccoy is a 65 y.o. male who presents today for health maintenance and complete physical. He feels well. He reports he is exercising, running or cycling most days a week. He reports he is sleeping well.  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   HENT: Positive for congestion and sore throat.   Eyes: Negative.   Respiratory: Negative.   Cardiovascular: Negative.   Gastrointestinal: Negative.   Endocrine: Negative.   Genitourinary: Negative.   Musculoskeletal: Negative.   Skin: Negative.   Allergic/Immunologic: Negative.   Neurological: Negative.   Hematological: Negative.   Psychiatric/Behavioral: Negative.     Social History      He  reports that he has never smoked. He has never used smokeless tobacco. He reports that he drinks alcohol. He reports that he does not use illicit drugs.       Social History   Social History  . Marital Status: Married    Spouse Name: N/A  . Number of Children: N/A  . Years of Education: N/A   Social History Main Topics  . Smoking status: Never Smoker   . Smokeless tobacco: Never Used  . Alcohol Use: 0.0 oz/week    0 Standard drinks or equivalent per week     Comment: occasionally  . Drug Use: No  . Sexual Activity: Not Asked   Other Topics Concern  . None   Social History Narrative    Past Medical History  Diagnosis Date  . Colon polyp   . Cancer [facility] 2008    prostate  . 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 HLD (hyperlipidemia) 07/26/2015  . Arthritis, degenerative 07/26/2015  . Thyroid nodule 07/26/2015  . 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 ED (erectile dysfunction) of organic origin 01/31/2013  . H/O malignant neoplasm of prostate 01/31/2013    Past Surgical History  Procedure Laterality Date  . Prostate surgery  June 2008  . Shoulder surgery  Nov 1999, Oct 2015  . Colonoscopy  07-09-06    Dr Allen Norris  . Appendectomy  11/15/14    patient report it was cancerous     Family History        Family Status  Relation Status Death Age  . Mother Alive   . Father Deceased         His family history includes Cancer in his mother; Diabetes in his father; Parkinson's disease in his father.    No Known Allergies  Previous Medications   ALPROSTADIL (EDEX) 20 MCG INJECTION    USE AS DIRECTED NO MORE THAN TIMES PER WEEK   ASCORBIC ACID (VITAMIN C) 1000 MG TABLET    Take 1,000 mg by mouth daily.   ASPIRIN EC 81 MG TABLET    Take 81 mg by mouth daily.   CALCIUM POLYCARBOPHIL PO  Take by mouth.   CELECOXIB (CELEBREX) 200 MG CAPSULE    Take by mouth.   MULTIPLE VITAMINS-MINERALS (MULTIVITAMIN ADULT PO)    Take by mouth daily.   OMEGA-3 FATTY ACIDS (FISH OIL) 1200 MG CAPS    Take by mouth.   SIMVASTATIN (ZOCOR) 10 MG TABLET    TAKE ONE TABLET AT BEDTIME   TADALAFIL (CIALIS) 20 MG TABLET    Take by mouth.   VITAMIN E 400 UNIT CAPSULE    Take 400 Units by mouth daily.    Patient Care Team: [name]., MD as PCP - General (Family Medicine) [name]., MD (Family Medicine) Seeplaputhur Robinette Haines, MD (General Surgery)     Objective:   Vitals: BP 106/64 mmHg  Pulse 58  Temp(Src) 98.1 F (36.7 C) (Oral)  Resp 16  Ht 5\' 10"  (1.778 m)  Wt 196 lb (88.905 kg)  BMI 28.12 kg/m2   Physical Exam  Constitutional: He is oriented to  person, place, and time. He appears well-developed and well-nourished.  HENT:  Head: Normocephalic and atraumatic.  Right Ear: External ear normal.  Left Ear: External ear normal.  Nose: Nose normal.  Mouth/Throat: Oropharynx is clear and moist.  Eyes: Conjunctivae and EOM are normal. Pupils are equal, round, and reactive to light.  Neck: Normal range of motion. Neck supple.  Cardiovascular: Normal rate, regular rhythm, normal heart sounds and intact distal pulses.   Pulmonary/Chest: Effort normal and breath sounds normal.  Abdominal: Soft. Bowel sounds are normal.  Musculoskeletal: Normal range of motion.  Neurological: He is alert and oriented to person, place, and time. He has normal reflexes.  Skin: Skin is warm and dry.  Psychiatric: He has a normal mood and affect. His behavior is normal. Judgment and thought content normal.     Depression Screen PHQ 2/9 Scores 08/14/2015  PHQ - 2 Score 0      Assessment & Plan:     Routine Health Maintenance and Physical Exam  Exercise Activities and Dietary recommendations Goals    None      Immunization History  Administered Date(s) Administered  . Td 01/03/2005  . Tdap 07/24/2011  . Zoster 08/09/2013    Health Maintenance  Topic Date Due  . Hepatitis C Screening  [birth year]  . HIV Screening  05/24/1965  . PNA vac Low Risk Adult (1 of 2 - PCV13) 05/25/2015  . INFLUENZA VACCINE  11/02/2015 (Originally 03/05/2015)  . COLONOSCOPY  07/09/2016  . TETANUS/TDAP  07/23/2021  . ZOSTAVAX  Completed      Discussed health benefits of physical activity, and encouraged him to engage in regular exercise appropriate for his age and condition.   Prostate cancer Cancer of Appendix Recent MRSA skin infections (2) Per dermatology URI Resolving. I have done the exam and reviewed the above chart and it is accurate to the best of my knowledge.  --------------------------------------------------------------------

## 2015-08-14 NOTE — Progress Notes (Signed)
[place] @ [facility] [phone]  [phone]   DOS: 08/08/14  Edward Mccoy DOB: 1951-04-15  MR#: [id number]  [id number]  Patient Care Team: [name]., MD as PCP - General (Family Medicine) [name]., MD (Family Medicine) Seeplaputhur Robinette Haines, MD (General Surgery)  CHIEF COMPLAINT:  Chief Complaint  Patient presents with  . Malignant neoplasm of appendix    Oncology History   Carcinoma of appendix. Mucinouscarcinoma no evidence of invasion or extra appendiceal tumor present status post appendectomyin April of 2016     Malignant neoplasm of appendix ([place])   11/20/2014 Initial Diagnosis Malignant neoplasm of appendix    No flowsheet data found.  INTERVAL HISTORY: Patient is here for further follow up regarding history of carcinoma of the appendix. Patient is status post appendectomy in April 2016, there was no evidence of invasion or extra appendiceal tumor present. The patient reports overall feeling very well with no acute complaints. He denies any abdominal pain, nausea, vomiting, or diarrhea. His most recent CEA was December 21 and was reported as 1.0. Otherwise patient reports doing very well with no new hospitalizations or issues.  REVIEW OF SYSTEMS:   GENERAL:  Feels good.  Active.  No fevers, sweats or weight loss. PERFORMANCE STATUS (ECOG):  0 HEENT:  No visual changes, runny nose, sore throat, mouth sores or tenderness. Lungs: No shortness of breath or cough.  No hemoptysis. Cardiac:  No chest pain, palpitations, orthopnea, or PND. GI:  No nausea, vomiting, diarrhea, constipation, melena or hematochezia. GU:  No urgency, frequency, dysuria, or hematuria. Musculoskeletal:  No back pain.  No joint pain.  No muscle tenderness. Extremities:  No pain or swelling. Skin:  No rashes or skin changes. Neuro:  No headache, numbness or weakness, balance or coordination issues. Endocrine:  No diabetes, thyroid issues, hot flashes or  night sweats. Psych:  No mood changes, depression or anxiety. Pain:  No focal pain. Review of systems:  All other systems reviewed and found to be negative. As per HPI. Otherwise, a complete review of systems is negatve.  PAST MEDICAL HISTORY: Past Medical History  Diagnosis Date  . Colon polyp   . Cancer [facility]) 2008    prostate  . Cancer of appendix ([place]) 11/15/14    high-grade appendiceal mucinous neoplasm    PAST SURGICAL HISTORY: Past Surgical History  Procedure Laterality Date  . Prostate surgery  June 2008  . Shoulder surgery  Nov 1999, Oct 2015  . Colonoscopy  07-09-06    Dr Allen Norris  . Appendectomy  11/15/14    patient report it was cancerous     FAMILY HISTORY Family History  Problem Relation Age of Onset  . Cancer Mother   . Diabetes Father   . Parkinson's disease Father     ADVANCED DIRECTIVES:  No flowsheet data found.  HEALTH MAINTENANCE: Social History  Substance Use Topics  . Smoking status: Never Smoker   . Smokeless tobacco: Never Used  . Alcohol Use: 0.0 oz/week    0 Standard drinks or equivalent per week     Comment: occasionally      No Known Allergies  Current Outpatient Prescriptions  Medication Sig Dispense Refill  . alprostadil (EDEX) 20 MCG injection USE AS DIRECTED NO MORE THAN TIMES PER WEEK    . Ascorbic Acid (VITAMIN C) 1000 MG tablet Take 1,000 mg by mouth daily.    Marland Kitchen aspirin EC 81 MG tablet Take 81 mg by mouth daily.    Marland Kitchen  CALCIUM POLYCARBOPHIL PO Take by mouth.    . celecoxib (CELEBREX) 200 MG capsule Take by mouth.    . Multiple Vitamins-Minerals (MULTIVITAMIN ADULT PO) Take by mouth daily.    . Omega-3 Fatty Acids (FISH OIL) 1200 MG CAPS Take by mouth.    . simvastatin (ZOCOR) 10 MG tablet TAKE ONE TABLET AT BEDTIME 90 tablet 1  . tadalafil (CIALIS) 20 MG tablet Take by mouth.    . vitamin E 400 UNIT capsule Take 400 Units by mouth daily.     No current facility-administered medications for this visit.    OBJECTIVE:  Filed  Vitals:   08/09/15 1216  BP: 122/78  Pulse: 57  Temp: 97.3 F (36.3 C)  Resp: 18     Body mass index is 28.93 kg/(m^2).    ECOG FS:0 - Asymptomatic  PHYSICAL EXAM: GENERAL:  Well developed, well nourished, sitting comfortably in the exam room in no acute distress. MENTAL STATUS:  Alert and oriented to person, place and time. HEAD:  Normocephalic, atraumatic, face symmetric, no Cushingoid features. EYES:  .  Pupils equal round and reactive to light and accomodation.  No conjunctivitis or scleral icterus. ENT:  Oropharynx clear without lesion.  Tongue normal. Mucous membranes moist.  RESPIRATORY:  Clear to auscultation without rales, wheezes or rhonchi. CARDIOVASCULAR:  Regular rate and rhythm without murmur, rub or gallop. ABDOMEN:  Soft, non-tender, with active bowel sounds, and no hepatosplenomegaly.  No masses. BACK:  No CVA tenderness.  No tenderness on percussion of the back or rib cage. SKIN:  No rashes, ulcers or lesions. EXTREMITIES: No edema, no skin discoloration or tenderness.  No palpable cords. LYMPH NODES: No palpable cervical, supraclavicular, axillary or inguinal adenopathy  NEUROLOGICAL: Unremarkable. PSYCH:  Appropriate.  LAB RESULTS:  No visits with results within 2 Day(s) from this visit. Latest known visit with results is:  Abstract on 07/26/2015  Component Date Value Ref Range Status  . HM Colonoscopy 07/09/2006 internal hemorrhoids, hyperplastic polyps   Final  . Neutrophils Absolute 08/10/2014 4   Final  . WBC 08/10/2014 6.4   Final  . Glucose 08/10/2014 102   Final  . BUN 08/10/2014 20  4 - 21 mg/dL Final  . Creatinine 08/10/2014 1.0  0.6 - 1.3 mg/dL Final  . Sodium 08/10/2014 141  137 - 147 mmol/L Final  . LDl/HDL Ratio 08/10/2014 2.6   Final  . Triglycerides 08/10/2014 139  40 - 160 mg/dL Final  . Cholesterol 08/10/2014 210* 0 - 200 mg/dL Final  . HDL 08/10/2014 51  35 - 70 mg/dL Final  . LDL Cholesterol 08/10/2014 131   Final  . TSH 08/10/2014  1.87  0.41 - 5.90 uIU/mL Final      ASSESSMENT: Carcinoma of appendix.  MEDICAL DECISION MAKING:  1. Carcinoma of appendix. Clinically there is no evidence of recurrent disease. CEA last collected in December 2016 and reported as 1.0.  patient sees Dr. Jamal Collin in follow-up in April 2017.  We will continue with routine follow-up in approximately 6 months.  Patient expressed understanding and was in agreement with this plan. He also understands that He can call clinic at any time with any questions, concerns, or complaints.    Evlyn Kanner, NP  08/09/2015 11:00 AM

## 2015-08-21 LAB — CBC WITH DIFFERENTIAL/PLATELET
BASOS ABS: 0 10*3/uL (ref 0.0–0.2)
BASOS: 0 %
EOS (ABSOLUTE): 0.1 10*3/uL (ref 0.0–0.4)
Eos: 1 %
HEMATOCRIT: 42 % (ref 37.5–51.0)
Hemoglobin: 14.1 g/dL (ref 12.6–17.7)
Immature Grans (Abs): 0 10*3/uL (ref 0.0–0.1)
Immature Granulocytes: 0 %
LYMPHS ABS: 1.7 10*3/uL (ref 0.7–3.1)
LYMPHS: 24 %
MCH: 31.4 pg (ref 26.6–33.0)
MCHC: 33.6 g/dL (ref 31.5–35.7)
MCV: 94 fL (ref 79–97)
MONOS ABS: 0.5 10*3/uL (ref 0.1–0.9)
Monocytes: 7 %
NEUTROS ABS: 4.7 10*3/uL (ref 1.4–7.0)
Neutrophils: 68 %
PLATELETS: 240 10*3/uL (ref 150–379)
RBC: 4.49 x10E6/uL (ref 4.14–5.80)
RDW: 13 % (ref 12.3–15.4)
WBC: 7 10*3/uL (ref 3.4–10.8)

## 2015-08-21 LAB — TSH: TSH: 1.12 u[IU]/mL (ref 0.450–4.500)

## 2015-08-21 LAB — LIPID PANEL WITH LDL/HDL RATIO
Cholesterol, Total: 198 mg/dL (ref 100–199)
HDL: 61 mg/dL (ref >39)
LDL Calculated: 122 mg/dL — ABNORMAL HIGH (ref 0–99)
LDL/HDL RATIO: 2 ratio (ref 0.0–3.6)
Triglycerides: 76 mg/dL (ref 0–149)
VLDL Cholesterol Cal: 15 mg/dL (ref 5–40)

## 2015-08-21 LAB — COMPREHENSIVE METABOLIC PANEL
ALBUMIN: 4.5 g/dL (ref 3.6–4.8)
ALT: 27 IU/L (ref 0–44)
AST: 25 IU/L (ref 0–40)
Albumin/Globulin Ratio: 2 (ref 1.1–2.5)
Alkaline Phosphatase: 37 IU/L — ABNORMAL LOW (ref 39–117)
BILIRUBIN TOTAL: 0.4 mg/dL (ref 0.0–1.2)
BUN/Creatinine Ratio: 22 (ref 10–22)
BUN: 21 mg/dL (ref 8–27)
CALCIUM: 9 mg/dL (ref 8.6–10.2)
CHLORIDE: 99 mmol/L (ref 96–106)
CO2: 25 mmol/L (ref 18–29)
CREATININE: 0.96 mg/dL (ref 0.76–1.27)
GFR calc non Af Amer: 83 mL/min/{1.73_m2} (ref >59)
GFR, EST AFRICAN AMERICAN: 95 mL/min/{1.73_m2} (ref >59)
GLUCOSE: 103 mg/dL — AB (ref 65–99)
Globulin, Total: 2.2 g/dL (ref 1.5–4.5)
Potassium: 4.1 mmol/L (ref 3.5–5.2)
Sodium: 143 mmol/L (ref 134–144)
TOTAL PROTEIN: 6.7 g/dL (ref 6.0–8.5)

## 2015-08-21 LAB — PSA: Prostate Specific Ag, Serum: <0.1 ng/mL (ref 0.0–4.0)

## 2015-09-12 ENCOUNTER — Encounter: Payer: Self-pay | Admitting: *Deleted

## 2015-09-12 ENCOUNTER — Other Ambulatory Visit: Payer: Self-pay | Admitting: *Deleted

## 2015-09-12 DIAGNOSIS — C181 Malignant neoplasm of appendix: Secondary | ICD-10-CM

## 2015-10-11 DIAGNOSIS — L02411 Cutaneous abscess of right axilla: Secondary | ICD-10-CM | POA: Diagnosis not present

## 2015-10-22 ENCOUNTER — Ambulatory Visit: Payer: 59

## 2015-10-29 ENCOUNTER — Ambulatory Visit
Admission: RE | Admit: 2015-10-29 | Discharge: 2015-10-29 | Disposition: A | Discharge status: Home / Self Care | Payer: PPO | Source: Ambulatory Visit | Attending: General Surgery | Admitting: General Surgery

## 2015-10-29 DIAGNOSIS — C61 Malignant neoplasm of prostate: Secondary | ICD-10-CM | POA: Diagnosis not present

## 2015-10-29 DIAGNOSIS — C181 Malignant neoplasm of appendix: Secondary | ICD-10-CM | POA: Diagnosis not present

## 2015-10-29 LAB — POCT I-STAT CREATININE: Creatinine, Ser: 1 mg/dL (ref 0.61–1.24)

## 2015-10-29 MED ORDER — IOPAMIDOL (ISOVUE-300) INJECTION 61%
100.0000 mL | Freq: Once | INTRAVENOUS | Status: AC | PRN
  Administered 2015-10-29: 100 mL via INTRAVENOUS

## 2015-11-05 ENCOUNTER — Ambulatory Visit (INDEPENDENT_AMBULATORY_CARE_PROVIDER_SITE_OTHER): Payer: PPO | Admitting: General Surgery

## 2015-11-05 ENCOUNTER — Encounter: Payer: Self-pay | Admitting: General Surgery

## 2015-11-05 VITALS — BP 110/68 | HR 68 | Resp 12 | Ht 70.0 in | Wt 198.0 lb

## 2015-11-05 DIAGNOSIS — C181 Malignant neoplasm of appendix: Secondary | ICD-10-CM

## 2015-11-05 NOTE — Progress Notes (Signed)
Patient ID: Edward Mccoy, male   DOB: April 07, 1951, 65 y.o.   MRN: [id number]  Chief Complaint  Patient presents with  . Follow-up    History of mucinous neoplasm of appendix    HPI Edward Mccoy is a 65 y.o. male here today for a one year follow up for  mucinous neoplasm of appendix. Patient had a CT done on 10/29/15. Patient doing well, no complaints. He stated he did have a MRSA infection on his right knee October 2016 that healed completely with wound care and antibitoics.  I have reviewed the history of present illness with the patient. HPI  Past Medical History  Diagnosis Date  . Colon polyp   . Cancer [facility]) 2008    prostate  . Cancer of appendix ([place]) 11/15/14    high-grade appendiceal mucinous neoplasm  . MRSA infection 05/2015    Right Kneee    Past Surgical History  Procedure Laterality Date  . Prostate surgery  June 2008  . Shoulder surgery  Nov 1999, Oct 2015  . Colonoscopy  07-09-06    Dr Allen Norris  . Appendectomy  11/15/14    patient report it was cancerous     Family History  Problem Relation Age of Onset  . Cancer Mother   . Diabetes Father   . Parkinson's disease Father     Social History Social History  Substance Use Topics  . Smoking status: Never Smoker   . Smokeless tobacco: Never Used  . Alcohol Use: 0.0 oz/week    0 Standard drinks or equivalent per week     Comment: occasionally    No Known Allergies  Current Outpatient Prescriptions  Medication Sig Dispense Refill  . alprostadil (EDEX) 20 MCG injection USE AS DIRECTED NO MORE THAN TIMES PER WEEK    . Ascorbic Acid (VITAMIN C) 1000 MG tablet Take 1,000 mg by mouth daily.    Marland Kitchen aspirin EC 81 MG tablet Take 81 mg by mouth daily.    Marland Kitchen CALCIUM POLYCARBOPHIL PO Take by mouth.    . celecoxib (CELEBREX) 200 MG capsule Take by mouth.    . Multiple Vitamins-Minerals (MULTIVITAMIN ADULT PO) Take by mouth daily.    . Omega-3 Fatty Acids (FISH OIL) 1200 MG CAPS Take by mouth.    . simvastatin (ZOCOR) 10 MG  tablet TAKE ONE TABLET AT BEDTIME 90 tablet 1  . tadalafil (CIALIS) 20 MG tablet Take by mouth.    . vitamin E 400 UNIT capsule Take 400 Units by mouth daily.     No current facility-administered medications for this visit.    Review of Systems Review of Systems  Constitutional: Negative.   Respiratory: Negative.   Cardiovascular: Negative.     Blood pressure 110/68, pulse 68, resp. rate 12, height 5\' 10"  (1.778 m), weight 198 lb (89.812 kg).  Physical Exam Physical Exam  Constitutional: He appears well-developed and well-nourished.  Eyes: Conjunctivae are normal. No scleral icterus.  Neck: Neck supple. No thyromegaly present.  Cardiovascular: Normal rate, regular rhythm and normal heart sounds.   Pulmonary/Chest: Effort normal and breath sounds normal.  Abdominal: Soft. Bowel sounds are normal. He exhibits no mass. There is no hepatomegaly. There is no tenderness. No hernia.  Lymphadenopathy:    He has no cervical adenopathy.    He has no axillary adenopathy.       Right: No inguinal adenopathy present.       Left: No inguinal adenopathy present.  Neurological: He is alert.  Skin: Skin  is warm and dry.    Data Reviewed CT scan and prior notes reviewed. CT showed no signs of recurrent disease Assessment    History of mucinous neoplasm of appendix. Stable exam. Last CEA was normal.    Plan    Follow up in one year with possible  CT scan-will discuss with Dr. Oliva Bustard    PCP: Dr. Rosanna Randy   This information has been scribed by Verlene Mayer, CMA    [facility] G 11/05/2015, 11:56 AM

## 2015-11-14 ENCOUNTER — Ambulatory Visit: Payer: 59

## 2015-11-20 ENCOUNTER — Ambulatory Visit: Payer: 59 | Admitting: General Surgery

## 2015-11-21 ENCOUNTER — Other Ambulatory Visit: Payer: Self-pay | Admitting: Family Medicine

## 2015-12-13 DIAGNOSIS — L570 Actinic keratosis: Secondary | ICD-10-CM | POA: Diagnosis not present

## 2015-12-13 DIAGNOSIS — L821 Other seborrheic keratosis: Secondary | ICD-10-CM | POA: Diagnosis not present

## 2015-12-13 DIAGNOSIS — Z85828 Personal history of other malignant neoplasm of skin: Secondary | ICD-10-CM | POA: Diagnosis not present

## 2015-12-13 DIAGNOSIS — Z08 Encounter for follow-up examination after completed treatment for malignant neoplasm: Secondary | ICD-10-CM | POA: Diagnosis not present

## 2015-12-13 DIAGNOSIS — X32XXXA Exposure to sunlight, initial encounter: Secondary | ICD-10-CM | POA: Diagnosis not present

## 2015-12-24 ENCOUNTER — Other Ambulatory Visit: Payer: Self-pay | Admitting: Family Medicine

## 2015-12-24 DIAGNOSIS — E7800 Pure hypercholesterolemia, unspecified: Secondary | ICD-10-CM

## 2016-01-31 ENCOUNTER — Other Ambulatory Visit: Payer: 59

## 2016-02-07 ENCOUNTER — Inpatient Hospital Stay: Payer: PPO | Attending: Oncology

## 2016-02-07 ENCOUNTER — Ambulatory Visit: Payer: 59 | Admitting: Oncology

## 2016-02-07 ENCOUNTER — Other Ambulatory Visit: Payer: Self-pay

## 2016-02-07 ENCOUNTER — Other Ambulatory Visit: Payer: 59

## 2016-02-07 DIAGNOSIS — Z79899 Other long term (current) drug therapy: Secondary | ICD-10-CM | POA: Insufficient documentation

## 2016-02-07 DIAGNOSIS — Z809 Family history of malignant neoplasm, unspecified: Secondary | ICD-10-CM | POA: Insufficient documentation

## 2016-02-07 DIAGNOSIS — C181 Malignant neoplasm of appendix: Secondary | ICD-10-CM | POA: Diagnosis not present

## 2016-02-07 DIAGNOSIS — Z7982 Long term (current) use of aspirin: Secondary | ICD-10-CM | POA: Diagnosis not present

## 2016-02-07 DIAGNOSIS — Z8546 Personal history of malignant neoplasm of prostate: Secondary | ICD-10-CM | POA: Insufficient documentation

## 2016-02-07 DIAGNOSIS — Z86010 Personal history of colonic polyps: Secondary | ICD-10-CM | POA: Diagnosis not present

## 2016-02-07 DIAGNOSIS — Z8614 Personal history of Methicillin resistant Staphylococcus aureus infection: Secondary | ICD-10-CM | POA: Insufficient documentation

## 2016-02-07 LAB — CBC WITH DIFFERENTIAL/PLATELET
BASOS ABS: 0 10*3/uL (ref 0–0.1)
Basophils Relative: 1 %
EOS ABS: 0.5 10*3/uL (ref 0–0.7)
EOS PCT: 7 %
HCT: 41 % (ref 40.0–52.0)
Hemoglobin: 14.3 g/dL (ref 13.0–18.0)
LYMPHS ABS: 1.9 10*3/uL (ref 1.0–3.6)
LYMPHS PCT: 25 %
MCH: 32.1 pg (ref 26.0–34.0)
MCHC: 34.8 g/dL (ref 32.0–36.0)
MCV: 92.2 fL (ref 80.0–100.0)
MONO ABS: 0.8 10*3/uL (ref 0.2–1.0)
Monocytes Relative: 10 %
Neutro Abs: 4.3 10*3/uL (ref 1.4–6.5)
Neutrophils Relative %: 57 %
Platelets: 194 10*3/uL (ref 150–440)
RBC: 4.45 MIL/uL (ref 4.40–5.90)
RDW: 13 % (ref 11.5–14.5)
WBC: 7.6 10*3/uL (ref 3.8–10.6)

## 2016-02-07 LAB — COMPREHENSIVE METABOLIC PANEL
ALT: 30 U/L (ref 17–63)
AST: 31 U/L (ref 15–41)
Albumin: 4.2 g/dL (ref 3.5–5.0)
Alkaline Phosphatase: 43 U/L (ref 38–126)
Anion gap: 7 (ref 5–15)
BILIRUBIN TOTAL: 0.6 mg/dL (ref 0.3–1.2)
BUN: 23 mg/dL — AB (ref 6–20)
CALCIUM: 9.3 mg/dL (ref 8.9–10.3)
CO2: 25 mmol/L (ref 22–32)
CREATININE: 1 mg/dL (ref 0.61–1.24)
Chloride: 109 mmol/L (ref 101–111)
Glucose, Bld: 90 mg/dL (ref 65–99)
Potassium: 4.4 mmol/L (ref 3.5–5.1)
Sodium: 141 mmol/L (ref 135–145)
TOTAL PROTEIN: 6.9 g/dL (ref 6.5–8.1)

## 2016-02-08 LAB — CEA: CEA: 1 ng/mL (ref 0.0–4.7)

## 2016-02-14 ENCOUNTER — Encounter: Payer: Self-pay | Admitting: Oncology

## 2016-02-14 ENCOUNTER — Inpatient Hospital Stay (HOSPITAL_BASED_OUTPATIENT_CLINIC_OR_DEPARTMENT_OTHER): Payer: PPO | Admitting: Oncology

## 2016-02-14 VITALS — BP 134/80 | HR 53 | Temp 98.1°F | Ht 69.0 in | Wt 195.2 lb

## 2016-02-14 DIAGNOSIS — Z8546 Personal history of malignant neoplasm of prostate: Secondary | ICD-10-CM

## 2016-02-14 DIAGNOSIS — Z809 Family history of malignant neoplasm, unspecified: Secondary | ICD-10-CM

## 2016-02-14 DIAGNOSIS — Z86010 Personal history of colonic polyps: Secondary | ICD-10-CM | POA: Diagnosis not present

## 2016-02-14 DIAGNOSIS — Z8614 Personal history of Methicillin resistant Staphylococcus aureus infection: Secondary | ICD-10-CM | POA: Diagnosis not present

## 2016-02-14 DIAGNOSIS — Z7982 Long term (current) use of aspirin: Secondary | ICD-10-CM

## 2016-02-14 DIAGNOSIS — C181 Malignant neoplasm of appendix: Secondary | ICD-10-CM | POA: Diagnosis not present

## 2016-02-14 DIAGNOSIS — Z79899 Other long term (current) drug therapy: Secondary | ICD-10-CM

## 2016-02-14 NOTE — Progress Notes (Signed)
Patient here for follow up. No concerns today. 

## 2016-02-14 NOTE — Progress Notes (Signed)
[place] @ [facility] [phone]  [phone]   DOS: 08/08/14  Edward Mccoy DOB: 1951-03-14  MR#: [id number]  TOI#:[id number]  Patient Care Team: [name]., MD as PCP - General (Family Medicine) [name]., MD (Family Medicine) Seeplaputhur Robinette Haines, MD (General Surgery)  CHIEF COMPLAINT:  No chief complaint on file.   Oncology History   Carcinoma of appendix. Mucinouscarcinoma no evidence of invasion or extra appendiceal tumor present status post appendectomyin April of 2016     Malignant neoplasm of appendix ([place])   11/20/2014 Initial Diagnosis Malignant neoplasm of appendix    No flowsheet data found.  INTERVAL HISTORY: Patient is here for further follow up regarding history of carcinoma of the appendix. Patient is status post appendectomy in April 2016, there was no evidence of invasion or extra appendiceal tumor present. The patient reports overall feeling very well with no acute complaints. He denies any abdominal pain, nausea, vomiting, or diarrhea. Otherwise patient reports doing very well with no new hospitalizations or issues.  REVIEW OF SYSTEMS:   GENERAL:  Feels good.  Active.  No fevers, sweats or weight loss. PERFORMANCE STATUS (ECOG):  0 HEENT:  No visual changes, runny nose, sore throat, mouth sores or tenderness. Lungs: No shortness of breath or cough.  No hemoptysis. Cardiac:  No chest pain, palpitations, orthopnea, or PND. GI:  No nausea, vomiting, diarrhea, constipation, melena or hematochezia. GU:  No urgency, frequency, dysuria, or hematuria. Musculoskeletal:  No back pain.  No joint pain.  No muscle tenderness. Extremities:  No pain or swelling. Skin:  No rashes or skin changes. Neuro:  No headache, numbness or weakness, balance or coordination issues. Endocrine:  No diabetes, thyroid issues, hot flashes or night sweats. Psych:  No mood changes, depression or anxiety. Pain:  No focal pain. Review of systems:   All other systems reviewed and found to be negative. As per HPI. Otherwise, a complete review of systems is negatve.  PAST MEDICAL HISTORY: Past Medical History  Diagnosis Date  . Colon polyp   . Cancer [facility]) 2008    prostate  . Cancer of appendix ([place]) 11/15/14    high-grade appendiceal mucinous neoplasm  . MRSA infection 05/2015    Right Kneee    PAST SURGICAL HISTORY: Past Surgical History  Procedure Laterality Date  . Prostate surgery  June 2008  . Shoulder surgery  Nov 1999, Oct 2015  . Colonoscopy  07-09-06    Dr Allen Norris  . Appendectomy  11/15/14    patient report it was cancerous     FAMILY HISTORY Family History  Problem Relation Age of Onset  . Cancer Mother   . Diabetes Father   . Parkinson's disease Father     ADVANCED DIRECTIVES:  No flowsheet data found.  HEALTH MAINTENANCE: Social History  Substance Use Topics  . Smoking status: Never Smoker   . Smokeless tobacco: Never Used  . Alcohol Use: 0.0 oz/week    0 Standard drinks or equivalent per week     Comment: occasionally      No Known Allergies  Current Outpatient Prescriptions  Medication Sig Dispense Refill  . alprostadil (EDEX) 20 MCG injection USE AS DIRECTED NO MORE THAN TIMES PER WEEK    . Ascorbic Acid (VITAMIN C) 1000 MG tablet Take 1,000 mg by mouth daily.    Marland Kitchen aspirin EC 81 MG tablet Take 81 mg by mouth daily.    Marland Kitchen CALCIUM POLYCARBOPHIL PO Take by mouth.    Marland Kitchen  celecoxib (CELEBREX) 200 MG capsule TAKE 1 CAPSULE EVERY DAY AS NEEDED 30 capsule 5  . Multiple Vitamins-Minerals (MULTIVITAMIN ADULT PO) Take by mouth daily.    . Omega-3 Fatty Acids (FISH OIL) 1200 MG CAPS Take by mouth.    . simvastatin (ZOCOR) 10 MG tablet TAKE ONE TABLET AT BEDTIME 90 tablet 1  . tadalafil (CIALIS) 20 MG tablet Take by mouth.    . vitamin E 400 UNIT capsule Take 400 Units by mouth daily.     No current facility-administered medications for this visit.    OBJECTIVE:  There were no vitals filed for this  visit.   There is no weight on file to calculate BMI.    ECOG FS:0 - Asymptomatic  PHYSICAL EXAM: GENERAL:  Well developed, well nourished, sitting comfortably in the exam room in no acute distress. MENTAL STATUS:  Alert and oriented to person, place and time. HEAD:  Normocephalic, atraumatic, face symmetric, no Cushingoid features. EYES:  .  Pupils equal round and reactive to light and accomodation.  No conjunctivitis or scleral icterus. ENT:  Oropharynx clear without lesion.  Tongue normal. Mucous membranes moist.  RESPIRATORY:  Clear to auscultation without rales, wheezes or rhonchi. CARDIOVASCULAR:  Regular rate and rhythm without murmur, rub or gallop. ABDOMEN:  Soft, non-tender, with active bowel sounds, and no hepatosplenomegaly.  No masses. SKIN:  No rashes, ulcers or lesions. LYMPH NODES: No palpable cervical, supraclavicular, axillary or inguinal adenopathy  NEUROLOGICAL: Unremarkable. PSYCH:  Appropriate.  LAB RESULTS:  No visits with results within 2 Day(s) from this visit. Latest known visit with results is:  Orders Only on 02/07/2016  Component Date Value Ref Range Status  . CEA 02/07/2016 1.0  0.0 - 4.7 ng/mL Final   Comment: (NOTE)       Roche ECLIA methodology       Nonsmokers  <3.9                                     Smokers     <5.6 Performed At: [facility] [place], [place] [id number] Lindon Romp MD [id number]   . WBC 02/07/2016 7.6  3.8 - 10.6 K/uL Final  . RBC 02/07/2016 4.45  4.40 - 5.90 MIL/uL Final  . Hemoglobin 02/07/2016 14.3  13.0 - 18.0 g/dL Final  . HCT 02/07/2016 41.0  40.0 - 52.0 % Final  . MCV 02/07/2016 92.2  80.0 - 100.0 fL Final  . MCH 02/07/2016 32.1  26.0 - 34.0 pg Final  . MCHC 02/07/2016 34.8  32.0 - 36.0 g/dL Final  . RDW 02/07/2016 13.0  11.5 - 14.5 % Final  . Platelets 02/07/2016 194  150 - 440 K/uL Final  . Neutrophils Relative % 02/07/2016 57   Final  . Neutro Abs 02/07/2016 4.3  1.4 - 6.5 K/uL Final   . Lymphocytes Relative 02/07/2016 25   Final  . Lymphs Abs 02/07/2016 1.9  1.0 - 3.6 K/uL Final  . Monocytes Relative 02/07/2016 10   Final  . Monocytes Absolute 02/07/2016 0.8  0.2 - 1.0 K/uL Final  . Eosinophils Relative 02/07/2016 7   Final  . Eosinophils Absolute 02/07/2016 0.5  0 - 0.7 K/uL Final  . Basophils Relative 02/07/2016 1   Final  . Basophils Absolute 02/07/2016 0.0  0 - 0.1 K/uL Final  . Sodium 02/07/2016 141  135 - 145 mmol/L Final  . Potassium 02/07/2016  4.4  3.5 - 5.1 mmol/L Final  . Chloride 02/07/2016 109  101 - 111 mmol/L Final  . CO2 02/07/2016 25  22 - 32 mmol/L Final  . Glucose, Bld 02/07/2016 90  65 - 99 mg/dL Final  . BUN 02/07/2016 23* 6 - 20 mg/dL Final  . Creatinine, Ser 02/07/2016 1.00  0.61 - 1.24 mg/dL Final  . Calcium 02/07/2016 9.3  8.9 - 10.3 mg/dL Final  . Total Protein 02/07/2016 6.9  6.5 - 8.1 g/dL Final  . Albumin 02/07/2016 4.2  3.5 - 5.0 g/dL Final  . AST 02/07/2016 31  15 - 41 U/L Final  . ALT 02/07/2016 30  17 - 63 U/L Final  . Alkaline Phosphatase 02/07/2016 43  38 - 126 U/L Final  . Total Bilirubin 02/07/2016 0.6  0.3 - 1.2 mg/dL Final  . GFR calc non Af Amer 02/07/2016 >60  >60 mL/min Final  . GFR calc Af Amer 02/07/2016 >60  >60 mL/min Final   Comment: (NOTE) The eGFR has been calculated using the CKD EPI equation. This calculation has not been validated in all clinical situations. eGFR's persistently <60 mL/min signify possible Chronic Kidney Disease.   . Anion gap 02/07/2016 7  5 - 15 Final      ASSESSMENT: Carcinoma of appendix.  MEDICAL DECISION MAKING:  1. Carcinoma of appendix. Clinically there is no evidence of recurrent disease. CEA last collected February 07, 2016 reported as 1.0. CT scan October 29, 2015 reports no metastatic disease. Return to clinic in 6 months with labs one week prior to visit.   Patient expressed understanding and was in agreement with this plan. He also understands that He can call clinic at any time  with any questions, concerns, or complaints.   Dr. Rogue Bussing was available for consultation and review of plan of care for this patient.  Mayra Reel, NP  08/09/2015 2:09 PM

## 2016-02-19 DIAGNOSIS — Z8546 Personal history of malignant neoplasm of prostate: Secondary | ICD-10-CM | POA: Diagnosis not present

## 2016-02-19 DIAGNOSIS — Z6827 Body mass index (BMI) 27.0-27.9, adult: Secondary | ICD-10-CM | POA: Diagnosis not present

## 2016-05-16 DIAGNOSIS — M79671 Pain in right foot: Secondary | ICD-10-CM | POA: Diagnosis not present

## 2016-05-16 DIAGNOSIS — M722 Plantar fascial fibromatosis: Secondary | ICD-10-CM | POA: Diagnosis not present

## 2016-06-16 DIAGNOSIS — M722 Plantar fascial fibromatosis: Secondary | ICD-10-CM | POA: Diagnosis not present

## 2016-06-23 ENCOUNTER — Other Ambulatory Visit: Payer: Self-pay | Admitting: Physician Assistant

## 2016-06-23 DIAGNOSIS — E7800 Pure hypercholesterolemia, unspecified: Secondary | ICD-10-CM

## 2016-08-14 ENCOUNTER — Ambulatory Visit (INDEPENDENT_AMBULATORY_CARE_PROVIDER_SITE_OTHER): Payer: PPO | Admitting: Family Medicine

## 2016-08-14 DIAGNOSIS — C61 Malignant neoplasm of prostate: Secondary | ICD-10-CM

## 2016-08-14 DIAGNOSIS — E041 Nontoxic single thyroid nodule: Secondary | ICD-10-CM

## 2016-08-14 DIAGNOSIS — E785 Hyperlipidemia, unspecified: Secondary | ICD-10-CM | POA: Diagnosis not present

## 2016-08-14 DIAGNOSIS — Z23 Encounter for immunization: Secondary | ICD-10-CM | POA: Diagnosis not present

## 2016-08-14 NOTE — Progress Notes (Signed)
Patient: Edward Mccoy, Male    DOB: 1951-04-13, 66 y.o.   MRN: [id number] Visit Date: 08/25/2016  Today's Provider: [name], MD     Chief Complaint  Patient presents with  . Annual Exam   Subjective:   Edward Mccoy is a 66 y.o. male who presents today for his Subsequent Annual Wellness Visit. He feels well. He reports exercising 4-5 times weekly. He reports he is sleeping well.  Immunization History  Administered Date(s) Administered  . Influenza, High Dose Seasonal PF 08/14/2016  . Influenza,inj,Quad PF,36+ Mos 08/14/2015  . Pneumococcal Conjugate-13 08/14/2015  . Td 01/03/2005  . Tdap 07/24/2011  . Zoster 08/09/2013   07/09/06 Colonoscopy-hyperplastic polyps  Review of Systems  Constitutional: Negative.   HENT: Negative.   Eyes: Negative.   Respiratory: Negative.   Cardiovascular: Negative.   Gastrointestinal: Negative.   Endocrine: Negative.   Genitourinary: Negative.   Musculoskeletal: Negative.   Skin: Negative.   Allergic/Immunologic: Negative.   Neurological: Negative.   Hematological: Negative.   Psychiatric/Behavioral: Negative.     Patient Active Problem List   Diagnosis Date Noted  . HLD (hyperlipidemia) 07/26/2015  . Arthritis, degenerative 07/26/2015  . Thyroid nodule 07/26/2015  . Malignant neoplasm of appendix ([place]) 11/20/2014  . ED (erectile dysfunction) of organic origin 01/31/2013  . H/O malignant neoplasm of prostate 01/31/2013    Social History   Social History  . Marital status: Married    Spouse name: N/A  . Number of children: N/A  . Years of education: N/A   Occupational History  . Not on file.   Social History Main Topics  . Smoking status: Never Smoker  . Smokeless tobacco: Never Used  . Alcohol use 0.0 oz/week     Comment: occasionally  . Drug use: No  . Sexual activity: Not on file   Other Topics Concern  . Not on file   Social History Narrative  . No narrative on file    Past Surgical History:   Procedure Laterality Date  . APPENDECTOMY  11/15/14   patient report it was cancerous   . COLONOSCOPY  07-09-06   Dr Allen Norris  . PROSTATE SURGERY  June 2008  . SHOULDER SURGERY  Nov 1999, Oct 2015    His family history includes Cancer in his mother; Diabetes in his father; Parkinson's disease in his father.     Outpatient Encounter Prescriptions as of 08/14/2016  Medication Sig Note  . Ascorbic Acid (VITAMIN C) 1000 MG tablet Take 1,000 mg by mouth daily. 11/14/2014: Received from: [place]:   . aspirin EC 81 MG tablet Take 81 mg by mouth daily. 11/14/2014: Received from: [place]:   . celecoxib (CELEBREX) 200 MG capsule TAKE 1 CAPSULE EVERY DAY AS NEEDED   . Multiple Vitamins-Minerals (MULTIVITAMIN ADULT PO) Take by mouth daily.   . Omega-3 Fatty Acids (FISH OIL) 1200 MG CAPS Take by mouth. 07/09/2015: Received from: [place]  . simvastatin (ZOCOR) 10 MG tablet TAKE ONE TABLET AT BEDTIME   . tadalafil (CIALIS) 20 MG tablet Take 10 mg by mouth daily as needed.  07/09/2015: Received from: [place]  . vitamin E 400 UNIT capsule Take 400 Units by mouth daily.   . [DISCONTINUED] alprostadil (EDEX) 20 MCG injection USE AS DIRECTED NO MORE THAN TIMES PER WEEK 07/09/2015: Received from: [facility]  . [DISCONTINUED] CALCIUM POLYCARBOPHIL PO Take by mouth. 07/26/2015: Received from: [facility]  No facility-administered encounter medications on file as of 08/14/2016.     No Known Allergies  Patient Care Team: [name]., MD as PCP - General (Family Medicine) [name]., MD (Family Medicine) Seeplaputhur Robinette Haines, MD (General Surgery)   Objective:   Vitals: There were no vitals filed for this visit.  Physical Exam  Constitutional: He is oriented to person, place, and time. He appears well-developed and well-nourished.  HENT:  Head: Normocephalic and atraumatic.   Right Ear: External ear normal.  Left Ear: External ear normal.  Nose: Nose normal.  Mouth/Throat: Oropharynx is clear and moist.  Eyes: Conjunctivae and EOM are normal. Pupils are equal, round, and reactive to light.  Neck: Normal range of motion. Neck supple.  Cardiovascular: Normal rate, regular rhythm, normal heart sounds and intact distal pulses.   Pulmonary/Chest: Effort normal and breath sounds normal.  Abdominal: Soft. Bowel sounds are normal.  Genitourinary: Rectum normal, prostate normal and penis normal.  Musculoskeletal: Normal range of motion.  Neurological: He is alert and oriented to person, place, and time.  Skin: Skin is warm and dry.  Psychiatric: He has a normal mood and affect. His behavior is normal. Judgment and thought content normal.    Activities of Daily Living In your present state of health, do you have any difficulty performing the following activities: 08/14/2016  Hearing? N  Vision? N  Difficulty concentrating or making decisions? N  Walking or climbing stairs? N  Dressing or bathing? N  Doing errands, shopping? N  Some recent data might be hidden    Fall Risk Assessment Fall Risk  08/14/2016 08/14/2015  Falls in the past year? No No     Depression Screen PHQ 2/9 Scores 08/14/2016 08/14/2015  PHQ - 2 Score 0 0    Cognitive Testing - 6-CIT  Correct? Score   What year is it? yes 0 0 or 4  What month is it? yes 0 0 or 3  Memorize:    [place], [place],  41,  [address]  [place],      What time is it? (within 1 hour) yes 0 0 or 3  Count backwards from 20 yes 0 0, 2, or 4  Name the months of the year yes 0 0, 2, or 4  Repeat name & address above yes 0 0, 2, 4, 6, 8, or 10       TOTAL SCORE  2/28   Interpretation:  Normal  Normal (0-7) Abnormal (8-28)       Assessment & Plan:     Annual Wellness Visit  Reviewed patient's Family Medical History Reviewed and updated list of patient's medical providers Assessment of cognitive impairment was  done Assessed patient's functional ability Established a written schedule for health screening [place] Completed and Reviewed  Exercise Activities and Dietary recommendations Goals    None      Immunization History  Administered Date(s) Administered  . Influenza, High Dose Seasonal PF 08/14/2016  . Influenza,inj,Quad PF,36+ Mos 08/14/2015  . Pneumococcal Conjugate-13 08/14/2015  . Td 01/03/2005  . Tdap 07/24/2011  . Zoster 08/09/2013    Health Maintenance  Topic Date Due  . Hepatitis C Screening  10-Jun-1951  . COLONOSCOPY  07/09/2016  . PNA vac Low Risk Adult (2 of 2 - PPSV23) 08/13/2016  . TETANUS/TDAP  07/23/2021  . INFLUENZA VACCINE  Completed  . ZOSTAVAX  Completed     Discussed health benefits of physical activity, and encouraged him  to engage in regular exercise appropriate for his age and condition.  History of prostate cancer and cancer of the appendix/colon cancer Clinical follow-up is been good. Check labs for above issues. Hyperlipidemia Osteoarthritis    HPI, Exam and A&P Transcribed under the direction and in the presence of Miguel Aschoff, Brooke Bonito., MD. Electronically Signed: Althea Charon, RMA I have done the exam and reviewed the chart and it is accurate to the best of my knowledge. Development worker, community has been used and  any errors in dictation or transcription are unintentional. Miguel Aschoff M.D. [facility]

## 2016-08-15 ENCOUNTER — Inpatient Hospital Stay: Payer: PPO | Attending: Internal Medicine

## 2016-08-15 ENCOUNTER — Other Ambulatory Visit: Payer: Self-pay

## 2016-08-15 DIAGNOSIS — C61 Malignant neoplasm of prostate: Secondary | ICD-10-CM | POA: Insufficient documentation

## 2016-08-15 DIAGNOSIS — Z7982 Long term (current) use of aspirin: Secondary | ICD-10-CM | POA: Insufficient documentation

## 2016-08-15 DIAGNOSIS — Z86010 Personal history of colonic polyps: Secondary | ICD-10-CM | POA: Diagnosis not present

## 2016-08-15 DIAGNOSIS — Z8589 Personal history of malignant neoplasm of other organs and systems: Secondary | ICD-10-CM | POA: Diagnosis not present

## 2016-08-15 DIAGNOSIS — Z79899 Other long term (current) drug therapy: Secondary | ICD-10-CM | POA: Insufficient documentation

## 2016-08-15 DIAGNOSIS — Z9049 Acquired absence of other specified parts of digestive tract: Secondary | ICD-10-CM | POA: Diagnosis not present

## 2016-08-15 DIAGNOSIS — E041 Nontoxic single thyroid nodule: Secondary | ICD-10-CM

## 2016-08-15 DIAGNOSIS — Z8614 Personal history of Methicillin resistant Staphylococcus aureus infection: Secondary | ICD-10-CM | POA: Diagnosis not present

## 2016-08-15 DIAGNOSIS — C181 Malignant neoplasm of appendix: Secondary | ICD-10-CM

## 2016-08-15 DIAGNOSIS — Z8546 Personal history of malignant neoplasm of prostate: Secondary | ICD-10-CM

## 2016-08-15 LAB — CBC WITH DIFFERENTIAL/PLATELET
BASOS PCT: 0 %
Basophils Absolute: 0 10*3/uL (ref 0–0.1)
EOS PCT: 1 %
Eosinophils Absolute: 0.1 10*3/uL (ref 0–0.7)
HCT: 41.7 % (ref 40.0–52.0)
HEMOGLOBIN: 14.2 g/dL (ref 13.0–18.0)
LYMPHS ABS: 1.5 10*3/uL (ref 1.0–3.6)
Lymphocytes Relative: 15 %
MCH: 31.4 pg (ref 26.0–34.0)
MCHC: 34.1 g/dL (ref 32.0–36.0)
MCV: 92 fL (ref 80.0–100.0)
Monocytes Absolute: 0.8 10*3/uL (ref 0.2–1.0)
Monocytes Relative: 9 %
NEUTROS PCT: 75 %
Neutro Abs: 7.3 10*3/uL — ABNORMAL HIGH (ref 1.4–6.5)
PLATELETS: 179 10*3/uL (ref 150–440)
RBC: 4.53 MIL/uL (ref 4.40–5.90)
RDW: 12.7 % (ref 11.5–14.5)
WBC: 9.7 10*3/uL (ref 3.8–10.6)

## 2016-08-15 LAB — COMPREHENSIVE METABOLIC PANEL
ALK PHOS: 27 U/L — AB (ref 38–126)
ALT: 28 U/L (ref 17–63)
AST: 26 U/L (ref 15–41)
Albumin: 4.5 g/dL (ref 3.5–5.0)
Anion gap: 5 (ref 5–15)
BILIRUBIN TOTAL: 1.1 mg/dL (ref 0.3–1.2)
BUN: 17 mg/dL (ref 6–20)
CALCIUM: 8.9 mg/dL (ref 8.9–10.3)
CHLORIDE: 105 mmol/L (ref 101–111)
CO2: 27 mmol/L (ref 22–32)
CREATININE: 0.91 mg/dL (ref 0.61–1.24)
Glucose, Bld: 102 mg/dL — ABNORMAL HIGH (ref 65–99)
Potassium: 4.1 mmol/L (ref 3.5–5.1)
Sodium: 137 mmol/L (ref 135–145)
Total Protein: 7 g/dL (ref 6.5–8.1)

## 2016-08-15 LAB — PSA: PSA: 0.08 ng/mL (ref 0.00–4.00)

## 2016-08-15 LAB — LIPID PANEL
Cholesterol: 197 mg/dL (ref 0–200)
HDL: 59 mg/dL (ref >40)
LDL CALC: 120 mg/dL — AB (ref 0–99)
TRIGLYCERIDES: 88 mg/dL (ref <150)
Total CHOL/HDL Ratio: 3.3 RATIO
VLDL: 18 mg/dL (ref 0–40)

## 2016-08-15 LAB — TSH: TSH: 1.23 u[IU]/mL (ref 0.350–4.500)

## 2016-08-16 LAB — CEA: CEA: 0.9 ng/mL (ref 0.0–4.7)

## 2016-08-22 ENCOUNTER — Inpatient Hospital Stay (HOSPITAL_BASED_OUTPATIENT_CLINIC_OR_DEPARTMENT_OTHER): Payer: PPO | Admitting: Internal Medicine

## 2016-08-22 VITALS — BP 114/72 | HR 60 | Temp 97.4°F | Wt 197.2 lb

## 2016-08-22 DIAGNOSIS — Z86010 Personal history of colonic polyps: Secondary | ICD-10-CM | POA: Diagnosis not present

## 2016-08-22 DIAGNOSIS — C61 Malignant neoplasm of prostate: Secondary | ICD-10-CM

## 2016-08-22 DIAGNOSIS — Z7982 Long term (current) use of aspirin: Secondary | ICD-10-CM

## 2016-08-22 DIAGNOSIS — C181 Malignant neoplasm of appendix: Secondary | ICD-10-CM

## 2016-08-22 DIAGNOSIS — Z79899 Other long term (current) drug therapy: Secondary | ICD-10-CM

## 2016-08-22 DIAGNOSIS — Z8614 Personal history of Methicillin resistant Staphylococcus aureus infection: Secondary | ICD-10-CM | POA: Diagnosis not present

## 2016-08-22 DIAGNOSIS — Z8589 Personal history of malignant neoplasm of other organs and systems: Secondary | ICD-10-CM

## 2016-08-22 DIAGNOSIS — Z9049 Acquired absence of other specified parts of digestive tract: Secondary | ICD-10-CM | POA: Diagnosis not present

## 2016-08-22 NOTE — Progress Notes (Signed)
Patient here today for follow up.  Patient states no new concerns today  

## 2016-08-22 NOTE — Assessment & Plan Note (Addendum)
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 CEA normal. Labs normal.  # Prostate cancer-clinically no evidence of recurrence as the patient most recent PSA undetectable. followed by Dr.Stoiff   # Follow-up in 6 months with labs- few days prior.

## 2016-08-22 NOTE — Progress Notes (Signed)
[place] OFFICE PROGRESS NOTE  Patient Care Team: [name]., MD as PCP - General (Family Medicine) [name]., MD (Family Medicine) Seeplaputhur Robinette Haines, MD (General Surgery)  Cancer Staging No matching staging information was found for the patient.   Oncology History   # April 2016-Carcinoma of appendix. Mucinouscarcinoma no evidence of invasion or extra appendiceal tumor present status post appendectomy[stage I;No adjuvant chmeo.  Dr.Sankar]  # Prostate cancer 2008 s/p Prostatectomy [Dr.Stoiff].      Malignant neoplasm of appendix ([place])   11/20/2014 Initial Diagnosis    Malignant neoplasm of appendix        This is my first interaction with the patient as patient's primary oncologist has been Dr.Choksi. I reviewed the patient's prior charts/pertinent labs/imaging in detail; findings are summarized above.     INTERVAL HISTORY:  Edward Mccoy 66 y.o.  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  Patient denies any abdominal pain constipation or distention. Appetite is good. No weight loss. Denies any bone pain.  REVIEW OF SYSTEMS:  A complete 10 point review of system is done which is negative except mentioned above/history of present illness.   PAST MEDICAL HISTORY :  Past Medical History:  Diagnosis Date  . Cancer [facility]) 2008   prostate  . Cancer of appendix ([place]) 11/15/14   high-grade appendiceal mucinous neoplasm  . Colon polyp   . MRSA infection 05/2015   Right Kneee    PAST SURGICAL HISTORY :   Past Surgical History:  Procedure Laterality Date  . APPENDECTOMY  11/15/14   patient report it was cancerous   . COLONOSCOPY  07-09-06   Dr Allen Norris  . PROSTATE SURGERY  June 2008  . SHOULDER SURGERY  Nov 1999, Oct 2015    FAMILY HISTORY :   Family History  Problem Relation Age of Onset  . Cancer Mother   . Diabetes Father   . Parkinson's disease Father     SOCIAL  HISTORY:   Social History  Substance Use Topics  . Smoking status: Never Smoker  . Smokeless tobacco: Never Used  . Alcohol use 0.0 oz/week     Comment: occasionally    ALLERGIES:  has No Known Allergies.  MEDICATIONS:  Current Outpatient Prescriptions  Medication Sig Dispense Refill  . Ascorbic Acid (VITAMIN C) 1000 MG tablet Take 1,000 mg by mouth daily.    Marland Kitchen aspirin EC 81 MG tablet Take 81 mg by mouth daily.    . celecoxib (CELEBREX) 200 MG capsule TAKE 1 CAPSULE EVERY DAY AS NEEDED 30 capsule 5  . Multiple Vitamins-Minerals (MULTIVITAMIN ADULT PO) Take by mouth daily.    . Omega-3 Fatty Acids (FISH OIL) 1200 MG CAPS Take by mouth.    . simvastatin (ZOCOR) 10 MG tablet TAKE ONE TABLET AT BEDTIME 90 tablet 1  . tadalafil (CIALIS) 20 MG tablet Take 10 mg by mouth daily as needed.     . vitamin E 400 UNIT capsule Take 400 Units by mouth daily.     No current facility-administered medications for this visit.     PHYSICAL EXAMINATION: ECOG PERFORMANCE STATUS: 0 - Asymptomatic  BP 114/72 (BP Location: Right Arm, Patient Position: Sitting)   Pulse 60   Temp 97.4 F (36.3 C) (Tympanic)   Wt 197 lb 4 oz (89.5 kg)   BMI 29.13 kg/m   Filed Weights   08/22/16 1433  Weight: 197 lb 4 oz (89.5 kg)  GENERAL: Well-nourished well-developed; Alert, no distress and comfortable.   Alone.  EYES: no pallor or icterus OROPHARYNX: no thrush or ulceration; good dentition  NECK: supple, no masses felt LYMPH:  no palpable lymphadenopathy in the cervical, axillary or inguinal regions LUNGS: clear to auscultation and  No wheeze or crackles HEART/CVS: regular rate & rhythm and no murmurs; No lower extremity edema ABDOMEN:abdomen soft, non-tender and normal bowel sounds Musculoskeletal:no cyanosis of digits and no clubbing  PSYCH: alert & oriented x 3 with fluent speech NEURO: no focal motor/sensory deficits SKIN:  no rashes or significant lesions  LABORATORY DATA:  I have reviewed the  data as listed    Component Value Date/Time   NA 137 08/15/2016 1010   NA 143 08/20/2015 1018   NA 139 11/13/2014 1553   K 4.1 08/15/2016 1010   K 3.9 11/13/2014 1553   CL 105 08/15/2016 1010   CL 104 11/13/2014 1553   CO2 27 08/15/2016 1010   CO2 28 11/13/2014 1553   GLUCOSE 102 (H) 08/15/2016 1010   GLUCOSE 93 11/13/2014 1553   BUN 17 08/15/2016 1010   BUN 21 08/20/2015 1018   BUN 18 11/13/2014 1553   CREATININE 0.91 08/15/2016 1010   CREATININE 1.02 11/13/2014 1553   CALCIUM 8.9 08/15/2016 1010   CALCIUM 9.1 11/13/2014 1553   PROT 7.0 08/15/2016 1010   PROT 6.7 08/20/2015 1018   PROT 7.3 11/13/2014 1553   ALBUMIN 4.5 08/15/2016 1010   ALBUMIN 4.5 08/20/2015 1018   ALBUMIN 4.6 11/13/2014 1553   AST 26 08/15/2016 1010   AST 30 11/13/2014 1553   ALT 28 08/15/2016 1010   ALT 34 11/13/2014 1553   ALKPHOS 27 (L) 08/15/2016 1010   ALKPHOS 39 11/13/2014 1553   BILITOT 1.1 08/15/2016 1010   BILITOT 0.4 08/20/2015 1018   BILITOT 0.9 11/13/2014 1553   GFRNONAA >60 08/15/2016 1010   GFRNONAA >60 11/13/2014 1553   GFRAA >60 08/15/2016 1010   GFRAA >60 11/13/2014 1553    No results found for: SPEP, UPEP  Lab Results  Component Value Date   WBC 9.7 08/15/2016   NEUTROABS 7.3 (H) 08/15/2016   HGB 14.2 08/15/2016   HCT 41.7 08/15/2016   MCV 92.0 08/15/2016   PLT 179 08/15/2016      Chemistry      Component Value Date/Time   NA 137 08/15/2016 1010   NA 143 08/20/2015 1018   NA 139 11/13/2014 1553   K 4.1 08/15/2016 1010   K 3.9 11/13/2014 1553   CL 105 08/15/2016 1010   CL 104 11/13/2014 1553   CO2 27 08/15/2016 1010   CO2 28 11/13/2014 1553   BUN 17 08/15/2016 1010   BUN 21 08/20/2015 1018   BUN 18 11/13/2014 1553   CREATININE 0.91 08/15/2016 1010   CREATININE 1.02 11/13/2014 1553   GLU 102 08/10/2014      Component Value Date/Time   CALCIUM 8.9 08/15/2016 1010   CALCIUM 9.1 11/13/2014 1553   ALKPHOS 27 (L) 08/15/2016 1010   ALKPHOS 39 11/13/2014 1553    AST 26 08/15/2016 1010   AST 30 11/13/2014 1553   ALT 28 08/15/2016 1010   ALT 34 11/13/2014 1553   BILITOT 1.1 08/15/2016 1010   BILITOT 0.4 08/20/2015 1018   BILITOT 0.9 11/13/2014 1553       RADIOGRAPHIC STUDIES: I have personally reviewed the radiological images as listed and agreed with the findings in the report. No results found.   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 CEA normal. Labs normal.  # Prostate cancer-clinically no evidence of recurrence as the patient most recent PSA undetectable. followed by Dr.Stoiff   # Follow-up in 6 months with labs- few days prior.   Orders Placed This Encounter  Procedures  . CBC with Differential/Platelet    Standing Status:   Future    Standing Expiration Date:   08/22/2017  . CEA    Standing Status:   Future    Standing Expiration Date:   08/22/2017  . Comprehensive metabolic panel    Standing Status:   Future    Standing Expiration Date:   08/22/2017   All questions were answered. The patient knows to call the clinic with any problems, questions or concerns.      [name], MD 08/22/2016 3:10 PM

## 2016-09-29 ENCOUNTER — Other Ambulatory Visit: Payer: Self-pay

## 2016-09-29 DIAGNOSIS — C181 Malignant neoplasm of appendix: Secondary | ICD-10-CM

## 2016-10-07 ENCOUNTER — Ambulatory Visit (INDEPENDENT_AMBULATORY_CARE_PROVIDER_SITE_OTHER): Payer: PPO | Admitting: Family Medicine

## 2016-10-07 VITALS — BP 128/60 | HR 60 | Temp 97.8°F | Resp 16 | Wt 202.0 lb

## 2016-10-07 DIAGNOSIS — J40 Bronchitis, not specified as acute or chronic: Secondary | ICD-10-CM | POA: Diagnosis not present

## 2016-10-07 MED ORDER — AZITHROMYCIN 250 MG PO TABS: ORAL_TABLET | ORAL | 0 refills | Status: DC

## 2016-10-07 NOTE — Progress Notes (Signed)
Edward Mccoy  MRN: [id number] DOB: 04/17/1951  Subjective:  HPI The patient is a 66 year old male with a 4 day duration of symptoms including cough, head and chest congestion.  He has not had any fever.  His cough is productive of greenish sputum.  He tried NyQuil last night and was able to sleep.  Patient Active Problem List   Diagnosis Date Noted  . HLD (hyperlipidemia) 07/26/2015  . Arthritis, degenerative 07/26/2015  . Thyroid nodule 07/26/2015  . Malignant neoplasm of appendix ([place]) 11/20/2014  . ED (erectile dysfunction) of organic origin 01/31/2013  . H/O malignant neoplasm of prostate 01/31/2013    Past Medical History:  Diagnosis Date  . Cancer [facility]) 2008   prostate  . Cancer of appendix ([place]) 11/15/14   high-grade appendiceal mucinous neoplasm  . Colon polyp   . MRSA infection 05/2015   Right Kneee    Social History   Social History  . Marital status: Married    Spouse name: N/A  . Number of children: N/A  . Years of education: N/A   Occupational History  . Not on file.   Social History Main Topics  . Smoking status: Never Smoker  . Smokeless tobacco: Never Used  . Alcohol use 0.0 oz/week     Comment: occasionally  . Drug use: No  . Sexual activity: Not on file   Other Topics Concern  . Not on file   Social History Narrative  . No narrative on file    Outpatient Encounter Prescriptions as of 10/07/2016  Medication Sig Note  . Ascorbic Acid (VITAMIN C) 1000 MG tablet Take 1,000 mg by mouth daily. 11/14/2014: Received from: [place]:   . aspirin EC 81 MG tablet Take 81 mg by mouth daily. 11/14/2014: Received from: [place]:   . celecoxib (CELEBREX) 200 MG capsule TAKE 1 CAPSULE EVERY DAY AS NEEDED   . Multiple Vitamins-Minerals (MULTIVITAMIN ADULT PO) Take by mouth daily.   . Omega-3 Fatty Acids (FISH OIL) 1200 MG CAPS Take by mouth. 07/09/2015: Received from: [place]  . simvastatin (ZOCOR) 10  MG tablet TAKE ONE TABLET AT BEDTIME   . tadalafil (CIALIS) 20 MG tablet Take 10 mg by mouth daily as needed.  07/09/2015: Received from: [place]  . vitamin E 400 UNIT capsule Take 400 Units by mouth daily.    No facility-administered encounter medications on file as of 10/07/2016.     No Known Allergies  Review of Systems  Constitutional: Positive for chills and malaise/fatigue. Negative for diaphoresis, fever and weight loss.  HENT: Positive for congestion, sinus pain and sore throat. Negative for ear discharge, ear pain, hearing loss, nosebleeds and tinnitus.   Eyes: Negative for blurred vision, double vision, photophobia, pain, discharge and redness.  Respiratory: Positive for cough and sputum production. Negative for hemoptysis, shortness of breath and wheezing.   Cardiovascular: Negative for chest pain, palpitations, orthopnea and leg swelling.  Neurological: Positive for headaches. Negative for dizziness and weakness.  Endo/Heme/Allergies: Negative.   Psychiatric/Behavioral: Negative.     Objective:  BP 128/60 (BP Location: Right Arm, Patient Position: Sitting, Cuff Size: Normal)   Pulse 60   Temp 97.8 F (36.6 C) (Oral)   Resp 16   Wt 202 lb (91.6 kg)   BMI 29.83 kg/m   Physical Exam  Constitutional: He is oriented to person, place, and time and well-developed, well-nourished, and in no distress.  HENT:  Head:  Normocephalic and atraumatic.  Right Ear: External ear normal.  Left Ear: External ear normal.  Nose: Nose normal.  Mouth/Throat: Oropharynx is clear and moist.  Eyes: Conjunctivae are normal.  Neck: No thyromegaly present.  Cardiovascular: Normal rate, regular rhythm and normal heart sounds.   Pulmonary/Chest: Effort normal and breath sounds normal.  Abdominal: Soft.  Lymphadenopathy:    He has no cervical adenopathy.  Neurological: He is alert and oriented to person, place, and time. Gait normal. GCS score is 15.  Skin: Skin is warm and  dry.  Psychiatric: Mood, memory, affect and judgment normal.    Assessment and Plan :   1. Bronchitis  - azithromycin (ZITHROMAX) 250 MG tablet; 2 PO day 1 then 1 PO day 2, 3, 4 and 5  Dispense: 6 tablet; Refill: 0 2.h/o Colon Cancer and Prostate Cancer  I have done the exam and reviewed the chart and it is accurate to the best of my knowledge. Development worker, community has been used and  any errors in dictation or transcription are unintentional. Miguel Aschoff M.D. [facility]

## 2016-10-29 ENCOUNTER — Ambulatory Visit
Admission: RE | Admit: 2016-10-29 | Discharge: 2016-10-29 | Disposition: A | Discharge status: Home / Self Care | Payer: PPO | Source: Ambulatory Visit | Attending: General Surgery | Admitting: General Surgery

## 2016-10-29 DIAGNOSIS — K439 Ventral hernia without obstruction or gangrene: Secondary | ICD-10-CM | POA: Insufficient documentation

## 2016-10-29 DIAGNOSIS — C181 Malignant neoplasm of appendix: Secondary | ICD-10-CM | POA: Insufficient documentation

## 2016-10-29 DIAGNOSIS — K760 Fatty (change of) liver, not elsewhere classified: Secondary | ICD-10-CM | POA: Diagnosis not present

## 2016-10-29 DIAGNOSIS — Z9079 Acquired absence of other genital organ(s): Secondary | ICD-10-CM | POA: Diagnosis not present

## 2016-10-29 DIAGNOSIS — K5790 Diverticulosis of intestine, part unspecified, without perforation or abscess without bleeding: Secondary | ICD-10-CM | POA: Insufficient documentation

## 2016-10-29 LAB — POCT I-STAT CREATININE: Creatinine, Ser: 1 mg/dL (ref 0.61–1.24)

## 2016-10-29 MED ORDER — IOPAMIDOL (ISOVUE-300) INJECTION 61%
100.0000 mL | Freq: Once | INTRAVENOUS | Status: AC | PRN
  Administered 2016-10-29: 100 mL via INTRAVENOUS

## 2016-10-30 ENCOUNTER — Encounter: Payer: Self-pay | Admitting: *Deleted

## 2016-11-05 ENCOUNTER — Ambulatory Visit (INDEPENDENT_AMBULATORY_CARE_PROVIDER_SITE_OTHER): Payer: PPO | Admitting: General Surgery

## 2016-11-05 ENCOUNTER — Encounter: Payer: Self-pay | Admitting: General Surgery

## 2016-11-05 VITALS — BP 116/64 | HR 62 | Resp 12 | Ht 69.0 in | Wt 200.0 lb

## 2016-11-05 DIAGNOSIS — Z1211 Encounter for screening for malignant neoplasm of colon: Secondary | ICD-10-CM

## 2016-11-05 DIAGNOSIS — C181 Malignant neoplasm of appendix: Secondary | ICD-10-CM

## 2016-11-05 MED ORDER — POLYETHYLENE GLYCOL 3350 17 GM/SCOOP PO POWD: ORAL | 0 refills | Status: DC

## 2016-11-05 NOTE — Progress Notes (Signed)
Patient ID: Edward Edward Mccoy, male   DOB: 06-Jul-1951, 66 y.o.   MRN: [id number]  Chief Complaint  Patient presents with  . Follow-up    CT  scan    HPI Edward Edward Mccoy is a 66 y.o. male.  Her for one year follow up appendiceal cancer. CT scan done 10-29-16. His last colonoscopy was 2007, he would like to have this done this year.No current abdominal or GI symptoms. I have reviewed the history of present illness with the patient.  HPI  Past Medical History:  Diagnosis Date  . Cancer Edward Edward Mccoy [facility]) 2008   prostate  . Cancer of appendix (Edward Edward Mccoy) 11/15/14   high-grade appendiceal mucinous neoplasm  . Colon polyp   . MRSA infection 05/2015   Right Kneee    Past Surgical History:  Procedure Laterality Date  . APPENDECTOMY  11/15/14   patient report it was cancerous   . COLONOSCOPY  07-09-06   Dr Edward Edward Mccoy  . PROSTATE SURGERY  June 2008  . SHOULDER SURGERY  Nov 1999, Oct 2015    Family History  Problem Relation Age of Onset  . Cancer Mother   . Diabetes Father   . Parkinson's disease Father     Social History Social History  Substance Use Topics  . Smoking status: Never Smoker  . Smokeless tobacco: Never Used  . Alcohol use 0.0 oz/week     Comment: occasionally    No Known Allergies  Current Outpatient Prescriptions  Medication Sig Dispense Refill  . Ascorbic Acid (VITAMIN C) 1000 MG tablet Take 1,000 mg by mouth daily.    Marland Kitchen aspirin EC 81 MG tablet Take 81 mg by mouth daily.    . celecoxib (CELEBREX) 200 MG capsule TAKE 1 CAPSULE EVERY DAY AS NEEDED 30 capsule 5  . Multiple Vitamins-Minerals (MULTIVITAMIN ADULT PO) Take by mouth daily.    . Omega-3 Fatty Acids (FISH OIL) 1200 MG CAPS Take by mouth.    . simvastatin (ZOCOR) 10 MG tablet TAKE ONE TABLET AT BEDTIME 90 tablet 1  . tadalafil (CIALIS) 20 MG tablet Take 10 mg by mouth daily as needed.     . vitamin E 400 UNIT capsule Take 400 Units by mouth daily.    . polyethylene glycol powder (GLYCOLAX/MIRALAX) powder 255 grams one bottle for  colonoscopy prep 255 Edward Mccoy 0   No current facility-administered medications for this visit.     Review of Systems Review of Systems  Constitutional: Negative.   Respiratory: Negative.   Cardiovascular: Negative.   Gastrointestinal: Negative for abdominal pain, constipation, diarrhea, nausea and vomiting.    Blood pressure 116/64, pulse 62, resp. rate 12, height 5\' 9"  (1.753 m), weight 200 lb (90.7 kg).  Physical Exam Physical Exam  Constitutional: He is oriented to person, place, and time. He appears well-developed and well-nourished.  HENT:  Mouth/Throat: Oropharynx is clear and moist.  Eyes: Conjunctivae are normal. No scleral icterus.  Neck: Neck supple.  Cardiovascular: Normal rate, regular rhythm and normal heart sounds.   Pulmonary/Chest: Effort normal and breath sounds normal.  Abdominal: Soft. Normal appearance and bowel sounds are normal. There is no hepatomegaly. There is no tenderness.  Lymphadenopathy:    He has no cervical adenopathy.  Neurological: He is alert and oriented to person, place, and time.  Skin: Skin is warm and dry.  Psychiatric: His behavior is normal.    Data Reviewed  CT scan and labs reviewed No evidence of recurrence of appendiceal neoplasm Assessment    Stable exam. 34yrs  post appendectomy with finding of a contained mucinous neoplasm. CEA has been stable and normal.     Plan    Colonoscopy with possible biopsy/polypectomy prn: Information regarding the procedure, including its potential risks and complications (including but not limited to perforation of the bowel, which may require emergency surgery to repair, and bleeding) was verbally given to the patient. Educational information regarding lower intestinal endoscopy was given to the patient. Written instructions for how to complete the bowel prep using Miralax were provided. The importance of drinking ample fluids to avoid dehydration as a result of the prep emphasized.  Continue future  cancer follow ups with Dr Edward Edward Mccoy and here with Dr Edward Edward Mccoy on an as needed.  Patient has been scheduled for a colonoscopy on 11-12-16 at [facility]. This patient has been asked to discontinue fish oil one week prior to procedure. It is okay for patient to continue 81 mg aspirin once daily.      This information has been scribed by Edward Fetch RN, BSN,BC.   Edward Edward Mccoy 11/05/2016, 2:34 PM

## 2016-11-05 NOTE — Patient Instructions (Addendum)
The patient is aware to call back for any questions or concerns. Continue future cancer follow ups with Dr Rogue Bussing and here with Dr Bary Castilla on an as needed.   Colonoscopy, Adult A colonoscopy is an exam to look at the entire large intestine. During the exam, a lubricated, bendable tube is inserted into the anus and then passed into the rectum, colon, and other parts of the large intestine. A colonoscopy is often done as a part of normal colorectal screening or in response to certain symptoms, such as anemia, persistent diarrhea, abdominal pain, and blood in the stool. The exam can help screen for and diagnose medical problems, including:  Tumors.  Polyps.  Inflammation.  Areas of bleeding. Tell a health care provider about:  Any allergies you have.  All medicines you are taking, including vitamins, herbs, eye drops, creams, and over-the-counter medicines.  Any problems you or family members have had with anesthetic medicines.  Any blood disorders you have.  Any surgeries you have had.  Any medical conditions you have.  Any problems you have had passing stool. What are the risks? Generally, this is a safe procedure. However, problems may occur, including:  Bleeding.  A tear in the intestine.  A reaction to medicines given during the exam.  Infection (rare). What happens before the procedure? Eating and drinking restrictions  Follow instructions from your health care provider about eating and drinking, which may include:  A few days before the procedure - follow a low-fiber diet. Avoid nuts, seeds, dried fruit, raw fruits, and vegetables.  1-3 days before the procedure - follow a clear liquid diet. Drink only clear liquids, such as clear broth or bouillon, black coffee or tea, clear juice, clear soft drinks or sports drinks, gelatin dessert, and popsicles. Avoid any liquids that contain red or purple dye.  On the day of the procedure - do not eat or drink anything  during the 2 hours before the procedure, or within the time period that your health care provider recommends. Bowel prep  If you were prescribed an oral bowel prep to clean out your colon:  Take it as told by your health care provider. Starting the day before your procedure, you will need to drink a large amount of medicated liquid. The liquid will cause you to have multiple loose stools until your stool is almost clear or light green.  If your skin or anus gets irritated from diarrhea, you may use these to relieve the irritation:  Medicated wipes, such as adult wet wipes with aloe and vitamin E.  A skin soothing-product like petroleum jelly.  If you vomit while drinking the bowel prep, take a break for up to 60 minutes and then begin the bowel prep again. If vomiting continues and you cannot take the bowel prep without vomiting, call your health care provider. General instructions   Ask your health care provider about changing or stopping your regular medicines. This is especially important if you are taking diabetes medicines or blood thinners.  Plan to have someone take you home from the hospital or clinic. What happens during the procedure?  An IV tube may be inserted into one of your veins.  You will be given medicine to help you relax (sedative).  To reduce your risk of infection:  Your health care team will wash or sanitize their hands.  Your anal area will be washed with soap.  You will be asked to lie on your side with your knees bent.  Your  health care provider will lubricate a long, thin, flexible tube. The tube will have a camera and a light on the end.  The tube will be inserted into your anus.  The tube will be gently eased through your rectum and colon.  Air will be delivered into your colon to keep it open. You may feel some pressure or cramping.  The camera will be used to take images during the procedure.  A small tissue sample may be removed from your body  to be examined under a microscope (biopsy). If any potential problems are found, the tissue will be sent to a lab for testing.  If small polyps are found, your health care provider may remove them and have them checked for cancer cells.  The tube that was inserted into your anus will be slowly removed. The procedure may vary among health care providers and hospitals. What happens after the procedure?  Your blood pressure, heart rate, breathing rate, and blood oxygen level will be monitored until the medicines you were given have worn off.  Do not drive for 24 hours after the exam.  You may have a small amount of blood in your stool.  You may pass gas and have mild abdominal cramping or bloating due to the air that was used to inflate your colon during the exam.  It is up to you to get the results of your procedure. Ask your health care provider, or the department performing the procedure, when your results will be ready. This information is not intended to replace advice given to you by your health care provider. Make sure you discuss any questions you have with your health care provider. Document Released: 07/18/2000 Document Revised: 05/21/2016 Document Reviewed: 10/02/2015 Elsevier Interactive Patient Education  2017 [employer].

## 2016-11-11 ENCOUNTER — Encounter: Payer: Self-pay | Admitting: *Deleted

## 2016-11-12 ENCOUNTER — Ambulatory Visit: Payer: PPO | Admitting: Anesthesiology

## 2016-11-12 ENCOUNTER — Encounter: Payer: Self-pay | Admitting: *Deleted

## 2016-11-12 ENCOUNTER — Encounter
Admission: RE | Disposition: A | Discharge status: Home / Self Care | Payer: Self-pay | Source: Ambulatory Visit | Attending: General Surgery

## 2016-11-12 ENCOUNTER — Ambulatory Visit
Admission: RE | Admit: 2016-11-12 | Discharge: 2016-11-12 | Disposition: A | Discharge status: Home / Self Care | Payer: PPO | Source: Ambulatory Visit | Attending: General Surgery | Admitting: General Surgery

## 2016-11-12 DIAGNOSIS — Z86010 Personal history of colonic polyps: Secondary | ICD-10-CM | POA: Insufficient documentation

## 2016-11-12 DIAGNOSIS — Z8614 Personal history of Methicillin resistant Staphylococcus aureus infection: Secondary | ICD-10-CM | POA: Insufficient documentation

## 2016-11-12 DIAGNOSIS — K5730 Diverticulosis of large intestine without perforation or abscess without bleeding: Secondary | ICD-10-CM | POA: Insufficient documentation

## 2016-11-12 DIAGNOSIS — Z1211 Encounter for screening for malignant neoplasm of colon: Secondary | ICD-10-CM | POA: Diagnosis not present

## 2016-11-12 DIAGNOSIS — D123 Benign neoplasm of transverse colon: Secondary | ICD-10-CM | POA: Diagnosis not present

## 2016-11-12 DIAGNOSIS — Z85038 Personal history of other malignant neoplasm of large intestine: Secondary | ICD-10-CM | POA: Diagnosis not present

## 2016-11-12 DIAGNOSIS — D125 Benign neoplasm of sigmoid colon: Secondary | ICD-10-CM | POA: Diagnosis not present

## 2016-11-12 DIAGNOSIS — Z7982 Long term (current) use of aspirin: Secondary | ICD-10-CM | POA: Diagnosis not present

## 2016-11-12 DIAGNOSIS — K635 Polyp of colon: Secondary | ICD-10-CM | POA: Diagnosis not present

## 2016-11-12 HISTORY — PX: COLONOSCOPY WITH PROPOFOL: SHX5780

## 2016-11-12 SURGERY — COLONOSCOPY WITH PROPOFOL
Anesthesia: General

## 2016-11-12 MED ORDER — SODIUM CHLORIDE 0.9 % IV SOLN
INTRAVENOUS | Status: DC
  Administered 2016-11-12: 1000 mL via INTRAVENOUS

## 2016-11-12 MED ORDER — PROPOFOL 500 MG/50ML IV EMUL
INTRAVENOUS | Status: DC | PRN
  Administered 2016-11-12: 120 ug/kg/min via INTRAVENOUS

## 2016-11-12 MED ORDER — PROPOFOL 10 MG/ML IV BOLUS
INTRAVENOUS | Status: DC | PRN
  Administered 2016-11-12: 70 mg via INTRAVENOUS
  Administered 2016-11-12: 30 mg via INTRAVENOUS

## 2016-11-12 MED ORDER — MIDAZOLAM HCL 2 MG/2ML IJ SOLN
INTRAMUSCULAR | Status: AC
  Filled 2016-11-12: qty 2

## 2016-11-12 MED ORDER — LIDOCAINE 2% (20 MG/ML) 5 ML SYRINGE
INTRAMUSCULAR | Status: DC | PRN
  Administered 2016-11-12: 25 mg via INTRAVENOUS

## 2016-11-12 MED ORDER — MIDAZOLAM HCL 5 MG/5ML IJ SOLN
INTRAMUSCULAR | Status: DC | PRN
  Administered 2016-11-12: 2 mg via INTRAVENOUS

## 2016-11-12 NOTE — Transfer of Care (Signed)
Immediate Anesthesia Transfer of Care Note  Patient: Edward Mccoy  Procedure(s) Performed: Procedure(s): COLONOSCOPY WITH PROPOFOL (N/A)  Patient Location: Endoscopy Unit  Anesthesia Type:General  Level of Consciousness: awake  Airway & Oxygen Therapy: Patient Spontanous Breathing and Patient connected to nasal cannula oxygen  Post-op Assessment: Report given to RN and Post -op Vital signs reviewed and stable  Post vital signs: Reviewed  Last Vitals:  Vitals:   11/12/16 1119 11/12/16 1542  BP: 114/71 94/68  Pulse: (!) 51 (!) 51  Resp: 20 14  Temp: (!) 35.9 C 36.1 C    Last Pain:  Vitals:   11/12/16 1119  TempSrc: Tympanic         Complications: No apparent anesthesia complications

## 2016-11-12 NOTE — Anesthesia Postprocedure Evaluation (Signed)
Anesthesia Post Note  Patient: Edward Mccoy  Procedure(s) Performed: Procedure(s) (LRB): COLONOSCOPY WITH PROPOFOL (N/A)  Patient location during evaluation: Endoscopy Anesthesia Type: General Level of consciousness: awake and alert and oriented Pain management: pain level controlled Vital Signs Assessment: post-procedure vital signs reviewed and stable Respiratory status: spontaneous breathing, nonlabored ventilation and respiratory function stable Cardiovascular status: blood pressure returned to baseline and stable Postop Assessment: no signs of nausea or vomiting Anesthetic complications: no     Last Vitals:  Vitals:   11/12/16 1542 11/12/16 1550  BP: 94/68 112/70  Pulse: (!) 51 (!) 52  Resp: 14 17  Temp: 36.1 C     Last Pain:  Vitals:   11/12/16 1119  TempSrc: Tympanic                 Lorah Kalina

## 2016-11-12 NOTE — Anesthesia Preprocedure Evaluation (Signed)
Anesthesia Evaluation  Patient identified by MRN, date of birth, ID band Patient awake    Reviewed: Allergy & Precautions, H&P , NPO status , Patient's Chart, lab work & pertinent test results, reviewed documented beta blocker date and time   History of Anesthesia Complications Negative for: history of anesthetic complications  Airway Mallampati: I  TM Distance: >3 FB Neck ROM: full    Dental  (+) Dental Advidsory Given, Teeth Intact   Pulmonary neg pulmonary ROS,           Cardiovascular Exercise Tolerance: Good negative cardio ROS       Neuro/Psych negative neurological ROS  negative psych ROS   GI/Hepatic negative GI ROS, Neg liver ROS,   Endo/Other  negative endocrine ROS  Renal/GU negative Renal ROS  negative genitourinary   Musculoskeletal   Abdominal   Peds  Hematology negative hematology ROS (+)   Anesthesia Other Findings Past Medical History: 2008: Cancer ([place])     Comment: prostate 11/15/14: 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 No date: Colon polyp 05/2015: MRSA infection     Comment: Right Kneee   Reproductive/Obstetrics negative OB ROS                             Anesthesia Physical Anesthesia Plan  ASA: II  Anesthesia Plan: General   Post-op Pain Management:    Induction:   Airway Management Planned:   Additional Equipment:   Intra-op Plan:   Post-operative Plan:   Informed Consent: I have reviewed the patients History and Physical, chart, labs and discussed the procedure including the risks, benefits and alternatives for the proposed anesthesia with the patient or authorized representative who has indicated his/her understanding and acceptance.   Dental Advisory Given  Plan Discussed with: Anesthesiologist, CRNA and Surgeon  Anesthesia Plan Comments:         Anesthesia Quick Evaluation

## 2016-11-12 NOTE — Anesthesia Post-op Follow-up Note (Cosign Needed)
Anesthesia QCDR form completed.        

## 2016-11-12 NOTE — Interval H&P Note (Signed)
History and Physical Interval Note:  11/12/2016 11:46 AM  Edward Mccoy  has presented today for surgery, with the diagnosis of SCREENING  The various methods of treatment have been discussed with the patient and family. After consideration of risks, benefits and other options for treatment, the patient has consented to  Procedure(s): COLONOSCOPY WITH PROPOFOL (N/A) as a surgical intervention .  The patient's history has been reviewed, patient examined, no change in status, stable for surgery.  I have reviewed the patient's chart and labs.  Questions were answered to the patient's satisfaction.     Edward Mccoy G

## 2016-11-12 NOTE — H&P (View-Only) (Signed)
Patient ID: Edward Mccoy, male   DOB: 1950/09/21, 66 y.o.   MRN: [id number]  Chief Complaint  Patient presents with  . Follow-up    CT  scan    HPI Edward Mccoy is a 66 y.o. male.  Her for one year follow up appendiceal cancer. CT scan done 10-29-16. His last colonoscopy was 2007, he would like to have this done this year.No current abdominal or GI symptoms. I have reviewed the history of present illness with the patient.  HPI  Past Medical History:  Diagnosis Date  . Cancer [facility]) 2008   prostate  . Cancer of appendix ([place]) 11/15/14   high-grade appendiceal mucinous neoplasm  . Colon polyp   . MRSA infection 05/2015   Right Kneee    Past Surgical History:  Procedure Laterality Date  . APPENDECTOMY  11/15/14   patient report it was cancerous   . COLONOSCOPY  07-09-06   Dr Allen Norris  . PROSTATE SURGERY  June 2008  . SHOULDER SURGERY  Nov 1999, Oct 2015    Family History  Problem Relation Age of Onset  . Cancer Mother   . Diabetes Father   . Parkinson's disease Father     Social History Social History  Substance Use Topics  . Smoking status: Never Smoker  . Smokeless tobacco: Never Used  . Alcohol use 0.0 oz/week     Comment: occasionally    No Known Allergies  Current Outpatient Prescriptions  Medication Sig Dispense Refill  . Ascorbic Acid (VITAMIN C) 1000 MG tablet Take 1,000 mg by mouth daily.    Marland Kitchen aspirin EC 81 MG tablet Take 81 mg by mouth daily.    . celecoxib (CELEBREX) 200 MG capsule TAKE 1 CAPSULE EVERY DAY AS NEEDED 30 capsule 5  . Multiple Vitamins-Minerals (MULTIVITAMIN ADULT PO) Take by mouth daily.    . Omega-3 Fatty Acids (FISH OIL) 1200 MG CAPS Take by mouth.    . simvastatin (ZOCOR) 10 MG tablet TAKE ONE TABLET AT BEDTIME 90 tablet 1  . tadalafil (CIALIS) 20 MG tablet Take 10 mg by mouth daily as needed.     . vitamin E 400 UNIT capsule Take 400 Units by mouth daily.    . polyethylene glycol powder (GLYCOLAX/MIRALAX) powder 255 grams one bottle for  colonoscopy prep 255 g 0   No current facility-administered medications for this visit.     Review of Systems Review of Systems  Constitutional: Negative.   Respiratory: Negative.   Cardiovascular: Negative.   Gastrointestinal: Negative for abdominal pain, constipation, diarrhea, nausea and vomiting.    Blood pressure 116/64, pulse 62, resp. rate 12, height 5\' 9"  (1.753 m), weight 200 lb (90.7 kg).  Physical Exam Physical Exam  Constitutional: He is oriented to person, place, and time. He appears well-developed and well-nourished.  HENT:  Mouth/Throat: Oropharynx is clear and moist.  Eyes: Conjunctivae are normal. No scleral icterus.  Neck: Neck supple.  Cardiovascular: Normal rate, regular rhythm and normal heart sounds.   Pulmonary/Chest: Effort normal and breath sounds normal.  Abdominal: Soft. Normal appearance and bowel sounds are normal. There is no hepatomegaly. There is no tenderness.  Lymphadenopathy:    He has no cervical adenopathy.  Neurological: He is alert and oriented to person, place, and time.  Skin: Skin is warm and dry.  Psychiatric: His behavior is normal.    Data Reviewed  CT scan and labs reviewed No evidence of recurrence of appendiceal neoplasm Assessment    Stable exam. 58yrs  post appendectomy with finding of a contained mucinous neoplasm. CEA has been stable and normal.     Plan    Colonoscopy with possible biopsy/polypectomy prn: Information regarding the procedure, including its potential risks and complications (including but not limited to perforation of the bowel, which may require emergency surgery to repair, and bleeding) was verbally given to the patient. Educational information regarding lower intestinal endoscopy was given to the patient. Written instructions for how to complete the bowel prep using Miralax were provided. The importance of drinking ample fluids to avoid dehydration as a result of the prep emphasized.  Continue future  cancer follow ups with Dr Rogue Bussing and here with Dr Bary Castilla on an as needed.  Patient has been scheduled for a colonoscopy on 11-12-16 at [facility]. This patient has been asked to discontinue fish oil one week prior to procedure. It is okay for patient to continue 81 mg aspirin once daily.      This information has been scribed by Karie Fetch RN, BSN,BC.   Tasnia Spegal G 11/05/2016, 2:34 PM

## 2016-11-12 NOTE — Op Note (Signed)
[facility] Gastroenterology Patient Name: Edward Mccoy Procedure Date: 11/12/2016 2:50 PM MRN: [id number] Account #: [id number] Date of Birth: September 03, 1950 Admit Type: Outpatient Age: 66 Room: [facility] ENDO ROOM 1 Gender: Male Note Status: Finalized Procedure:            Colonoscopy Indications:          Screening for colorectal malignant neoplasm Providers:            Seeplaputhur G. Jamal Collin, MD Referring MD:         Janine Ores. Rosanna Randy, MD (Referring MD) Medicines:            Total IV Anesthesia (TIVA) Complications:        No immediate complications. Procedure:            Pre-Anesthesia Assessment:                       - General anesthesia under the supervision of an                        anesthesiologist was determined to be medically                        necessary for this procedure based on review of the                        patient's medical history, medications, and prior                        anesthesia history.                       After obtaining informed consent, the colonoscope was                        passed under direct vision. Throughout the procedure,                        the patient's blood pressure, pulse, and oxygen                        saturations were monitored continuously. The                        Colonoscope was introduced through the anus and                        advanced to the the cecum, identified by the ileocecal                        valve. The quality of the bowel preparation was good.                        The colonoscopy was performed without difficulty. The                        patient tolerated the procedure well. Findings:      The perianal and digital rectal examinations were normal.      A 3 mm polyp was found in the sigmoid colon. The polyp was sessile. The       polyp was removed with a hot snare.  Polyp resection was incomplete. The       resected tissue was retrieved. Biopsies were taken with a cold forceps        for histology.      Multiple medium-mouthed diverticula were found in the sigmoid colon.      The exam was otherwise without abnormality on direct and retroflexion       views. Impression:           - One 3 mm polyp in the sigmoid colon, removed with a                        hot snare. Incomplete resection. Resected tissue                        retrieved. Biopsied.                       - Diverticulosis in the sigmoid colon.                       - The examination was otherwise normal on direct and                        retroflexion views. Recommendation:       - Discharge patient to home.                       - Resume previous diet.                       - Continue present medications.                       - Await pathology results.                       - Repeat colonoscopy in 5 years for surveillance. Procedure Code(s):    --- Professional ---                       [phone], Colonoscopy, flexible; with removal of tumor(s),                        polyp(s), or other lesion(s) by snare technique Diagnosis Code(s):    --- Professional ---                       Z12.11, Encounter for screening for malignant neoplasm                        of colon                       D12.5, Benign neoplasm of sigmoid colon                       K57.30, Diverticulosis of large intestine without                        perforation or abscess without bleeding CPT copyright 2016 American Medical Association. All rights reserved. The codes documented in this report are preliminary and upon coder review may  be revised to meet current compliance requirements. Christene Lye, MD 11/12/2016 3:53:43 PM This report  has been signed electronically. Number of Addenda: 0 Note Initiated On: 11/12/2016 2:50 PM Scope Withdrawal Time: 0 hours 13 minutes 35 seconds  Total Procedure Duration: 0 hours 39 minutes 54 seconds       [facility]

## 2016-11-13 ENCOUNTER — Encounter: Payer: Self-pay | Admitting: General Surgery

## 2016-11-14 LAB — SURGICAL PATHOLOGY

## 2016-11-18 ENCOUNTER — Telehealth: Payer: Self-pay | Admitting: *Deleted

## 2016-11-18 NOTE — Telephone Encounter (Signed)
-----   Message from Christene Lye, MD sent at 11/17/2016  9:28 AM EDT ----- Please inform - no  abnomal findings on the polyp

## 2016-11-18 NOTE — Telephone Encounter (Signed)
Notified patient as instructed, patient pleased. Discussed follow-up appointments, patient agrees  

## 2016-11-20 ENCOUNTER — Other Ambulatory Visit: Payer: Self-pay | Admitting: Family Medicine

## 2016-12-18 DIAGNOSIS — D485 Neoplasm of uncertain behavior of skin: Secondary | ICD-10-CM | POA: Diagnosis not present

## 2016-12-18 DIAGNOSIS — Z08 Encounter for follow-up examination after completed treatment for malignant neoplasm: Secondary | ICD-10-CM | POA: Diagnosis not present

## 2016-12-18 DIAGNOSIS — C44519 Basal cell carcinoma of skin of other part of trunk: Secondary | ICD-10-CM | POA: Diagnosis not present

## 2016-12-18 DIAGNOSIS — L570 Actinic keratosis: Secondary | ICD-10-CM | POA: Diagnosis not present

## 2016-12-18 DIAGNOSIS — Z85828 Personal history of other malignant neoplasm of skin: Secondary | ICD-10-CM | POA: Diagnosis not present

## 2016-12-18 DIAGNOSIS — X32XXXA Exposure to sunlight, initial encounter: Secondary | ICD-10-CM | POA: Diagnosis not present

## 2016-12-22 ENCOUNTER — Other Ambulatory Visit: Payer: Self-pay | Admitting: Physician Assistant

## 2016-12-22 DIAGNOSIS — E7800 Pure hypercholesterolemia, unspecified: Secondary | ICD-10-CM

## 2017-01-22 DIAGNOSIS — L905 Scar conditions and fibrosis of skin: Secondary | ICD-10-CM | POA: Diagnosis not present

## 2017-01-22 DIAGNOSIS — C44519 Basal cell carcinoma of skin of other part of trunk: Secondary | ICD-10-CM | POA: Diagnosis not present

## 2017-02-18 ENCOUNTER — Other Ambulatory Visit: Payer: PPO

## 2017-02-18 DIAGNOSIS — Z08 Encounter for follow-up examination after completed treatment for malignant neoplasm: Secondary | ICD-10-CM | POA: Diagnosis not present

## 2017-02-18 DIAGNOSIS — N529 Male erectile dysfunction, unspecified: Secondary | ICD-10-CM | POA: Diagnosis not present

## 2017-02-18 DIAGNOSIS — Z8546 Personal history of malignant neoplasm of prostate: Secondary | ICD-10-CM | POA: Diagnosis not present

## 2017-02-18 DIAGNOSIS — N5231 Erectile dysfunction following radical prostatectomy: Secondary | ICD-10-CM | POA: Diagnosis not present

## 2017-02-18 DIAGNOSIS — Z79899 Other long term (current) drug therapy: Secondary | ICD-10-CM | POA: Diagnosis not present

## 2017-02-18 DIAGNOSIS — Z6827 Body mass index (BMI) 27.0-27.9, adult: Secondary | ICD-10-CM | POA: Diagnosis not present

## 2017-02-20 ENCOUNTER — Ambulatory Visit: Payer: PPO | Admitting: Internal Medicine

## 2017-02-27 ENCOUNTER — Inpatient Hospital Stay: Payer: PPO | Attending: Internal Medicine

## 2017-02-27 DIAGNOSIS — Z8614 Personal history of Methicillin resistant Staphylococcus aureus infection: Secondary | ICD-10-CM | POA: Insufficient documentation

## 2017-02-27 DIAGNOSIS — Z8589 Personal history of malignant neoplasm of other organs and systems: Secondary | ICD-10-CM | POA: Insufficient documentation

## 2017-02-27 DIAGNOSIS — K760 Fatty (change of) liver, not elsewhere classified: Secondary | ICD-10-CM | POA: Insufficient documentation

## 2017-02-27 DIAGNOSIS — Z86010 Personal history of colonic polyps: Secondary | ICD-10-CM | POA: Insufficient documentation

## 2017-02-27 DIAGNOSIS — Z79899 Other long term (current) drug therapy: Secondary | ICD-10-CM | POA: Insufficient documentation

## 2017-02-27 DIAGNOSIS — Z7982 Long term (current) use of aspirin: Secondary | ICD-10-CM | POA: Diagnosis not present

## 2017-02-27 DIAGNOSIS — C181 Malignant neoplasm of appendix: Secondary | ICD-10-CM

## 2017-02-27 DIAGNOSIS — C61 Malignant neoplasm of prostate: Secondary | ICD-10-CM | POA: Insufficient documentation

## 2017-02-27 LAB — CBC WITH DIFFERENTIAL/PLATELET
Basophils Absolute: 0 10*3/uL (ref 0–0.1)
Basophils Relative: 0 %
EOS PCT: 3 %
Eosinophils Absolute: 0.2 10*3/uL (ref 0–0.7)
HCT: 41.2 % (ref 40.0–52.0)
Hemoglobin: 14.5 g/dL (ref 13.0–18.0)
LYMPHS ABS: 1.8 10*3/uL (ref 1.0–3.6)
LYMPHS PCT: 34 %
MCH: 32.1 pg (ref 26.0–34.0)
MCHC: 35.2 g/dL (ref 32.0–36.0)
MCV: 91.3 fL (ref 80.0–100.0)
Monocytes Absolute: 0.5 10*3/uL (ref 0.2–1.0)
Monocytes Relative: 9 %
NEUTROS ABS: 3 10*3/uL (ref 1.4–6.5)
NEUTROS PCT: 54 %
Platelets: 204 10*3/uL (ref 150–440)
RBC: 4.51 MIL/uL (ref 4.40–5.90)
RDW: 13.1 % (ref 11.5–14.5)
WBC: 5.5 10*3/uL (ref 3.8–10.6)

## 2017-02-27 LAB — COMPREHENSIVE METABOLIC PANEL
ALK PHOS: 29 U/L — AB (ref 38–126)
ALT: 43 U/L (ref 17–63)
AST: 39 U/L (ref 15–41)
Albumin: 4.3 g/dL (ref 3.5–5.0)
Anion gap: 7 (ref 5–15)
BUN: 20 mg/dL (ref 6–20)
CO2: 27 mmol/L (ref 22–32)
CREATININE: 1.02 mg/dL (ref 0.61–1.24)
Calcium: 9.1 mg/dL (ref 8.9–10.3)
Chloride: 102 mmol/L (ref 101–111)
GFR calc non Af Amer: >60 mL/min (ref >60)
Glucose, Bld: 102 mg/dL — ABNORMAL HIGH (ref 65–99)
Potassium: 4.5 mmol/L (ref 3.5–5.1)
Sodium: 136 mmol/L (ref 135–145)
Total Bilirubin: 1 mg/dL (ref 0.3–1.2)
Total Protein: 7 g/dL (ref 6.5–8.1)

## 2017-02-28 LAB — CEA: CEA1: 1.5 ng/mL (ref 0.0–4.7)

## 2017-03-02 ENCOUNTER — Inpatient Hospital Stay (HOSPITAL_BASED_OUTPATIENT_CLINIC_OR_DEPARTMENT_OTHER): Payer: PPO | Admitting: Internal Medicine

## 2017-03-02 VITALS — BP 107/71 | HR 62 | Temp 97.2°F | Wt 195.4 lb

## 2017-03-02 DIAGNOSIS — Z86010 Personal history of colonic polyps: Secondary | ICD-10-CM

## 2017-03-02 DIAGNOSIS — Z8589 Personal history of malignant neoplasm of other organs and systems: Secondary | ICD-10-CM

## 2017-03-02 DIAGNOSIS — Z7982 Long term (current) use of aspirin: Secondary | ICD-10-CM | POA: Diagnosis not present

## 2017-03-02 DIAGNOSIS — K760 Fatty (change of) liver, not elsewhere classified: Secondary | ICD-10-CM

## 2017-03-02 DIAGNOSIS — C61 Malignant neoplasm of prostate: Secondary | ICD-10-CM

## 2017-03-02 DIAGNOSIS — Z79899 Other long term (current) drug therapy: Secondary | ICD-10-CM

## 2017-03-02 DIAGNOSIS — Z8614 Personal history of Methicillin resistant Staphylococcus aureus infection: Secondary | ICD-10-CM

## 2017-03-02 DIAGNOSIS — C181 Malignant neoplasm of appendix: Secondary | ICD-10-CM

## 2017-03-02 NOTE — Progress Notes (Signed)
Patient is here today for a follow up. Patient reports no new concerns today.  

## 2017-03-02 NOTE — Assessment & Plan Note (Addendum)
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 Last CT scan negative for recurrence; discussed regarding fatty liver [C discussion below].  CEA normal. Labs normal.  # Prostate cancer-clinically no evidence of recurrence;  as the patient most recent PSA- 0.1[Dr.Stoiff].   # fatty liver- physically active/ discussed re: diet.   # Follow-up in 6 months with labs- few days prior.  # 25 minutes face-to-face with the patient discussing the above plan of care; more than 50% of time spent on prognosis/ natural history; counseling and coordination.

## 2017-03-02 NOTE — Progress Notes (Signed)
[place] OFFICE PROGRESS NOTE  Patient Care Team: [name]., MD as PCP - General (Family Medicine) [name]., MD (Family Medicine) Christene Lye, MD (General Surgery)  Cancer Staging No matching staging information was found for the patient.   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  Dr.Sankar] last CT March 2018- NED [discussed not doing further surveillance scans]  # colo [Dr.sankar April 2018- repeat colo in 5 years]  # Prostate cancer 2008 s/p Prostatectomy [Dr.Stoiff].      Malignant neoplasm of appendix [facility])     INTERVAL HISTORY:  Edward Mccoy 66 y.o.  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  In the interim patient underwent colonoscopy by Dr. Jamal Collin. Patient denies any abdominal pain constipation or distention. Appetite is good. No weight loss. Denies any bone pain.  REVIEW OF SYSTEMS:  A complete 10 point review of system is done which is negative except mentioned above/history of present illness.   PAST MEDICAL HISTORY :  Past Medical History:  Diagnosis Date  . Cancer [facility]) 2008   prostate  . Cancer of appendix ([place]) 11/15/14   high-grade appendiceal mucinous neoplasm  . Colon polyp   . MRSA infection 05/2015   Right Kneee    PAST SURGICAL HISTORY :   Past Surgical History:  Procedure Laterality Date  . APPENDECTOMY  11/15/14   patient report it was cancerous   . COLONOSCOPY  07-09-06   Dr Allen Norris  . COLONOSCOPY WITH PROPOFOL N/A 11/12/2016   Procedure: COLONOSCOPY WITH PROPOFOL;  Surgeon: Christene Lye, MD;  Location: [facility] ENDOSCOPY;  Service: Endoscopy;  Laterality: N/A;  . PROSTATE SURGERY  June 2008  . SHOULDER SURGERY  Nov 1999, Oct 2015    FAMILY HISTORY :   Family History  Problem Relation  Age of Onset  . Cancer Mother   . Diabetes Father   . Parkinson's disease Father     SOCIAL HISTORY:   Social History  Substance Use Topics  . Smoking status: Never Smoker  . Smokeless tobacco: Never Used  . Alcohol use 0.0 oz/week     Comment: occasionally    ALLERGIES:  has No Known Allergies.  MEDICATIONS:  Current Outpatient Prescriptions  Medication Sig Dispense Refill  . Ascorbic Acid (VITAMIN C) 1000 MG tablet Take 1,000 mg by mouth daily.    Marland Kitchen aspirin EC 81 MG tablet Take 81 mg by mouth daily.    . celecoxib (CELEBREX) 200 MG capsule TAKE 1 CAPSULE BY MOUTH DAILY AS NEEDED 30 capsule 11  . Multiple Vitamins-Minerals (MULTIVITAMIN ADULT PO) Take by mouth daily.    . Omega-3 Fatty Acids (FISH OIL) 1200 MG CAPS Take by mouth.    . simvastatin (ZOCOR) 10 MG tablet TAKE 1 TABLET BY MOUTH AT BEDTIME 90 tablet 3  . vitamin E 400 UNIT capsule Take 400 Units by mouth daily.     No current facility-administered medications for this visit.     PHYSICAL EXAMINATION: ECOG PERFORMANCE STATUS: 0 - Asymptomatic  BP 107/71 (BP Location: Left Arm, Patient Position: Sitting)   Pulse 62   Temp (!) 97.2 F (36.2 C) (Tympanic)   Wt 195 lb 6.4 oz (88.6 kg)   BMI 28.86 kg/m   Filed Weights   03/02/17 0920  Weight: 195 lb 6.4 oz (88.6 kg)  GENERAL: Well-nourished well-developed; Alert, no distress and comfortable.   Alone.  EYES: no pallor or icterus OROPHARYNX: no thrush or ulceration; good dentition  NECK: supple, no masses felt LYMPH:  no palpable lymphadenopathy in the cervical, axillary or inguinal regions LUNGS: clear to auscultation and  No wheeze or crackles HEART/CVS: regular rate & rhythm and no murmurs; No lower extremity edema ABDOMEN:abdomen soft, non-tender and normal bowel sounds Musculoskeletal:no cyanosis of digits and no clubbing  PSYCH: alert & oriented x 3 with fluent speech NEURO: no focal motor/sensory deficits SKIN:  no rashes or significant  lesions  LABORATORY DATA:  I have reviewed the data as listed    Component Value Date/Time   NA 136 02/27/2017 0805   NA 143 08/20/2015 1018   NA 139 11/13/2014 1553   K 4.5 02/27/2017 0805   K 3.9 11/13/2014 1553   CL 102 02/27/2017 0805   CL 104 11/13/2014 1553   CO2 27 02/27/2017 0805   CO2 28 11/13/2014 1553   GLUCOSE 102 (H) 02/27/2017 0805   GLUCOSE 93 11/13/2014 1553   BUN 20 02/27/2017 0805   BUN 21 08/20/2015 1018   BUN 18 11/13/2014 1553   CREATININE 1.02 02/27/2017 0805   CREATININE 1.02 11/13/2014 1553   CALCIUM 9.1 02/27/2017 0805   CALCIUM 9.1 11/13/2014 1553   PROT 7.0 02/27/2017 0805   PROT 6.7 08/20/2015 1018   PROT 7.3 11/13/2014 1553   ALBUMIN 4.3 02/27/2017 0805   ALBUMIN 4.5 08/20/2015 1018   ALBUMIN 4.6 11/13/2014 1553   AST 39 02/27/2017 0805   AST 30 11/13/2014 1553   ALT 43 02/27/2017 0805   ALT 34 11/13/2014 1553   ALKPHOS 29 (L) 02/27/2017 0805   ALKPHOS 39 11/13/2014 1553   BILITOT 1.0 02/27/2017 0805   BILITOT 0.4 08/20/2015 1018   BILITOT 0.9 11/13/2014 1553   GFRNONAA >60 02/27/2017 0805   GFRNONAA >60 11/13/2014 1553   GFRAA >60 02/27/2017 0805   GFRAA >60 11/13/2014 1553    No results found for: SPEP, UPEP  Lab Results  Component Value Date   WBC 5.5 02/27/2017   NEUTROABS 3.0 02/27/2017   HGB 14.5 02/27/2017   HCT 41.2 02/27/2017   MCV 91.3 02/27/2017   PLT 204 02/27/2017      Chemistry      Component Value Date/Time   NA 136 02/27/2017 0805   NA 143 08/20/2015 1018   NA 139 11/13/2014 1553   K 4.5 02/27/2017 0805   K 3.9 11/13/2014 1553   CL 102 02/27/2017 0805   CL 104 11/13/2014 1553   CO2 27 02/27/2017 0805   CO2 28 11/13/2014 1553   BUN 20 02/27/2017 0805   BUN 21 08/20/2015 1018   BUN 18 11/13/2014 1553   CREATININE 1.02 02/27/2017 0805   CREATININE 1.02 11/13/2014 1553   GLU 102 08/10/2014      Component Value Date/Time   CALCIUM 9.1 02/27/2017 0805   CALCIUM 9.1 11/13/2014 1553   ALKPHOS 29 (L)  02/27/2017 0805   ALKPHOS 39 11/13/2014 1553   AST 39 02/27/2017 0805   AST 30 11/13/2014 1553   ALT 43 02/27/2017 0805   ALT 34 11/13/2014 1553   BILITOT 1.0 02/27/2017 0805   BILITOT 0.4 08/20/2015 1018   BILITOT 0.9 11/13/2014 1553       RADIOGRAPHIC STUDIES: I have personally reviewed the radiological images as listed and agreed with the findings in the report. No results found.   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 stage I clinically no evidence of recurrence. Last CT scan negative for recurrence; discussed regarding fatty liver [C discussion below].  CEA normal. Labs normal.  # Prostate cancer-clinically no evidence of recurrence;  as the patient most recent PSA- 0.1[Dr.Stoiff].   # fatty liver- physically active/ discussed re: diet.   # Follow-up in 6 months with labs- few days prior.  # 25 minutes face-to-face with the patient discussing the above plan of care; more than 50% of time spent on prognosis/ natural history; counseling and coordination.    Orders Placed This Encounter  Procedures  . CBC with Differential/Platelet    Standing Status:   Future    Standing Expiration Date:   03/02/2018  . CEA    Standing Status:   Future    Standing Expiration Date:   03/02/2018  . Comprehensive metabolic panel    Standing Status:   Future    Standing Expiration Date:   03/02/2018   All questions were answered. The patient knows to call the clinic with any problems, questions or concerns.      [name], MD 03/02/2017 9:54 AM

## 2017-04-30 DIAGNOSIS — D2272 Melanocytic nevi of left lower limb, including hip: Secondary | ICD-10-CM | POA: Diagnosis not present

## 2017-04-30 DIAGNOSIS — D485 Neoplasm of uncertain behavior of skin: Secondary | ICD-10-CM | POA: Diagnosis not present

## 2017-04-30 DIAGNOSIS — C44519 Basal cell carcinoma of skin of other part of trunk: Secondary | ICD-10-CM | POA: Diagnosis not present

## 2017-04-30 DIAGNOSIS — D225 Melanocytic nevi of trunk: Secondary | ICD-10-CM | POA: Diagnosis not present

## 2017-04-30 DIAGNOSIS — Z85828 Personal history of other malignant neoplasm of skin: Secondary | ICD-10-CM | POA: Diagnosis not present

## 2017-04-30 DIAGNOSIS — D2262 Melanocytic nevi of left upper limb, including shoulder: Secondary | ICD-10-CM | POA: Diagnosis not present

## 2017-05-13 DIAGNOSIS — C44519 Basal cell carcinoma of skin of other part of trunk: Secondary | ICD-10-CM | POA: Diagnosis not present

## 2017-07-31 ENCOUNTER — Telehealth: Payer: Self-pay

## 2017-07-31 NOTE — Telephone Encounter (Signed)
[facility] sent a refill request for trimix. Per Dr. [name] trimix was refilled.

## 2017-08-11 ENCOUNTER — Encounter: Payer: PPO | Admitting: Family Medicine

## 2017-08-24 ENCOUNTER — Inpatient Hospital Stay: Payer: PPO | Attending: Internal Medicine

## 2017-08-24 DIAGNOSIS — Z79899 Other long term (current) drug therapy: Secondary | ICD-10-CM | POA: Diagnosis not present

## 2017-08-24 DIAGNOSIS — Z7982 Long term (current) use of aspirin: Secondary | ICD-10-CM | POA: Diagnosis not present

## 2017-08-24 DIAGNOSIS — Z9049 Acquired absence of other specified parts of digestive tract: Secondary | ICD-10-CM | POA: Diagnosis not present

## 2017-08-24 DIAGNOSIS — Z8589 Personal history of malignant neoplasm of other organs and systems: Secondary | ICD-10-CM | POA: Diagnosis not present

## 2017-08-24 DIAGNOSIS — Z86010 Personal history of colonic polyps: Secondary | ICD-10-CM | POA: Insufficient documentation

## 2017-08-24 DIAGNOSIS — C181 Malignant neoplasm of appendix: Secondary | ICD-10-CM

## 2017-08-24 DIAGNOSIS — Z8546 Personal history of malignant neoplasm of prostate: Secondary | ICD-10-CM | POA: Insufficient documentation

## 2017-08-24 DIAGNOSIS — Z801 Family history of malignant neoplasm of trachea, bronchus and lung: Secondary | ICD-10-CM | POA: Diagnosis not present

## 2017-08-24 DIAGNOSIS — Z8614 Personal history of Methicillin resistant Staphylococcus aureus infection: Secondary | ICD-10-CM | POA: Insufficient documentation

## 2017-08-24 LAB — CBC WITH DIFFERENTIAL/PLATELET
Basophils Absolute: 0 10*3/uL (ref 0–0.1)
Basophils Relative: 1 %
EOS PCT: 9 %
Eosinophils Absolute: 0.4 10*3/uL (ref 0–0.7)
HCT: 41.6 % (ref 40.0–52.0)
Hemoglobin: 14.2 g/dL (ref 13.0–18.0)
LYMPHS ABS: 1.3 10*3/uL (ref 1.0–3.6)
Lymphocytes Relative: 28 %
MCH: 32.1 pg (ref 26.0–34.0)
MCHC: 34.2 g/dL (ref 32.0–36.0)
MCV: 93.9 fL (ref 80.0–100.0)
MONO ABS: 0.4 10*3/uL (ref 0.2–1.0)
Monocytes Relative: 10 %
Neutro Abs: 2.4 10*3/uL (ref 1.4–6.5)
Neutrophils Relative %: 52 %
PLATELETS: 188 10*3/uL (ref 150–440)
RBC: 4.43 MIL/uL (ref 4.40–5.90)
RDW: 13.2 % (ref 11.5–14.5)
WBC: 4.5 10*3/uL (ref 3.8–10.6)

## 2017-08-24 LAB — COMPREHENSIVE METABOLIC PANEL
ALBUMIN: 4.1 g/dL (ref 3.5–5.0)
ALT: 49 U/L (ref 17–63)
AST: 40 U/L (ref 15–41)
Alkaline Phosphatase: 32 U/L — ABNORMAL LOW (ref 38–126)
Anion gap: 7 (ref 5–15)
BILIRUBIN TOTAL: 0.8 mg/dL (ref 0.3–1.2)
BUN: 29 mg/dL — AB (ref 6–20)
CHLORIDE: 106 mmol/L (ref 101–111)
CO2: 28 mmol/L (ref 22–32)
CREATININE: 1.06 mg/dL (ref 0.61–1.24)
Calcium: 8.8 mg/dL — ABNORMAL LOW (ref 8.9–10.3)
GFR calc Af Amer: >60 mL/min (ref >60)
GFR calc non Af Amer: >60 mL/min (ref >60)
GLUCOSE: 111 mg/dL — AB (ref 65–99)
POTASSIUM: 4.7 mmol/L (ref 3.5–5.1)
Sodium: 141 mmol/L (ref 135–145)
TOTAL PROTEIN: 6.8 g/dL (ref 6.5–8.1)

## 2017-08-25 LAB — CEA: CEA: 1 ng/mL (ref 0.0–4.7)

## 2017-08-31 ENCOUNTER — Inpatient Hospital Stay (HOSPITAL_BASED_OUTPATIENT_CLINIC_OR_DEPARTMENT_OTHER): Payer: PPO | Admitting: Internal Medicine

## 2017-08-31 ENCOUNTER — Encounter: Payer: Self-pay | Admitting: Internal Medicine

## 2017-08-31 VITALS — BP 115/72 | HR 59 | Temp 98.4°F | Wt 196.8 lb

## 2017-08-31 DIAGNOSIS — Z8546 Personal history of malignant neoplasm of prostate: Secondary | ICD-10-CM | POA: Diagnosis not present

## 2017-08-31 DIAGNOSIS — C181 Malignant neoplasm of appendix: Secondary | ICD-10-CM

## 2017-08-31 DIAGNOSIS — Z801 Family history of malignant neoplasm of trachea, bronchus and lung: Secondary | ICD-10-CM

## 2017-08-31 DIAGNOSIS — Z86010 Personal history of colonic polyps: Secondary | ICD-10-CM

## 2017-08-31 DIAGNOSIS — Z9049 Acquired absence of other specified parts of digestive tract: Secondary | ICD-10-CM

## 2017-08-31 DIAGNOSIS — Z8589 Personal history of malignant neoplasm of other organs and systems: Secondary | ICD-10-CM

## 2017-08-31 DIAGNOSIS — Z8614 Personal history of Methicillin resistant Staphylococcus aureus infection: Secondary | ICD-10-CM | POA: Diagnosis not present

## 2017-08-31 DIAGNOSIS — Z79899 Other long term (current) drug therapy: Secondary | ICD-10-CM

## 2017-08-31 DIAGNOSIS — Z7982 Long term (current) use of aspirin: Secondary | ICD-10-CM | POA: Diagnosis not present

## 2017-08-31 NOTE — Progress Notes (Signed)
[place] OFFICE PROGRESS NOTE  Patient Care Team: [name]., MD as PCP - General (Family Medicine) [name]., MD (Family Medicine) Christene Lye, MD (General Surgery)  Cancer Staging No matching staging information was found for the patient.   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  Dr.Sankar] last CT March 2018- NED [discussed not doing further surveillance scans]  # colo [Dr.sankar April 2018- repeat colo in 5 years]  # Prostate cancer 2008 s/p Prostatectomy [Dr.Stoiff].      Malignant neoplasm of appendix [facility])     INTERVAL HISTORY:  Edward Mccoy 67 y.o.  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  Patient denies any abdominal pain constipation or distention. Appetite is good. No weight loss. Denies any bone pain.  No nausea no vomiting.  REVIEW OF SYSTEMS:  A complete 10 point review of system is done which is negative except mentioned above/history of present illness.   PAST MEDICAL HISTORY :  Past Medical History:  Diagnosis Date  . Cancer [facility]) 2008   prostate  . Cancer of appendix ([place]) 11/15/14   high-grade appendiceal mucinous neoplasm  . Colon polyp   . MRSA infection 05/2015   Right Kneee    PAST SURGICAL HISTORY :   Past Surgical History:  Procedure Laterality Date  . APPENDECTOMY  11/15/14   patient report it was cancerous   . COLONOSCOPY  07-09-06   Dr Allen Norris  . COLONOSCOPY WITH PROPOFOL N/A 11/12/2016   Procedure: COLONOSCOPY WITH PROPOFOL;  Surgeon: Christene Lye, MD;  Location: [facility] ENDOSCOPY;  Service: Endoscopy;  Laterality: N/A;  . PROSTATE SURGERY  June 2008  . SHOULDER SURGERY  Nov 1999, Oct 2015    FAMILY HISTORY :   Family History  Problem Relation Age of Onset  . Cancer Mother   .  Diabetes Father   . Parkinson's disease Father     SOCIAL HISTORY:   Social History   Tobacco Use  . Smoking status: Never Smoker  . Smokeless tobacco: Never Used  Substance Use Topics  . Alcohol use: Yes    Alcohol/week: 0.0 oz    Comment: occasionally  . Drug use: No    ALLERGIES:  has No Known Allergies.  MEDICATIONS:  Current Outpatient Medications  Medication Sig Dispense Refill  . Ascorbic Acid (VITAMIN C) 1000 MG tablet Take 1,000 mg by mouth daily.    Marland Kitchen aspirin EC 81 MG tablet Take 81 mg by mouth daily.    . celecoxib (CELEBREX) 200 MG capsule TAKE 1 CAPSULE BY MOUTH DAILY AS NEEDED 30 capsule 11  . Multiple Vitamins-Minerals (MULTIVITAMIN ADULT PO) Take by mouth daily.    . Omega-3 Fatty Acids (FISH OIL) 1200 MG CAPS Take by mouth.    . simvastatin (ZOCOR) 10 MG tablet TAKE 1 TABLET BY MOUTH AT BEDTIME 90 tablet 3  . vitamin E 400 UNIT capsule Take 400 Units by mouth daily.     No current facility-administered medications for this visit.     PHYSICAL EXAMINATION: ECOG PERFORMANCE STATUS: 0 - Asymptomatic  BP 115/72 (BP Location: Left Arm, Patient Position: Sitting)   Pulse (!) 59   Temp 98.4 F (36.9 C) (Oral)   Wt 196 lb 12.8 oz (89.3 kg)   SpO2 100%   BMI 29.06 kg/m   [employer]  08/31/17 1514  Weight: 196 lb 12.8 oz (89.3 kg)    GENERAL: Well-nourished well-developed; Alert, no distress and comfortable.   Alone.  EYES: no pallor or icterus OROPHARYNX: no thrush or ulceration; good dentition  NECK: supple, no masses felt LYMPH:  no palpable lymphadenopathy in the cervical, axillary or inguinal regions LUNGS: clear to auscultation and  No wheeze or crackles HEART/CVS: regular rate & rhythm and no murmurs; No lower extremity edema ABDOMEN:abdomen soft, non-tender and normal bowel sounds Musculoskeletal:no cyanosis of digits and no clubbing  PSYCH: alert & oriented x 3 with fluent speech NEURO: no focal motor/sensory deficits SKIN:  no rashes  or significant lesions  LABORATORY DATA:  I have reviewed the data as listed    Component Value Date/Time   NA 141 08/24/2017 0813   NA 143 08/20/2015 1018   NA 139 11/13/2014 1553   K 4.7 08/24/2017 0813   K 3.9 11/13/2014 1553   CL 106 08/24/2017 0813   CL 104 11/13/2014 1553   CO2 28 08/24/2017 0813   CO2 28 11/13/2014 1553   GLUCOSE 111 (H) 08/24/2017 0813   GLUCOSE 93 11/13/2014 1553   BUN 29 (H) 08/24/2017 0813   BUN 21 08/20/2015 1018   BUN 18 11/13/2014 1553   CREATININE 1.06 08/24/2017 0813   CREATININE 1.02 11/13/2014 1553   CALCIUM 8.8 (L) 08/24/2017 0813   CALCIUM 9.1 11/13/2014 1553   PROT 6.8 08/24/2017 0813   PROT 6.7 08/20/2015 1018   PROT 7.3 11/13/2014 1553   ALBUMIN 4.1 08/24/2017 0813   ALBUMIN 4.5 08/20/2015 1018   ALBUMIN 4.6 11/13/2014 1553   AST 40 08/24/2017 0813   AST 30 11/13/2014 1553   ALT 49 08/24/2017 0813   ALT 34 11/13/2014 1553   ALKPHOS 32 (L) 08/24/2017 0813   ALKPHOS 39 11/13/2014 1553   BILITOT 0.8 08/24/2017 0813   BILITOT 0.4 08/20/2015 1018   BILITOT 0.9 11/13/2014 1553   GFRNONAA >60 08/24/2017 0813   GFRNONAA >60 11/13/2014 1553   GFRAA >60 08/24/2017 0813   GFRAA >60 11/13/2014 1553    No results found for: SPEP, UPEP  Lab Results  Component Value Date   WBC 4.5 08/24/2017   NEUTROABS 2.4 08/24/2017   HGB 14.2 08/24/2017   HCT 41.6 08/24/2017   MCV 93.9 08/24/2017   PLT 188 08/24/2017      Chemistry      Component Value Date/Time   NA 141 08/24/2017 0813   NA 143 08/20/2015 1018   NA 139 11/13/2014 1553   K 4.7 08/24/2017 0813   K 3.9 11/13/2014 1553   CL 106 08/24/2017 0813   CL 104 11/13/2014 1553   CO2 28 08/24/2017 0813   CO2 28 11/13/2014 1553   BUN 29 (H) 08/24/2017 0813   BUN 21 08/20/2015 1018   BUN 18 11/13/2014 1553   CREATININE 1.06 08/24/2017 0813   CREATININE 1.02 11/13/2014 1553   GLU 102 08/10/2014      Component Value Date/Time   CALCIUM 8.8 (L) 08/24/2017 0813   CALCIUM 9.1  11/13/2014 1553   ALKPHOS 32 (L) 08/24/2017 0813   ALKPHOS 39 11/13/2014 1553   AST 40 08/24/2017 0813   AST 30 11/13/2014 1553   ALT 49 08/24/2017 0813   ALT 34 11/13/2014 1553   BILITOT 0.8 08/24/2017 0813   BILITOT 0.4 08/20/2015 1018   BILITOT 0.9 11/13/2014 1553       RADIOGRAPHIC STUDIES: I have personally reviewed the radiological images as  listed and agreed with the findings in the report. No results found.   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 stage I clinically no evidence of recurrence.   # clinically NO evidence of recurrence.  CEA normal. Labs normal.  # Prostate cancer-clinically no evidence of recurrence;  as the patient most recent PSA- 0.1[Dr.Stoiff].   # Follow-up in 6 months with labs- few days prior.    Orders Placed This Encounter  Procedures  . CBC    Standing Status:   Future    Standing Expiration Date:   08/31/2018  . Comprehensive metabolic panel    Standing Status:   Future    Standing Expiration Date:   08/31/2018  . CEA    Standing Status:   Future    Standing Expiration Date:   08/31/2018   All questions were answered. The patient knows to call the clinic with any problems, questions or concerns.      [name], MD 08/31/2017 3:44 PM

## 2017-08-31 NOTE — Assessment & Plan Note (Addendum)
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   # clinically NO evidence of recurrence.  CEA normal. Labs normal.  # Prostate cancer-clinically no evidence of recurrence;  as the patient most recent PSA- 0.1[Dr.Stoiff].   # Follow-up in 6 months with labs- few days prior.

## 2017-09-07 ENCOUNTER — Ambulatory Visit (INDEPENDENT_AMBULATORY_CARE_PROVIDER_SITE_OTHER): Payer: PPO

## 2017-09-07 VITALS — BP 116/70 | HR 60 | Temp 97.9°F | Ht 69.0 in | Wt 200.0 lb

## 2017-09-07 DIAGNOSIS — Z0000 Encounter for general adult medical examination without abnormal findings: Secondary | ICD-10-CM

## 2017-09-07 DIAGNOSIS — Z23 Encounter for immunization: Secondary | ICD-10-CM

## 2017-09-07 NOTE — Patient Instructions (Signed)
Edward Mccoy , Thank you for taking time to come for your Medicare Wellness Visit. I appreciate your ongoing commitment to your health goals. Please review the following plan we discussed and let me know if I can assist you in the future.   Screening recommendations/referrals: Colonoscopy: Up to date Recommended yearly ophthalmology/optometry visit for glaucoma screening and checkup Recommended yearly dental visit for hygiene and checkup  Vaccinations: Influenza vaccine: Up to date Pneumococcal vaccine: Completed series today Tdap vaccine: Up to date Shingles vaccine: Zostavax completed in 2015. Pt to look into new Shingrix before receiving.    Advanced directives: Already on file.  Conditions/risks identified: Recommend increasing water intake to 6-8 glasses a day.   Next appointment: 09/08/17 @ 10:30 AM  Preventive Care 65 Years and Older, Male Preventive care refers to lifestyle choices and visits with your health care provider that can promote health and wellness. What does preventive care include?  A yearly physical exam. This is also called an annual well check.  Dental exams once or twice a year.  Routine eye exams. Ask your health care provider how often you should have your eyes checked.  Personal lifestyle choices, including:  Daily care of your teeth and gums.  Regular physical activity.  Eating a healthy diet.  Avoiding tobacco and drug use.  Limiting alcohol use.  Practicing safe sex.  Taking low doses of aspirin every day.  Taking vitamin and mineral supplements as recommended by your health care provider. What happens during an annual well check? The services and screenings done by your health care provider during your annual well check will depend on your age, overall health, lifestyle risk factors, and family history of disease. Counseling  Your health care provider may ask you questions about your:  Alcohol use.  Tobacco use.  Drug  use.  Emotional well-being.  Home and relationship well-being.  Sexual activity.  Eating habits.  History of falls.  Memory and ability to understand (cognition).  Work and work Statistician. Screening  You may have the following tests or measurements:  Height, weight, and BMI.  Blood pressure.  Lipid and cholesterol levels. These may be checked every 5 years, or more frequently if you are over 74 years old.  Skin check.  Lung cancer screening. You may have this screening every year starting at age 56 if you have a 30-pack-year history of smoking and currently smoke or have quit within the past 15 years.  Fecal occult blood test (FOBT) of the stool. You may have this test every year starting at age 58.  Flexible sigmoidoscopy or colonoscopy. You may have a sigmoidoscopy every 5 years or a colonoscopy every 10 years starting at age 1.  Prostate cancer screening. Recommendations will vary depending on your family history and other risks.  Hepatitis C blood test.  Hepatitis B blood test.  Sexually transmitted disease (STD) testing.  Diabetes screening. This is done by checking your blood sugar (glucose) after you have not eaten for a while (fasting). You may have this done every 1-3 years.  Abdominal aortic aneurysm (AAA) screening. You may need this if you are a current or former smoker.  Osteoporosis. You may be screened starting at age 11 if you are at high risk. Talk with your health care provider about your test results, treatment options, and if necessary, the need for more tests. Vaccines  Your health care provider may recommend certain vaccines, such as:  Influenza vaccine. This is recommended every year.  Tetanus, diphtheria,  and acellular pertussis (Tdap, Td) vaccine. You may need a Td booster every 10 years.  Zoster vaccine. You may need this after age 81.  Pneumococcal 13-valent conjugate (PCV13) vaccine. One dose is recommended after age  29.  Pneumococcal polysaccharide (PPSV23) vaccine. One dose is recommended after age 35. Talk to your health care provider about which screenings and vaccines you need and how often you need them. This information is not intended to replace advice given to you by your health care provider. Make sure you discuss any questions you have with your health care provider. Document Released: 08/17/2015 Document Revised: 04/09/2016 Document Reviewed: 05/22/2015 Elsevier Interactive Patient Education  2017 [employer] Prevention in the Home Falls can cause injuries. They can happen to people of all ages. There are many things you can do to make your home safe and to help prevent falls. What can I do on the outside of my home?  Regularly fix the edges of walkways and driveways and fix any cracks.  Remove anything that might make you trip as you walk through a door, such as a raised step or threshold.  Trim any bushes or trees on the path to your home.  Use bright outdoor lighting.  Clear any walking paths of anything that might make someone trip, such as rocks or tools.  Regularly check to see if handrails are loose or broken. Make sure that both sides of any steps have handrails.  Any raised decks and porches should have guardrails on the edges.  Have any leaves, snow, or ice cleared regularly.  Use sand or salt on walking paths during winter.  Clean up any spills in your garage right away. This includes oil or grease spills. What can I do in the bathroom?  Use night lights.  Install grab bars by the toilet and in the tub and shower. Do not use towel bars as grab bars.  Use non-skid mats or decals in the tub or shower.  If you need to sit down in the shower, use a plastic, non-slip stool.  Keep the floor dry. Clean up any water that spills on the floor as soon as it happens.  Remove soap buildup in the tub or shower regularly.  Attach bath mats securely with double-sided  non-slip rug tape.  Do not have throw rugs and other things on the floor that can make you trip. What can I do in the bedroom?  Use night lights.  Make sure that you have a light by your bed that is easy to reach.  Do not use any sheets or blankets that are too big for your bed. They should not hang down onto the floor.  Have a firm chair that has side arms. You can use this for support while you get dressed.  Do not have throw rugs and other things on the floor that can make you trip. What can I do in the kitchen?  Clean up any spills right away.  Avoid walking on wet floors.  Keep items that you use a lot in easy-to-reach places.  If you need to reach something above you, use a strong step stool that has a grab bar.  Keep electrical cords out of the way.  Do not use floor polish or wax that makes floors slippery. If you must use wax, use non-skid floor wax.  Do not have throw rugs and other things on the floor that can make you trip. What can I do with my  stairs?  Do not leave any items on the stairs.  Make sure that there are handrails on both sides of the stairs and use them. Fix handrails that are broken or loose. Make sure that handrails are as long as the stairways.  Check any carpeting to make sure that it is firmly attached to the stairs. Fix any carpet that is loose or worn.  Avoid having throw rugs at the top or bottom of the stairs. If you do have throw rugs, attach them to the floor with carpet tape.  Make sure that you have a light switch at the top of the stairs and the bottom of the stairs. If you do not have them, ask someone to add them for you. What else can I do to help prevent falls?  Wear shoes that:  Do not have high heels.  Have rubber bottoms.  Are comfortable and fit you well.  Are closed at the toe. Do not wear sandals.  If you use a stepladder:  Make sure that it is fully opened. Do not climb a closed stepladder.  Make sure that both  sides of the stepladder are locked into place.  Ask someone to hold it for you, if possible.  Clearly mark and make sure that you can see:  Any grab bars or handrails.  First and last steps.  Where the edge of each step is.  Use tools that help you move around (mobility aids) if they are needed. These include:  Canes.  Walkers.  Scooters.  Crutches.  Turn on the lights when you go into a dark area. Replace any light bulbs as soon as they burn out.  Set up your furniture so you have a clear path. Avoid moving your furniture around.  If any of your floors are uneven, fix them.  If there are any pets around you, be aware of where they are.  Review your medicines with your doctor. Some medicines can make you feel dizzy. This can increase your chance of falling. Ask your doctor what other things that you can do to help prevent falls. This information is not intended to replace advice given to you by your health care provider. Make sure you discuss any questions you have with your health care provider. Document Released: 05/17/2009 Document Revised: 12/27/2015 Document Reviewed: 08/25/2014 Elsevier Interactive Patient Education  2017 [employer].

## 2017-09-07 NOTE — Progress Notes (Signed)
Subjective:   Edward Mccoy is a 67 y.o. male who presents for Medicare Annual/Subsequent preventive examination.  Review of Systems:  N/A  Cardiac Risk Factors include: advanced age (>66men, >51 women);male gender;dyslipidemia     Objective:    Vitals: BP 116/70 (BP Location: Left Arm)   Pulse 60   Temp 97.9 F (36.6 C) (Oral)   Ht 5\' 9"  (1.753 m)   Wt 200 lb (90.7 kg)   BMI 29.53 kg/m   Body mass index is 29.53 kg/m.  Advanced Directives 09/07/2017 08/31/2017 03/02/2017 11/12/2016 08/22/2016 08/14/2016 02/14/2016  Does Patient Have a Medical Advance Directive? Yes Yes Yes Yes Yes No No;Yes  Type of Advance Directive - Living will;Healthcare Power of [place];Living will Out of facility DNR (pink MOST or yellow form) [place];Living will - [place];Living will  Does patient want to make changes to medical advance directive? - No - Patient declined - - - - -  Copy of [place] in Chart? Yes No - copy requested - - No - copy requested - No - copy requested    Tobacco Social History   Tobacco Use  Smoking Status Never Smoker  Smokeless Tobacco Never Used     Counseling given: Not Answered   Clinical Intake:  Pre-visit preparation completed: Yes  Pain : No/denies pain Pain Score: 0-No pain     Nutritional Status: BMI 25 -29 Overweight Nutritional Risks: None Diabetes: No  How often do you need to have someone help you when you read instructions, pamphlets, or other written materials from your doctor or pharmacy?: 1 - Never  Interpreter Needed?: No  Information entered by :: [facility], LPN  Past Medical History:  Diagnosis Date  . Cancer [facility]) 2008   prostate  . 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 Colon polyp   . MRSA infection 05/2015   Right Kneee   Past Surgical History:  Procedure Laterality Date  . APPENDECTOMY  11/15/14   patient report it was cancerous   . COLONOSCOPY  07-09-06   Dr Allen Norris  . COLONOSCOPY WITH PROPOFOL N/A 11/12/2016   Procedure: COLONOSCOPY WITH PROPOFOL;  Surgeon: Christene Lye, MD;  Location: [facility] ENDOSCOPY;  Service: Endoscopy;  Laterality: N/A;  . PROSTATE SURGERY  June 2008  . SHOULDER SURGERY  Nov 1999, Oct 2015   Family History  Problem Relation Age of Onset  . Cancer Mother   . Diabetes Father   . Parkinson's disease Father    Social History   Socioeconomic History  . Marital status: Married    Spouse name: None  . Number of children: 1  . Years of education: None  . Highest education level: Bachelor's degree (e.g., BA, AB, BS)  Social Needs  . Financial resource strain: Not hard at all  . Food insecurity - worry: Never true  . Food insecurity - inability: Never true  . Transportation needs - medical: No  . Transportation needs - non-medical: No  Occupational History  . 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 Smoking status: Never Smoker  . Smokeless tobacco: Never Used  Substance and Sexual Activity  . Alcohol use: Yes    Alcohol/week: 0.0 oz    Comment: occasionally- beer  . Drug use: No  . Sexual activity: None  Other Topics Concern  . None  Social History Narrative  . None    Outpatient Encounter Medications  as of 09/07/2017  Medication Sig  . Ascorbic Acid (VITAMIN C) 1000 MG tablet Take 1,000 mg by mouth daily.  Marland Kitchen aspirin EC 81 MG tablet Take 81 mg by mouth daily.  . celecoxib (CELEBREX) 200 MG capsule TAKE 1 CAPSULE BY MOUTH DAILY AS NEEDED  . FLUZONE HIGH-DOSE 0.5 ML injection   . Multiple Vitamins-Minerals (MULTIVITAMIN ADULT PO) Take by mouth daily.  . Omega-3 Fatty Acids (FISH OIL) 1200 MG CAPS Take by mouth daily.   . sildenafil (REVATIO) 20 MG tablet Take 20 mg by mouth daily.  . simvastatin (ZOCOR) 10 MG tablet TAKE 1 TABLET BY MOUTH AT BEDTIME  . vitamin E 400 UNIT capsule Take 400 Units by mouth daily.   No facility-administered  encounter medications on file as of 09/07/2017.     Activities of Daily Living In your present state of health, do you have any difficulty performing the following activities: 09/07/2017  Hearing? N  Vision? N  Difficulty concentrating or making decisions? N  Walking or climbing stairs? N  Dressing or bathing? N  Doing errands, shopping? N  Preparing Food and eating ? N  Using the Toilet? N  In the past six months, have you accidently leaked urine? N  Do you have problems with loss of bowel control? N  Managing your Medications? N  Managing your Finances? N  Housekeeping or managing your Housekeeping? N  Some recent data might be hidden    Patient Care Team: [name]., MD as PCP - General (Family Medicine) [name], MD as Consulting Physician (Internal Medicine) [name], MD as Consulting Physician (Urology)   Assessment:   This is a routine wellness examination for Joahan.  Exercise Activities and Dietary recommendations Current Exercise Habits: Home exercise routine, Type of exercise: Other - see comments;walking(run or rides bike), Time (Minutes): 30(to 1 hour), Frequency (Times/Week): 5, Weekly Exercise (Minutes/Week): 150, Intensity: Mild, Exercise limited by: None identified  Goals    . DIET - INCREASE WATER INTAKE     Recommend increasing water intake to 6-8 glasses a day.        Fall Risk Fall Risk  09/07/2017 08/14/2016 08/14/2015  Falls in the past year? No No No   Is the patient's home free of loose throw rugs in walkways, pet beds, electrical cords, etc?   yes      Grab bars in the bathroom? no      Handrails on the stairs?   no      Adequate lighting?   yes  Timed Get Up and Go Performed: N/A  Depression Screen PHQ 2/9 Scores 09/07/2017 09/07/2017 08/14/2016 08/14/2015  PHQ - 2 Score 0 0 0 0  PHQ- 9 Score 0 - - -    Cognitive Function: Pt declined screening today.      6CIT Screen 08/14/2016  What Year? 0 points  What month? 0  points  What time? 0 points  Count back from 20 0 points  Months in reverse 0 points  Repeat phrase 2 points  Total Score 2    Immunization History  Administered Date(s) Administered  . Influenza, High Dose Seasonal PF 08/14/2016  . Influenza,inj,Quad PF,6+ Mos 08/09/2013, 08/14/2015  . Pneumococcal Conjugate-13 08/14/2015  . Pneumococcal Polysaccharide-23 09/07/2017  . Td 01/03/2005  . Tdap 07/24/2011  . Zoster 08/09/2013    Qualifies for Shingles Vaccine? Due for Shingles vaccine. Declined my offer to administer today. Education has been provided regarding the importance of this vaccine.  Pt has been advised to call her insurance company to determine her out of pocket expense. Advised she may also receive this vaccine at her local pharmacy or Health Dept. Verbalized acceptance and understanding.  Screening Tests Health Maintenance  Topic Date Due  . TETANUS/TDAP  07/23/2021  . COLONOSCOPY  11/13/2026  . INFLUENZA VACCINE  Completed  . Hepatitis C Screening  Completed  . PNA vac Low Risk Adult  Completed   Cancer Screenings: Lung: Low Dose CT Chest recommended if Age 62-80 years, 30 pack-year currently smoking OR have quit w/in 15years. Patient does not qualify. Colorectal: Up to date  Additional Screenings:  Hepatitis B/HIV/Syphillis: Pt declines today.  Hepatitis C Screening: Up to date    Plan:  I have personally reviewed and addressed the Medicare Annual Wellness questionnaire and have noted the following in the patient's chart:  A. Medical and social history B. Use of alcohol, tobacco or illicit drugs  C. Current medications and supplements D. Functional ability and status E.  Nutritional status F.  Physical activity G. Advance directives H. List of other physicians I.  Hospitalizations, surgeries, and ER visits in previous 12 months J.  [place] such as hearing and vision if needed, cognitive and depression L. Referrals and appointments -  none  In addition, I have reviewed and discussed with patient certain preventive protocols, quality metrics, and best practice recommendations. A written personalized care plan for preventive services as well as general preventive health recommendations were provided to patient.  See attached scanned questionnaire for additional information.   Signed,  Fabio Neighbors, LPN Nurse Health Advisor   Nurse Recommendations: None.

## 2017-09-08 ENCOUNTER — Encounter: Payer: Self-pay | Admitting: Family Medicine

## 2017-09-08 ENCOUNTER — Ambulatory Visit (INDEPENDENT_AMBULATORY_CARE_PROVIDER_SITE_OTHER): Payer: PPO | Admitting: Family Medicine

## 2017-09-08 VITALS — BP 112/76 | HR 60 | Temp 98.2°F | Resp 16 | Ht 69.0 in | Wt 198.0 lb

## 2017-09-08 DIAGNOSIS — E785 Hyperlipidemia, unspecified: Secondary | ICD-10-CM | POA: Diagnosis not present

## 2017-09-08 DIAGNOSIS — Z8546 Personal history of malignant neoplasm of prostate: Secondary | ICD-10-CM

## 2017-09-08 DIAGNOSIS — Z0000 Encounter for general adult medical examination without abnormal findings: Secondary | ICD-10-CM

## 2017-09-08 DIAGNOSIS — R7303 Prediabetes: Secondary | ICD-10-CM | POA: Diagnosis not present

## 2017-09-08 NOTE — Progress Notes (Signed)
Patient: Edward Mccoy, Male    DOB: [birth year], 67 y.o.   MRN: [id number] Visit Date: 09/08/2017  Today's Provider: [name], MD   Chief Complaint  Patient presents with  . Annual Exam   Subjective:     Complete Physical [name] is a 67 y.o. male. He had his AWV with the nurse health advisor yesterday. He feels well. He reports exercising 5 days per week. Runs or rides stationary bicycle for 30-60 minutes. He reports he is sleeping well.  He is fasting for labs.  Last Colonoscopy- 11/12/2016- diverticulosis. No abnormal pathology findings on polyp. Repeat 5 years per Dr. Jamal Collin. -----------------------------------------------------------   Review of Systems  Constitutional: Negative.   HENT: Negative.   Eyes: Negative.   Respiratory: Negative.   Cardiovascular: Negative.   Gastrointestinal: Negative.   Endocrine: Negative.   Genitourinary: Negative.   Musculoskeletal: Negative.   Skin: Negative.   Allergic/Immunologic: Negative.   Neurological: Negative.   Hematological: Negative.   Psychiatric/Behavioral: Negative.     Social History   Socioeconomic History  . Marital status: Married    Spouse name: [name]  . Number of children: 1  . Years of education: 56  . Highest education level: Bachelor's degree (e.g., BA, AB, BS)  Social Needs  . Financial resource strain: Not hard at all  . Food insecurity - worry: Never true  . Food insecurity - inability: Never true  . Transportation needs - medical: No  . Transportation needs - non-medical: No  Occupational History  . 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 Smoking status: Never Smoker  . Smokeless tobacco: Never Used  Substance and Sexual Activity  . Alcohol use: Yes    Alcohol/week: 0.0 oz    Comment: occasionally- beer  . Drug use: No  . Sexual activity: Not on file  Other Topics Concern  . Not on file  Social History Narrative  . Not on file    Past Medical History:  Diagnosis  Date  . Cancer [facility]) 2008   prostate  . 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 Colon polyp   . MRSA infection 05/2015   Right Kneee     Patient Active Problem List   Diagnosis Date Noted  . HLD (hyperlipidemia) 07/26/2015  . Arthritis, degenerative 07/26/2015  . Thyroid nodule 07/26/2015  . Malignant neoplasm of appendix ([place]) 11/20/2014  . ED (erectile dysfunction) of organic origin 01/31/2013  . H/O malignant neoplasm of prostate 01/31/2013    Past Surgical History:  Procedure Laterality Date  . APPENDECTOMY  11/15/14   patient report it was cancerous   . COLONOSCOPY  07-09-06   Dr Allen Norris  . COLONOSCOPY WITH PROPOFOL N/A 11/12/2016   Procedure: COLONOSCOPY WITH PROPOFOL;  Surgeon: Christene Lye, MD;  Location: [facility] ENDOSCOPY;  Service: Endoscopy;  Laterality: N/A;  . PROSTATE SURGERY  June 2008  . SHOULDER SURGERY  Nov 1999, Oct 2015    His family history includes Cancer in his mother; Diabetes in his father; Parkinson's disease in his father.      Current Outpatient Medications:  .  Ascorbic Acid (VITAMIN C) 1000 MG tablet, Take 1,000 mg by mouth daily., Disp: , Rfl:  .  aspirin EC 81 MG tablet, Take 81 mg by mouth daily., Disp: , Rfl:  .  celecoxib (CELEBREX) 200 MG capsule, TAKE 1 CAPSULE BY MOUTH DAILY AS NEEDED, Disp: 30 capsule, Rfl: 11 .  Multiple Vitamins-Minerals (MULTIVITAMIN ADULT PO), Take by mouth daily., Disp: , Rfl:  .  Omega-3 Fatty Acids (FISH OIL) 1200 MG CAPS, Take by mouth daily. , Disp: , Rfl:  .  sildenafil (REVATIO) 20 MG tablet, Take 20 mg by mouth daily., Disp: , Rfl: 1 .  simvastatin (ZOCOR) 10 MG tablet, TAKE 1 TABLET BY MOUTH AT BEDTIME, Disp: 90 tablet, Rfl: 3 .  vitamin E 400 UNIT capsule, Take 400 Units by mouth daily., Disp: , Rfl:   Patient Care Team: [name]., MD as PCP - General (Family Medicine) [name], MD as Consulting Physician (Internal Medicine) [name], MD as Consulting Physician (Urology)     Objective:   Vitals: BP 112/76 (BP Location: Right Arm, Patient Position: Sitting, Cuff Size: Large)   Pulse 60   Temp 98.2 F (36.8 C) (Oral)   Resp 16   Ht 5\' 9"  (1.753 m)   Wt 198 lb (89.8 kg)   SpO2 98%   BMI 29.24 kg/m   Physical Exam  Constitutional: He is oriented to person, place, and time. He appears well-developed and well-nourished. No distress.  HENT:  Head: Normocephalic.  Right Ear: Tympanic membrane and external ear normal.  Left Ear: Tympanic membrane and external ear normal.  Nose: Nose normal.  Mouth/Throat: Oropharynx is clear and moist. No oropharyngeal exudate.  Eyes: Conjunctivae and EOM are normal. Pupils are equal, round, and reactive to light. Right eye exhibits no discharge. Left eye exhibits no discharge. No scleral icterus.  Neck: Normal range of motion. No tracheal deviation present. No thyromegaly present.  Cardiovascular: Normal rate, regular rhythm and normal heart sounds.  Pulmonary/Chest: Effort normal and breath sounds normal. No respiratory distress.  Abdominal: Soft. He exhibits no distension. There is no tenderness.  Musculoskeletal: Normal range of motion. He exhibits no edema.  Lymphadenopathy:    He has no cervical adenopathy.  Neurological: He is alert and oriented to person, place, and time. He has normal reflexes.  Skin: Skin is warm and dry. He is not diaphoretic. No erythema.  Psychiatric: He has a normal mood and affect. His behavior is normal. Judgment and thought content normal.    Activities of Daily Living In your present state of health, do you have any difficulty performing the following activities: 09/07/2017  Hearing? N  Vision? N  Difficulty concentrating or making decisions? N  Walking or climbing stairs? N  Dressing or bathing? N  Doing errands, shopping? N  Preparing Food and eating ? N  Using the Toilet? N  In the past six months, have you accidently leaked urine?  N  Do you have problems with loss of bowel control? N  Managing your Medications? N  Managing your Finances? N  Housekeeping or managing your Housekeeping? N  Some recent data might be hidden    Fall Risk Assessment Fall Risk  09/07/2017 08/14/2016 08/14/2015  Falls in the past year? No No No     Depression Screen PHQ 2/9 Scores 09/07/2017 09/07/2017 08/14/2016 08/14/2015  PHQ - 2 Score 0 0 0 0  PHQ- 9 Score 0 - - -    Cognitive Testing - 6-CIT  Correct? Score   What year is it? yes 0 0 or 4  What month is it? yes 0 0 or 3  Memorize:    [place], [place],  [address],  [place],      What time is it? (within 1 hour) yes 0 0 or  3  Count backwards from 20 yes 0 0, 2, or 4  Name the months of the year yes 0 0, 2, or 4  Repeat name & address above yes 2 0, 2, 4, 6, 8, or 10       TOTAL SCORE  2/28   Interpretation:  Normal  Normal (0-7) Abnormal (8-28)      Assessment & Plan:    Annual Physical Reviewed patient's Family Medical History Reviewed and updated list of patient's medical providers Assessment of cognitive impairment was done Assessed patient's functional ability Established a written schedule for health screening [place] Completed and Reviewed  Exercise Activities and Dietary recommendations Goals    . DIET - INCREASE WATER INTAKE     Recommend increasing water intake to 6-8 glasses a day.        Immunization History  Administered Date(s) Administered  . Influenza, High Dose Seasonal PF 08/14/2016  . Influenza,inj,Quad PF,6+ Mos 08/09/2013, 08/14/2015  . Pneumococcal Conjugate-13 08/14/2015  . Pneumococcal Polysaccharide-23 09/07/2017  . Td 01/03/2005  . Tdap 07/24/2011  . Zoster 08/09/2013    Health Maintenance  Topic Date Due  . Samul Dada  07/23/2021  . COLONOSCOPY  11/13/2026  . INFLUENZA VACCINE  Completed  . Hepatitis C Screening  Completed  . PNA vac Low Risk Adult  Completed     Discussed health benefits of physical  activity, and encouraged him to engage in regular exercise appropriate for his age and condition.    ------------------------------------------------------------------------------------------------------------  1. Annual physical exam Stable. Recheck 1 year.  2. H/O malignant neoplasm of prostate Will check labs. FU pending results. - PSA  3. Hyperlipidemia, unspecified hyperlipidemia type Will check labs. FU pending results. - Lipid panel - Comprehensive metabolic panel - CBC with Differential/Platelet - TSH  4. Prediabetes Will check labs. FU pending results. - Hemoglobin A1c  Patient seen and examined by Miguel Aschoff, MD, and note scribed by Martha Clan, CMA.  Yoshua [facility] Cranford Mon, MD  [facility]

## 2017-09-09 LAB — COMPREHENSIVE METABOLIC PANEL
A/G RATIO: 2 (ref 1.2–2.2)
ALBUMIN: 4.3 g/dL (ref 3.6–4.8)
ALT: 30 IU/L (ref 0–44)
AST: 24 IU/L (ref 0–40)
Alkaline Phosphatase: 34 IU/L — ABNORMAL LOW (ref 39–117)
BUN / CREAT RATIO: 17 (ref 10–24)
BUN: 16 mg/dL (ref 8–27)
Bilirubin Total: 0.8 mg/dL (ref 0.0–1.2)
CO2: 21 mmol/L (ref 20–29)
Calcium: 8.9 mg/dL (ref 8.6–10.2)
Chloride: 104 mmol/L (ref 96–106)
Creatinine, Ser: 0.92 mg/dL (ref 0.76–1.27)
GFR, EST AFRICAN AMERICAN: 99 mL/min/{1.73_m2} (ref >59)
GFR, EST NON AFRICAN AMERICAN: 86 mL/min/{1.73_m2} (ref >59)
GLOBULIN, TOTAL: 2.2 g/dL (ref 1.5–4.5)
Glucose: 94 mg/dL (ref 65–99)
POTASSIUM: 4.1 mmol/L (ref 3.5–5.2)
SODIUM: 142 mmol/L (ref 134–144)
TOTAL PROTEIN: 6.5 g/dL (ref 6.0–8.5)

## 2017-09-09 LAB — CBC WITH DIFFERENTIAL/PLATELET
BASOS ABS: 0 10*3/uL (ref 0.0–0.2)
BASOS: 0 %
EOS (ABSOLUTE): 0.1 10*3/uL (ref 0.0–0.4)
Eos: 1 %
Hematocrit: 39.2 % (ref 37.5–51.0)
Hemoglobin: 13.4 g/dL (ref 13.0–17.7)
Immature Grans (Abs): 0 10*3/uL (ref 0.0–0.1)
Immature Granulocytes: 0 %
Lymphocytes Absolute: 1.5 10*3/uL (ref 0.7–3.1)
Lymphs: 15 %
MCH: 32 pg (ref 26.6–33.0)
MCHC: 34.2 g/dL (ref 31.5–35.7)
MCV: 94 fL (ref 79–97)
MONOS ABS: 0.9 10*3/uL (ref 0.1–0.9)
Monocytes: 9 %
NEUTROS ABS: 7.6 10*3/uL — AB (ref 1.4–7.0)
NEUTROS PCT: 75 %
Platelets: 180 10*3/uL (ref 150–379)
RBC: 4.19 x10E6/uL (ref 4.14–5.80)
RDW: 13.3 % (ref 12.3–15.4)
WBC: 10 10*3/uL (ref 3.4–10.8)

## 2017-09-09 LAB — LIPID PANEL
CHOLESTEROL TOTAL: 170 mg/dL (ref 100–199)
Chol/HDL Ratio: 2.8 ratio (ref 0.0–5.0)
HDL: 60 mg/dL (ref >39)
LDL CALC: 95 mg/dL (ref 0–99)
TRIGLYCERIDES: 75 mg/dL (ref 0–149)
VLDL Cholesterol Cal: 15 mg/dL (ref 5–40)

## 2017-09-09 LAB — HEMOGLOBIN A1C
Est. average glucose Bld gHb Est-mCnc: 103 mg/dL
Hgb A1c MFr Bld: 5.2 % (ref 4.8–5.6)

## 2017-09-09 LAB — PSA: Prostate Specific Ag, Serum: <0.1 ng/mL (ref 0.0–4.0)

## 2017-09-09 LAB — TSH: TSH: 0.937 u[IU]/mL (ref 0.450–4.500)

## 2017-11-13 DIAGNOSIS — L821 Other seborrheic keratosis: Secondary | ICD-10-CM | POA: Diagnosis not present

## 2017-11-13 DIAGNOSIS — L570 Actinic keratosis: Secondary | ICD-10-CM | POA: Diagnosis not present

## 2017-11-13 DIAGNOSIS — D225 Melanocytic nevi of trunk: Secondary | ICD-10-CM | POA: Diagnosis not present

## 2017-11-13 DIAGNOSIS — D2261 Melanocytic nevi of right upper limb, including shoulder: Secondary | ICD-10-CM | POA: Diagnosis not present

## 2017-11-13 DIAGNOSIS — D2272 Melanocytic nevi of left lower limb, including hip: Secondary | ICD-10-CM | POA: Diagnosis not present

## 2017-11-13 DIAGNOSIS — D2271 Melanocytic nevi of right lower limb, including hip: Secondary | ICD-10-CM | POA: Diagnosis not present

## 2017-11-13 DIAGNOSIS — X32XXXA Exposure to sunlight, initial encounter: Secondary | ICD-10-CM | POA: Diagnosis not present

## 2017-11-13 DIAGNOSIS — Z85828 Personal history of other malignant neoplasm of skin: Secondary | ICD-10-CM | POA: Diagnosis not present

## 2017-11-13 DIAGNOSIS — D2262 Melanocytic nevi of left upper limb, including shoulder: Secondary | ICD-10-CM | POA: Diagnosis not present

## 2017-11-13 DIAGNOSIS — Z08 Encounter for follow-up examination after completed treatment for malignant neoplasm: Secondary | ICD-10-CM | POA: Diagnosis not present

## 2017-12-19 ENCOUNTER — Other Ambulatory Visit: Payer: Self-pay | Admitting: Family Medicine

## 2017-12-19 DIAGNOSIS — E7800 Pure hypercholesterolemia, unspecified: Secondary | ICD-10-CM

## 2018-01-14 ENCOUNTER — Other Ambulatory Visit: Payer: Self-pay | Admitting: Family Medicine

## 2018-01-14 NOTE — Telephone Encounter (Signed)
Pharmacy requesting refills. Thanks!  

## 2018-02-22 ENCOUNTER — Inpatient Hospital Stay: Payer: PPO | Attending: Internal Medicine

## 2018-02-22 DIAGNOSIS — Z8614 Personal history of Methicillin resistant Staphylococcus aureus infection: Secondary | ICD-10-CM | POA: Diagnosis not present

## 2018-02-22 DIAGNOSIS — Z7982 Long term (current) use of aspirin: Secondary | ICD-10-CM | POA: Diagnosis not present

## 2018-02-22 DIAGNOSIS — Z809 Family history of malignant neoplasm, unspecified: Secondary | ICD-10-CM | POA: Insufficient documentation

## 2018-02-22 DIAGNOSIS — Z86010 Personal history of colonic polyps: Secondary | ICD-10-CM | POA: Insufficient documentation

## 2018-02-22 DIAGNOSIS — Z8589 Personal history of malignant neoplasm of other organs and systems: Secondary | ICD-10-CM | POA: Insufficient documentation

## 2018-02-22 DIAGNOSIS — Z8546 Personal history of malignant neoplasm of prostate: Secondary | ICD-10-CM | POA: Insufficient documentation

## 2018-02-22 DIAGNOSIS — C181 Malignant neoplasm of appendix: Secondary | ICD-10-CM

## 2018-02-22 DIAGNOSIS — Z79899 Other long term (current) drug therapy: Secondary | ICD-10-CM | POA: Insufficient documentation

## 2018-02-22 LAB — COMPREHENSIVE METABOLIC PANEL
ALT: 31 U/L (ref 0–44)
AST: 27 U/L (ref 15–41)
Albumin: 4 g/dL (ref 3.5–5.0)
Alkaline Phosphatase: 31 U/L — ABNORMAL LOW (ref 38–126)
Anion gap: 7 (ref 5–15)
BILIRUBIN TOTAL: 1 mg/dL (ref 0.3–1.2)
BUN: 21 mg/dL (ref 8–23)
CHLORIDE: 106 mmol/L (ref 98–111)
CO2: 26 mmol/L (ref 22–32)
CREATININE: 1.04 mg/dL (ref 0.61–1.24)
Calcium: 8.7 mg/dL — ABNORMAL LOW (ref 8.9–10.3)
Glucose, Bld: 104 mg/dL — ABNORMAL HIGH (ref 70–99)
POTASSIUM: 4.2 mmol/L (ref 3.5–5.1)
Sodium: 139 mmol/L (ref 135–145)
TOTAL PROTEIN: 6.7 g/dL (ref 6.5–8.1)

## 2018-02-22 LAB — CBC
HCT: 42.7 % (ref 40.0–52.0)
Hemoglobin: 14.6 g/dL (ref 13.0–18.0)
MCH: 32.2 pg (ref 26.0–34.0)
MCHC: 34.3 g/dL (ref 32.0–36.0)
MCV: 93.9 fL (ref 80.0–100.0)
PLATELETS: 168 10*3/uL (ref 150–440)
RBC: 4.55 MIL/uL (ref 4.40–5.90)
RDW: 13 % (ref 11.5–14.5)
WBC: 3.9 10*3/uL (ref 3.8–10.6)

## 2018-02-23 LAB — CEA: CEA1: 1.1 ng/mL (ref 0.0–4.7)

## 2018-03-01 ENCOUNTER — Other Ambulatory Visit: Payer: Self-pay

## 2018-03-01 ENCOUNTER — Inpatient Hospital Stay (HOSPITAL_BASED_OUTPATIENT_CLINIC_OR_DEPARTMENT_OTHER): Payer: PPO | Admitting: Internal Medicine

## 2018-03-01 VITALS — BP 124/72 | HR 54 | Temp 97.5°F | Resp 18 | Ht 69.0 in | Wt 190.0 lb

## 2018-03-01 DIAGNOSIS — Z809 Family history of malignant neoplasm, unspecified: Secondary | ICD-10-CM

## 2018-03-01 DIAGNOSIS — Z8614 Personal history of Methicillin resistant Staphylococcus aureus infection: Secondary | ICD-10-CM | POA: Diagnosis not present

## 2018-03-01 DIAGNOSIS — Z86010 Personal history of colonic polyps: Secondary | ICD-10-CM | POA: Diagnosis not present

## 2018-03-01 DIAGNOSIS — Z7982 Long term (current) use of aspirin: Secondary | ICD-10-CM | POA: Diagnosis not present

## 2018-03-01 DIAGNOSIS — Z8589 Personal history of malignant neoplasm of other organs and systems: Secondary | ICD-10-CM | POA: Diagnosis not present

## 2018-03-01 DIAGNOSIS — Z79899 Other long term (current) drug therapy: Secondary | ICD-10-CM

## 2018-03-01 DIAGNOSIS — C181 Malignant neoplasm of appendix: Secondary | ICD-10-CM

## 2018-03-01 DIAGNOSIS — Z8546 Personal history of malignant neoplasm of prostate: Secondary | ICD-10-CM | POA: Diagnosis not present

## 2018-03-01 NOTE — Progress Notes (Signed)
[place] OFFICE PROGRESS NOTE  Patient Care Team: [name]., MD as PCP - General (Family Medicine) [name], MD as Consulting Physician (Internal Medicine) [name], MD as Consulting Physician (Urology)  Cancer Staging No matching staging information was found for the patient.   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  Dr.Sankar] last CT March 2018- NED [discussed not doing further surveillance scans]  # colo [Dr.sankar April 2018- repeat colo in 5 years]  # Prostate cancer 2008 s/p Prostatectomy [Dr.Stoiff].      Malignant neoplasm of appendix [facility])      INTERVAL HISTORY:  Edward Mccoy 67 y.o.  male pleasant patient above history of carcinoma of the appendix stage I and history of prostate cancer is here for follow-up.  Patient denies abdominal pain nausea vomiting.  Denies any bone pain.  Physically very active. Review of Systems  Constitutional: Negative for chills, diaphoresis, fever, malaise/fatigue and weight loss.  HENT: Negative for nosebleeds and sore throat.   Eyes: Negative for double vision.  Respiratory: Negative for cough, hemoptysis, sputum production, shortness of breath and wheezing.   Cardiovascular: Negative for chest pain, palpitations, orthopnea and leg swelling.  Gastrointestinal: Negative for abdominal pain, blood in stool, constipation, diarrhea, heartburn, melena, nausea and vomiting.  Genitourinary: Negative for dysuria, frequency and urgency.  Musculoskeletal: Negative for back pain and joint pain.  Skin: Negative.  Negative for itching and rash.  Neurological: Negative for dizziness, tingling, focal weakness, weakness and headaches.  Endo/Heme/Allergies: Does not bruise/bleed easily.  Psychiatric/Behavioral: Negative for depression. The patient is not  nervous/anxious and does not have insomnia.       PAST MEDICAL HISTORY :  Past Medical History:  Diagnosis Date  . Cancer [facility]) 2008   prostate  . Cancer of appendix ([place]) 11/15/14   high-grade appendiceal mucinous neoplasm  . Colon polyp   . MRSA infection 05/2015   Right Kneee    PAST SURGICAL HISTORY :   Past Surgical History:  Procedure Laterality Date  . APPENDECTOMY  11/15/14   patient report it was cancerous   . COLONOSCOPY  07-09-06   Dr Allen Norris  . COLONOSCOPY WITH PROPOFOL N/A 11/12/2016   Procedure: COLONOSCOPY WITH PROPOFOL;  Surgeon: Christene Lye, MD;  Location: [facility] ENDOSCOPY;  Service: Endoscopy;  Laterality: N/A;  . PROSTATE SURGERY  June 2008  . SHOULDER SURGERY  Nov 1999, Oct 2015    FAMILY HISTORY :   Family History  Problem Relation Age of Onset  . Cancer Mother        unknown type of ca  . Diabetes Father   . Parkinson's disease Father     SOCIAL HISTORY:   Social History   Tobacco Use  . Smoking status: Never Smoker  . Smokeless tobacco: Never Used  Substance Use Topics  . Alcohol use: Yes    Alcohol/week: 0.0 oz    Comment: occasionally- beer  . Drug use: No    ALLERGIES:  has No Known Allergies.  MEDICATIONS:  Current Outpatient Medications  Medication Sig Dispense Refill  . Ascorbic Acid (VITAMIN C) 1000 MG tablet Take 1,000 mg by mouth daily.    Marland Kitchen aspirin EC 81 MG tablet Take 81 mg by mouth daily.    . Multiple Vitamins-Minerals (MULTIVITAMIN ADULT PO) Take by mouth daily.    . Omega-3 Fatty Acids (FISH OIL)  1200 MG CAPS Take by mouth daily.     . sildenafil (REVATIO) 20 MG tablet Take 20 mg by mouth daily.  1  . simvastatin (ZOCOR) 10 MG tablet TAKE 1 TABLET BY MOUTH AT BEDTIME 90 tablet 3  . celecoxib (CELEBREX) 200 MG capsule TAKE 1 CAPSULE BY MOUTH DAILY AS NEEDED (Patient not taking: Reported on 03/01/2018) 30 capsule 5   No current facility-administered medications for this visit.     PHYSICAL EXAMINATION: ECOG  PERFORMANCE STATUS: 0 - Asymptomatic  BP 124/72   Pulse (!) 54   Temp (!) 97.5 F (36.4 C) (Tympanic)   Resp 18   Ht 5\' 9"  (1.753 m)   Wt 190 lb (86.2 kg)   BMI 28.06 kg/m   Filed Weights   03/01/18 1453  Weight: 190 lb (86.2 kg)    GENERAL: Well-nourished well-developed; Alert, no distress and comfortable.  Alone.  Y.  EYES: no pallor or icterus OROPHARYNX: no thrush or ulceration; NECK: supple; no lymph nodes felt. LYMPH:  no palpable lymphadenopathy in the axillary or inguinal regions LUNGS: Decreased breath sounds auscultation bilaterally. No wheeze or crackles HEART/CVS: regular rate & rhythm and no murmurs; No lower extremity edema ABDOMEN:abdomen soft, non-tender and normal bowel sounds. No hepatomegaly or splenomegaly.  Musculoskeletal:no cyanosis of digits and no clubbing  PSYCH: alert & oriented x 3 with fluent speech NEURO: no focal motor/sensory deficits SKIN:  no rashes or significant lesions    LABORATORY DATA:  I have reviewed the data as listed    Component Value Date/Time   NA 139 02/22/2018 0900   NA 142 09/08/2017 1122   NA 139 11/13/2014 1553   K 4.2 02/22/2018 0900   K 3.9 11/13/2014 1553   CL 106 02/22/2018 0900   CL 104 11/13/2014 1553   CO2 26 02/22/2018 0900   CO2 28 11/13/2014 1553   GLUCOSE 104 (H) 02/22/2018 0900   GLUCOSE 93 11/13/2014 1553   BUN 21 02/22/2018 0900   BUN 16 09/08/2017 1122   BUN 18 11/13/2014 1553   CREATININE 1.04 02/22/2018 0900   CREATININE 1.02 11/13/2014 1553   CALCIUM 8.7 (L) 02/22/2018 0900   CALCIUM 9.1 11/13/2014 1553   PROT 6.7 02/22/2018 0900   PROT 6.5 09/08/2017 1122   PROT 7.3 11/13/2014 1553   ALBUMIN 4.0 02/22/2018 0900   ALBUMIN 4.3 09/08/2017 1122   ALBUMIN 4.6 11/13/2014 1553   AST 27 02/22/2018 0900   AST 30 11/13/2014 1553   ALT 31 02/22/2018 0900   ALT 34 11/13/2014 1553   ALKPHOS 31 (L) 02/22/2018 0900   ALKPHOS 39 11/13/2014 1553   BILITOT 1.0 02/22/2018 0900   BILITOT 0.8  09/08/2017 1122   BILITOT 0.9 11/13/2014 1553   GFRNONAA >60 02/22/2018 0900   GFRNONAA >60 11/13/2014 1553   GFRAA >60 02/22/2018 0900   GFRAA >60 11/13/2014 1553    No results found for: SPEP, UPEP  Lab Results  Component Value Date   WBC 3.9 02/22/2018   NEUTROABS 7.6 (H) 09/08/2017   HGB 14.6 02/22/2018   HCT 42.7 02/22/2018   MCV 93.9 02/22/2018   PLT 168 02/22/2018      Chemistry      Component Value Date/Time   NA 139 02/22/2018 0900   NA 142 09/08/2017 1122   NA 139 11/13/2014 1553   K 4.2 02/22/2018 0900   K 3.9 11/13/2014 1553   CL 106 02/22/2018 0900   CL 104 11/13/2014 1553   CO2 26  02/22/2018 0900   CO2 28 11/13/2014 1553   BUN 21 02/22/2018 0900   BUN 16 09/08/2017 1122   BUN 18 11/13/2014 1553   CREATININE 1.04 02/22/2018 0900   CREATININE 1.02 11/13/2014 1553   GLU 102 08/10/2014      Component Value Date/Time   CALCIUM 8.7 (L) 02/22/2018 0900   CALCIUM 9.1 11/13/2014 1553   ALKPHOS 31 (L) 02/22/2018 0900   ALKPHOS 39 11/13/2014 1553   AST 27 02/22/2018 0900   AST 30 11/13/2014 1553   ALT 31 02/22/2018 0900   ALT 34 11/13/2014 1553   BILITOT 1.0 02/22/2018 0900   BILITOT 0.8 09/08/2017 1122   BILITOT 0.9 11/13/2014 1553       RADIOGRAPHIC STUDIES: I have personally reviewed the radiological images as listed and agreed with the findings in the report. No results found.   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 Stable  # Prostate cancer-clinically no evidence of recurrence; PSA-February 2019 -undetectable.  Continue follow-up with cardiology.  # Follow-up in 12 months with labs- few days prior.    Orders Placed This Encounter  Procedures  . CBC with Differential/Platelet    Standing Status:   Future    Standing Expiration Date:   03/02/2019  . Comprehensive metabolic panel    Standing Status:    Future    Standing Expiration Date:   03/02/2019  . CEA    Standing Status:   Future    Standing Expiration Date:   03/02/2019   All questions were answered. The patient knows to call the clinic with any problems, questions or concerns.      [name], MD 03/01/2018 4:36 PM

## 2018-03-01 NOTE — Assessment & Plan Note (Addendum)
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 Stable  # Prostate cancer-clinically no evidence of recurrence; PSA-February 2019 -undetectable.  Continue follow-up with cardiology.  # Follow-up in 12 months with labs- few days prior.

## 2018-03-02 ENCOUNTER — Telehealth: Payer: Self-pay | Admitting: Urology

## 2018-03-02 MED ORDER — SILDENAFIL CITRATE 20 MG PO TABS
20.0000 mg | ORAL_TABLET | Freq: Every day | ORAL | 1 refills | Status: DC
Start: 2018-03-02 — End: 2018-03-19

## 2018-03-02 NOTE — Telephone Encounter (Signed)
Patient is calling checking on his refill request for sildenafil  Please call them back at [phone]   Thanks, Sharyn Lull

## 2018-03-02 NOTE — Telephone Encounter (Signed)
Patient has an appointment scheduled. A refill was sent to hold him over until his appointment.

## 2018-03-16 ENCOUNTER — Telehealth: Payer: Self-pay | Admitting: Urology

## 2018-03-16 NOTE — Telephone Encounter (Signed)
Pt normally gets 90 pills of Sildenafil, he only got 10 the last time.  Can you check on this and find out why he only got 10 instead of 90 and give pt a call.

## 2018-03-19 MED ORDER — SILDENAFIL CITRATE 20 MG PO TABS: 20.0000 mg | ORAL_TABLET | Freq: Every day | ORAL | 0 refills | Status: DC

## 2018-03-19 NOTE — Telephone Encounter (Signed)
RX for Sildenafil was corrected and sent to pharmacy

## 2018-04-08 ENCOUNTER — Ambulatory Visit: Payer: PPO | Admitting: Urology

## 2018-04-16 ENCOUNTER — Encounter: Payer: Self-pay | Admitting: Urology

## 2018-04-16 ENCOUNTER — Ambulatory Visit (INDEPENDENT_AMBULATORY_CARE_PROVIDER_SITE_OTHER): Payer: PPO | Admitting: Urology

## 2018-04-16 VITALS — BP 118/70 | HR 54 | Ht 70.0 in | Wt 189.4 lb

## 2018-04-16 DIAGNOSIS — Z8546 Personal history of malignant neoplasm of prostate: Secondary | ICD-10-CM | POA: Diagnosis not present

## 2018-04-16 DIAGNOSIS — N5231 Erectile dysfunction following radical prostatectomy: Secondary | ICD-10-CM | POA: Insufficient documentation

## 2018-04-16 NOTE — Progress Notes (Signed)
04/16/2018 9:33 AM   Edward Mccoy [birth year] [id number]  Referring provider: [name]., MD [address] [place], [place] [address]  Chief Complaint  Patient presents with  . Establish Care   Urologic history: 1. pT2c Gleason 3+4 adenocarcinoma the prostate - RALP 09/2006; PSA remains undetectable  2.  Post prostatectomy erectile dysfunction -Doing well on Trimix   HPI: 67 year old male presents for annual follow-up.  He had a PSA in Dr. Alben Spittle office February 2019 which was undetectable at <0.1. He has no voiding complaints.  Denies dysuria or gross hematuria.  Denies flank, abdominal, pelvic or scrotal pain.   PMH: Past Medical History:  Diagnosis Date  . Cancer [facility]) 2008   prostate  . Cancer of appendix ([place]) 11/15/14   high-grade appendiceal mucinous neoplasm  . Colon polyp   . MRSA infection 05/2015   Right Kneee    Surgical History: Past Surgical History:  Procedure Laterality Date  . APPENDECTOMY  11/15/14   patient report it was cancerous   . COLONOSCOPY  07-09-06   Dr Allen Norris  . COLONOSCOPY WITH PROPOFOL N/A 11/12/2016   Procedure: COLONOSCOPY WITH PROPOFOL;  Surgeon: Christene Lye, MD;  Location: [facility] ENDOSCOPY;  Service: Endoscopy;  Laterality: N/A;  . PROSTATE SURGERY  June 2008  . SHOULDER SURGERY  Nov 1999, Oct 2015    Home Medications:  Allergies as of 04/16/2018   No Known Allergies     Medication List        Accurate as of 04/16/18  9:33 AM. Always use your most recent med list.          aspirin EC 81 MG tablet Take 81 mg by mouth daily.   celecoxib 200 MG capsule Commonly known as:  CELEBREX TAKE 1 CAPSULE BY MOUTH DAILY AS NEEDED   Fish Oil 1200 MG Caps Take by mouth daily.   MULTIVITAMIN ADULT PO Take by mouth daily.   simvastatin 10 MG tablet Commonly known as:  ZOCOR TAKE 1 TABLET BY MOUTH AT BEDTIME   vitamin C 1000 MG tablet Take 1,000 mg by mouth daily.       Allergies: No  Known Allergies  Family History: Family History  Problem Relation Age of Onset  . Cancer Mother        unknown type of ca  . Diabetes Father   . Parkinson's disease Father     Social History:  reports that he has never smoked. He has never used smokeless tobacco. He reports that he drinks alcohol. He reports that he does not use drugs.  ROS: UROLOGY Frequent Urination?: No Hard to postpone urination?: No Burning/pain with urination?: No Get up at night to urinate?: No Leakage of urine?: No Urine stream starts and stops?: No Trouble starting stream?: No Do you have to strain to urinate?: No Blood in urine?: No Urinary tract infection?: No Sexually transmitted disease?: No Injury to kidneys or bladder?: No Painful intercourse?: No Weak stream?: No Erection problems?: No Penile pain?: No  Gastrointestinal Nausea?: No Vomiting?: No Indigestion/heartburn?: No Diarrhea?: No Constipation?: No  Constitutional Fever: No Night sweats?: No Weight loss?: No Fatigue?: No  Skin Skin rash/lesions?: No Itching?: No  Eyes Blurred vision?: No Double vision?: No  Ears/Nose/Throat Sore throat?: No Sinus problems?: No  Hematologic/Lymphatic Swollen glands?: No Easy bruising?: No  Cardiovascular Leg swelling?: No Chest pain?: No  Respiratory Cough?: No Shortness of breath?: No  Endocrine Excessive thirst?: No  Musculoskeletal Back pain?: No  Joint pain?: No  Neurological Headaches?: No Dizziness?: No  Psychologic Depression?: No Anxiety?: No  Physical Exam: BP 118/70 (BP Location: Left Arm, Patient Position: Sitting, Cuff Size: Normal)   Pulse (!) 54   Ht 5\' 10"  (1.778 m)   Wt 189 lb 6.4 oz (85.9 kg)   BMI 27.18 kg/m   Constitutional:  Alert and oriented, No acute distress. HEENT: [place] AT, moist mucus membranes.  Trachea midline, no masses. Cardiovascular: No clubbing, cyanosis, or edema. Respiratory: Normal respiratory effort, no increased work of  breathing. Skin: No rashes, bruises or suspicious lesions. Neurologic: Grossly intact, no focal deficits, moving all 4 extremities. Psychiatric: Normal mood and affect.   Assessment & Plan:   67 year old male with history of prostate cancer 11+ years after radical prostatectomy with an undetectable PSA.  He is doing well on Trimix for ED which was refilled at [facility].  Return in about 1 year (around 04/17/2019) for Recheck.   [name], [place] [address], [place], [place] [address] [phone]

## 2018-05-24 DIAGNOSIS — M25561 Pain in right knee: Secondary | ICD-10-CM | POA: Diagnosis not present

## 2018-05-24 DIAGNOSIS — M1711 Unilateral primary osteoarthritis, right knee: Secondary | ICD-10-CM | POA: Diagnosis not present

## 2018-07-05 ENCOUNTER — Telehealth: Payer: Self-pay | Admitting: Urology

## 2018-07-05 NOTE — Telephone Encounter (Signed)
Please advise 

## 2018-07-05 NOTE — Telephone Encounter (Signed)
Asking for sildenafil 20mg  90 tablets to be sent to [facility] in [place] [address]. Please advise. Thanks.

## 2018-07-07 MED ORDER — SILDENAFIL CITRATE 20 MG PO TABS: ORAL_TABLET | ORAL | 0 refills | Status: DC

## 2018-07-07 NOTE — Telephone Encounter (Signed)
Rx sent 

## 2018-07-18 ENCOUNTER — Other Ambulatory Visit: Payer: Self-pay | Admitting: Family Medicine

## 2018-09-08 ENCOUNTER — Ambulatory Visit (INDEPENDENT_AMBULATORY_CARE_PROVIDER_SITE_OTHER): Payer: PPO

## 2018-09-08 VITALS — BP 126/68 | HR 54 | Temp 98.6°F | Ht 70.0 in | Wt 193.8 lb

## 2018-09-08 DIAGNOSIS — Z0000 Encounter for general adult medical examination without abnormal findings: Secondary | ICD-10-CM | POA: Diagnosis not present

## 2018-09-08 NOTE — Progress Notes (Signed)
Subjective:   Edward Mccoy is a 68 y.o. male who presents for Medicare Annual/Subsequent preventive examination.  Review of Systems:  N/A  Cardiac Risk Factors include: advanced age (>74men, >72 women);dyslipidemia;male gender     Objective:    Vitals: BP 126/68 (BP Location: Right Arm)   Pulse (!) 54   Temp 98.6 F (37 C) (Oral)   Ht 5\' 10"  (1.778 m)   Wt 193 lb 12.8 oz (87.9 kg)   BMI 27.81 kg/m   Body mass index is 27.81 kg/m.  Advanced Directives 09/08/2018 03/01/2018 09/07/2017 08/31/2017 03/02/2017 11/12/2016 08/22/2016  Does Patient Have a Medical Advance Directive? Yes No Yes Yes Yes Yes Yes  Type of Advance Directive Living will - - Living will;Healthcare Power of [place];Living will Out of facility DNR (pink MOST or yellow form) [place];Living will  Does patient want to make changes to medical advance directive? - - - No - Patient declined - - -  Copy of Healthcare Power of Attorney in Chart? - - Yes No - copy requested - - No - copy requested  Would patient like information on creating a medical advance directive? - No - Patient declined - - - - -    Tobacco Social History   Tobacco Use  Smoking Status Never Smoker  Smokeless Tobacco Never Used     Counseling given: Not Answered   Clinical Intake:  Pre-visit preparation completed: Yes  Pain : No/denies pain Pain Score: 0-No pain     Nutritional Status: BMI 25 -29 Overweight Nutritional Risks: None Diabetes: No  How often do you need to have someone help you when you read instructions, pamphlets, or other written materials from your doctor or pharmacy?: 1 - Never  Interpreter Needed?: No  Information entered by :: [facility], LPN  Past Medical History:  Diagnosis Date  . Cancer [facility]) 2008   prostate  . Cancer of appendix ([place]) 11/15/14   high-grade appendiceal mucinous neoplasm  . Colon polyp   . MRSA infection 05/2015   Right Kneee   Past  Surgical History:  Procedure Laterality Date  . APPENDECTOMY  11/15/14   patient report it was cancerous   . COLONOSCOPY  07-09-06   Dr Allen Norris  . COLONOSCOPY WITH PROPOFOL N/A 11/12/2016   Procedure: COLONOSCOPY WITH PROPOFOL;  Surgeon: Christene Lye, MD;  Location: [facility] ENDOSCOPY;  Service: Endoscopy;  Laterality: N/A;  . PROSTATE SURGERY  June 2008  . SHOULDER SURGERY  Nov 1999, Oct 2015   Family History  Problem Relation Age of Onset  . Cancer Mother        unknown type of ca  . Diabetes Father   . Parkinson's disease Father    Social History   Socioeconomic History  . Marital status: Married    Spouse name: [name]  . Number of children: 1  . Years of education: 26  . Highest education level: Bachelor's degree (e.g., BA, AB, BS)  Occupational History  . 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 Financial resource strain: Not hard at all  . Food insecurity:    Worry: Never true    Inability: Never true  . Transportation needs:    Medical: No    Non-medical: No  Tobacco Use  . Smoking status: Never Smoker  . Smokeless tobacco: Never Used  Substance and Sexual Activity  . Alcohol use: Yes    Alcohol/week: 0.0 standard drinks    Comment: occasionally- beer  .  Drug use: No  . Sexual activity: Yes  Lifestyle  . Physical activity:    Days per week: 5 days    Minutes per session: 30 min  . Stress: Not at all  Relationships  . Social connections:    Talks on phone: Patient refused    Gets together: Patient refused    Attends religious service: Patient refused    Active member of club or organization: Patient refused    Attends meetings of clubs or organizations: Patient refused    Relationship status: Patient refused  Other Topics Concern  . Not on file  Social History Narrative  . Not on file    Outpatient Encounter Medications as of 09/08/2018  Medication Sig  . Ascorbic Acid (VITAMIN C) 1000 MG tablet Take 1,000 mg by mouth daily.  Marland Kitchen aspirin EC 81 MG  tablet Take 81 mg by mouth daily.  . celecoxib (CELEBREX) 200 MG capsule TAKE 1 CAPSULE BY MOUTH DAILY AS NEEDED  . Multiple Vitamins-Minerals (MULTIVITAMIN ADULT PO) Take by mouth daily.  . Omega-3 Fatty Acids (FISH OIL) 1200 MG CAPS Take by mouth daily.   . sildenafil (REVATIO) 20 MG tablet 2-5 tabs 1 hour prior to intercourse (Patient taking differently: Take 20 mg by mouth daily. 2-5 tabs 1 hour prior to intercourse)  . simvastatin (ZOCOR) 10 MG tablet TAKE 1 TABLET BY MOUTH AT BEDTIME   No facility-administered encounter medications on file as of 09/08/2018.     Activities of Daily Living In your present state of health, do you have any difficulty performing the following activities: 09/08/2018  Hearing? N  Vision? N  Comment Wears contacts and has readers.   Difficulty concentrating or making decisions? N  Walking or climbing stairs? N  Dressing or bathing? N  Doing errands, shopping? N  Preparing Food and eating ? N  Using the Toilet? N  In the past six months, have you accidently leaked urine? N  Do you have problems with loss of bowel control? N  Managing your Medications? N  Managing your Finances? N  Housekeeping or managing your Housekeeping? N  Some recent data might be hidden    Patient Care Team: [name]., MD as PCP - General (Family Medicine) [name], MD as Consulting Physician (Internal Medicine) [name], MD as Consulting Physician (Urology)   Assessment:   This is a routine wellness examination for [name].  Exercise Activities and Dietary recommendations Current Exercise Habits: Structured exercise class, Type of exercise: Other - see comments(running), Time (Minutes): 30(to 1 hour), Frequency (Times/Week): 5, Weekly Exercise (Minutes/Week): 150, Intensity: Moderate, Exercise limited by: None identified  Goals    . DIET - INCREASE WATER INTAKE     Recommend increasing water intake to 6-8 glasses a day.        Fall  Risk Fall Risk  09/08/2018 09/07/2017 08/14/2016 08/14/2015  Falls in the past year? 0 No No No   FALL RISK PREVENTION PERTAINING TO THE HOME:  Any stairs in or around the home WITH handrails? No  Home free of loose throw rugs in walkways, pet beds, electrical cords, etc? Yes  Adequate lighting in your home to reduce risk of falls? Yes   ASSISTIVE DEVICES UTILIZED TO PREVENT FALLS:  Life alert? No  Use of a cane, walker or w/c? No  Grab bars in the bathroom? No  Shower chair or bench in shower? No  Elevated toilet seat or a handicapped toilet? Yes  TIMED UP AND GO:  Was the test performed? No .     Depression Screen PHQ 2/9 Scores 09/08/2018 09/07/2017 09/07/2017 08/14/2016  PHQ - 2 Score 0 0 0 0  PHQ- 9 Score - 0 - -    Cognitive Function: Declined today.      6CIT Screen 08/14/2016  What Year? 0 points  What month? 0 points  What time? 0 points  Count back from 20 0 points  Months in reverse 0 points  Repeat phrase 2 points  Total Score 2    Immunization History  Administered Date(s) Administered  . Influenza, High Dose Seasonal PF 08/14/2016, 06/16/2017, 06/09/2018  . Influenza,inj,Quad PF,6+ Mos 08/09/2013, 08/14/2015  . Pneumococcal Conjugate-13 08/14/2015  . Pneumococcal Polysaccharide-23 06/14/2017, 09/07/2017  . Td 01/03/2005  . Tdap 07/24/2011  . Zoster 08/09/2013    Qualifies for Shingles Vaccine? Yes  Zostavax completed 08/09/13. Due for Shingrix. Education has been provided regarding the importance of this vaccine. Pt has been advised to call insurance company to determine out of pocket expense. Advised may also receive vaccine at local pharmacy or Health Dept. Verbalized acceptance and understanding.  Tdap: Up to date  Flu Vaccine: Up to date  Pneumococcal Vaccine: Up to date   Screening Tests Health Maintenance  Topic Date Due  . TETANUS/TDAP  07/23/2021  . COLONOSCOPY  11/12/2021  . INFLUENZA VACCINE  Completed  . Hepatitis C Screening   Completed  . PNA vac Low Risk Adult  Completed   Cancer Screenings:  Colorectal Screening: Completed 11/12/16. Repeat every 5 years.  Lung Cancer Screening: (Low Dose CT Chest recommended if Age 10-80 years, 30 pack-year currently smoking OR have quit w/in 15years.) does not qualify.    Additional Screening:  Hepatitis C Screening: Up to date  Vision Screening: Recommended annual ophthalmology exams for early detection of glaucoma and other disorders of the eye.  Dental Screening: Recommended annual dental exams for proper oral hygiene  Community Resource Referral:  CRR required this visit?  No        Plan:  I have personally reviewed and addressed the Medicare Annual Wellness questionnaire and have noted the following in the patient's chart:  A. Medical and social history B. Use of alcohol, tobacco or illicit drugs  C. Current medications and supplements D. Functional ability and status E.  Nutritional status F.  Physical activity G. Advance directives H. List of other physicians I.  Hospitalizations, surgeries, and ER visits in previous 12 months J.  [place] such as hearing and vision if needed, cognitive and depression L. Referrals and appointments - none  In addition, I have reviewed and discussed with patient certain preventive protocols, quality metrics, and best practice recommendations. A written personalized care plan for preventive services as well as general preventive health recommendations were provided to patient.  See attached scanned questionnaire for additional information.   Signed,  Fabio Neighbors, LPN Nurse Health Advisor   Nurse Recommendations: None.

## 2018-09-08 NOTE — Patient Instructions (Signed)
Edward Mccoy , Thank you for taking time to come for your Medicare Wellness Visit. I appreciate your ongoing commitment to your health goals. Please review the following plan we discussed and let me know if I can assist you in the future.   Screening recommendations/referrals: Colonoscopy: Up to date, due 11/2021 Recommended yearly ophthalmology/optometry visit for glaucoma screening and checkup Recommended yearly dental visit for hygiene and checkup  Vaccinations: Influenza vaccine: Up to date Pneumococcal vaccine: Completed series Tdap vaccine: Up to date, due 07/2021 Shingles vaccine: Pt declines today.     Advanced directives: Living Will on file.   Conditions/risks identified: Continue to drink 6-8 8 oz glasses a day.   Next appointment: 10/11/18 @ 2:00 PM with Dr Rosanna Randy.   Preventive Care 69 Years and Older, Male Preventive care refers to lifestyle choices and visits with your health care provider that can promote health and wellness. What does preventive care include?  A yearly physical exam. This is also called an annual well check.  Dental exams once or twice a year.  Routine eye exams. Ask your health care provider how often you should have your eyes checked.  Personal lifestyle choices, including:  Daily care of your teeth and gums.  Regular physical activity.  Eating a healthy diet.  Avoiding tobacco and drug use.  Limiting alcohol use.  Practicing safe sex.  Taking low doses of aspirin every day.  Taking vitamin and mineral supplements as recommended by your health care provider. What happens during an annual well check? The services and screenings done by your health care provider during your annual well check will depend on your age, overall health, lifestyle risk factors, and family history of disease. Counseling  Your health care provider may ask you questions about your:  Alcohol use.  Tobacco use.  Drug use.  Emotional well-being.  Home and  relationship well-being.  Sexual activity.  Eating habits.  History of falls.  Memory and ability to understand (cognition).  Work and work Statistician. Screening  You may have the following tests or measurements:  Height, weight, and BMI.  Blood pressure.  Lipid and cholesterol levels. These may be checked every 5 years, or more frequently if you are over 11 years old.  Skin check.  Lung cancer screening. You may have this screening every year starting at age 69 if you have a 30-pack-year history of smoking and currently smoke or have quit within the past 69 years.  Fecal occult blood test (FOBT) of the stool. You may have this test every year starting at age 69.  Flexible sigmoidoscopy or colonoscopy. You may have a sigmoidoscopy every 5 years or a colonoscopy every 10 years starting at age 69.  Prostate cancer screening. Recommendations will vary depending on your family history and other risks.  Hepatitis C blood test.  Hepatitis B blood test.  Sexually transmitted disease (STD) testing.  Diabetes screening. This is done by checking your blood sugar (glucose) after you have not eaten for a while (fasting). You may have this done every 1-3 years.  Abdominal aortic aneurysm (AAA) screening. You may need this if you are a current or former smoker.  Osteoporosis. You may be screened starting at age 69 if you are at high risk. Talk with your health care provider about your test results, treatment options, and if necessary, the need for more tests. Vaccines  Your health care provider may recommend certain vaccines, such as:  Influenza vaccine. This is recommended every year.  Tetanus,  diphtheria, and acellular pertussis (Tdap, Td) vaccine. You may need a Td booster every 10 years.  Zoster vaccine. You may need this after age 69.  Pneumococcal 13-valent conjugate (PCV13) vaccine. One dose is recommended after age 69.  Pneumococcal polysaccharide (PPSV23) vaccine. One  dose is recommended after age 69. Talk to your health care provider about which screenings and vaccines you need and how often you need them. This information is not intended to replace advice given to you by your health care provider. Make sure you discuss any questions you have with your health care provider. Document Released: 08/17/2015 Document Revised: 04/09/2016 Document Reviewed: 05/22/2015 Elsevier Interactive Patient Education  2017 [employer] Prevention in the Home Falls can cause injuries. They can happen to people of all ages. There are many things you can do to make your home safe and to help prevent falls. What can I do on the outside of my home?  Regularly fix the edges of walkways and driveways and fix any cracks.  Remove anything that might make you trip as you walk through a door, such as a raised step or threshold.  Trim any bushes or trees on the path to your home.  Use bright outdoor lighting.  Clear any walking paths of anything that might make someone trip, such as rocks or tools.  Regularly check to see if handrails are loose or broken. Make sure that both sides of any steps have handrails.  Any raised decks and porches should have guardrails on the edges.  Have any leaves, snow, or ice cleared regularly.  Use sand or salt on walking paths during winter.  Clean up any spills in your garage right away. This includes oil or grease spills. What can I do in the bathroom?  Use night lights.  Install grab bars by the toilet and in the tub and shower. Do not use towel bars as grab bars.  Use non-skid mats or decals in the tub or shower.  If you need to sit down in the shower, use a plastic, non-slip stool.  Keep the floor dry. Clean up any water that spills on the floor as soon as it happens.  Remove soap buildup in the tub or shower regularly.  Attach bath mats securely with double-sided non-slip rug tape.  Do not have throw rugs and other  things on the floor that can make you trip. What can I do in the bedroom?  Use night lights.  Make sure that you have a light by your bed that is easy to reach.  Do not use any sheets or blankets that are too big for your bed. They should not hang down onto the floor.  Have a firm chair that has side arms. You can use this for support while you get dressed.  Do not have throw rugs and other things on the floor that can make you trip. What can I do in the kitchen?  Clean up any spills right away.  Avoid walking on wet floors.  Keep items that you use a lot in easy-to-reach places.  If you need to reach something above you, use a strong step stool that has a grab bar.  Keep electrical cords out of the way.  Do not use floor polish or wax that makes floors slippery. If you must use wax, use non-skid floor wax.  Do not have throw rugs and other things on the floor that can make you trip. What can I do with  my stairs?  Do not leave any items on the stairs.  Make sure that there are handrails on both sides of the stairs and use them. Fix handrails that are broken or loose. Make sure that handrails are as long as the stairways.  Check any carpeting to make sure that it is firmly attached to the stairs. Fix any carpet that is loose or worn.  Avoid having throw rugs at the top or bottom of the stairs. If you do have throw rugs, attach them to the floor with carpet tape.  Make sure that you have a light switch at the top of the stairs and the bottom of the stairs. If you do not have them, ask someone to add them for you. What else can I do to help prevent falls?  Wear shoes that:  Do not have high heels.  Have rubber bottoms.  Are comfortable and fit you well.  Are closed at the toe. Do not wear sandals.  If you use a stepladder:  Make sure that it is fully opened. Do not climb a closed stepladder.  Make sure that both sides of the stepladder are locked into place.  Ask  someone to hold it for you, if possible.  Clearly mark and make sure that you can see:  Any grab bars or handrails.  First and last steps.  Where the edge of each step is.  Use tools that help you move around (mobility aids) if they are needed. These include:  Canes.  Walkers.  Scooters.  Crutches.  Turn on the lights when you go into a dark area. Replace any light bulbs as soon as they burn out.  Set up your furniture so you have a clear path. Avoid moving your furniture around.  If any of your floors are uneven, fix them.  If there are any pets around you, be aware of where they are.  Review your medicines with your doctor. Some medicines can make you feel dizzy. This can increase your chance of falling. Ask your doctor what other things that you can do to help prevent falls. This information is not intended to replace advice given to you by your health care provider. Make sure you discuss any questions you have with your health care provider. Document Released: 05/17/2009 Document Revised: 12/27/2015 Document Reviewed: 08/25/2014 Elsevier Interactive Patient Education  2017 [employer].

## 2018-10-11 ENCOUNTER — Encounter: Payer: PPO | Admitting: Family Medicine

## 2018-10-12 ENCOUNTER — Telehealth: Payer: Self-pay | Admitting: Urology

## 2018-10-12 MED ORDER — SILDENAFIL CITRATE 20 MG PO TABS: ORAL_TABLET | ORAL | 0 refills | Status: DC

## 2018-10-12 NOTE — Telephone Encounter (Signed)
Sildenafil refill sent to [facility]

## 2018-10-12 NOTE — Telephone Encounter (Signed)
Pt's wife called and states pt needs refill on Sildenafil.

## 2018-10-12 NOTE — Addendum Note (Signed)
Addended by: Feliberto Gottron on: 10/12/2018 10:39 AM   Modules accepted: Orders

## 2018-11-04 ENCOUNTER — Encounter: Payer: PPO | Admitting: Family Medicine

## 2018-12-16 ENCOUNTER — Other Ambulatory Visit: Payer: Self-pay | Admitting: Family Medicine

## 2018-12-16 DIAGNOSIS — E7800 Pure hypercholesterolemia, unspecified: Secondary | ICD-10-CM

## 2019-01-13 ENCOUNTER — Other Ambulatory Visit: Payer: Self-pay | Admitting: Urology

## 2019-01-13 MED ORDER — SILDENAFIL CITRATE 20 MG PO TABS: ORAL_TABLET | ORAL | 0 refills | Status: DC

## 2019-01-13 NOTE — Telephone Encounter (Signed)
Patient walked in today asking for a refill on Sildenafil 90 day supply to be sent [facility] [place].  Please advise  Sharyn Lull

## 2019-01-13 NOTE — Telephone Encounter (Signed)
RX sent

## 2019-01-17 ENCOUNTER — Other Ambulatory Visit: Payer: Self-pay | Admitting: Family Medicine

## 2019-02-25 ENCOUNTER — Other Ambulatory Visit: Payer: Self-pay

## 2019-02-28 ENCOUNTER — Other Ambulatory Visit: Payer: Self-pay

## 2019-02-28 ENCOUNTER — Inpatient Hospital Stay: Payer: PPO | Attending: Internal Medicine

## 2019-02-28 DIAGNOSIS — Z79899 Other long term (current) drug therapy: Secondary | ICD-10-CM | POA: Diagnosis not present

## 2019-02-28 DIAGNOSIS — Z8589 Personal history of malignant neoplasm of other organs and systems: Secondary | ICD-10-CM | POA: Diagnosis not present

## 2019-02-28 DIAGNOSIS — Z8614 Personal history of Methicillin resistant Staphylococcus aureus infection: Secondary | ICD-10-CM | POA: Diagnosis not present

## 2019-02-28 DIAGNOSIS — Z809 Family history of malignant neoplasm, unspecified: Secondary | ICD-10-CM | POA: Diagnosis not present

## 2019-02-28 DIAGNOSIS — Z86010 Personal history of colonic polyps: Secondary | ICD-10-CM | POA: Insufficient documentation

## 2019-02-28 DIAGNOSIS — R140 Abdominal distension (gaseous): Secondary | ICD-10-CM | POA: Diagnosis not present

## 2019-02-28 DIAGNOSIS — Z7982 Long term (current) use of aspirin: Secondary | ICD-10-CM | POA: Diagnosis not present

## 2019-02-28 DIAGNOSIS — Z8546 Personal history of malignant neoplasm of prostate: Secondary | ICD-10-CM | POA: Diagnosis not present

## 2019-02-28 DIAGNOSIS — C181 Malignant neoplasm of appendix: Secondary | ICD-10-CM

## 2019-02-28 LAB — CBC WITH DIFFERENTIAL/PLATELET
Abs Immature Granulocytes: 0.01 10*3/uL (ref 0.00–0.07)
Basophils Absolute: 0 10*3/uL (ref 0.0–0.1)
Basophils Relative: 0 %
Eosinophils Absolute: 0.4 10*3/uL (ref 0.0–0.5)
Eosinophils Relative: 7 %
HCT: 40.5 % (ref 39.0–52.0)
Hemoglobin: 13.7 g/dL (ref 13.0–17.0)
Immature Granulocytes: 0 %
Lymphocytes Relative: 28 %
Lymphs Abs: 1.4 10*3/uL (ref 0.7–4.0)
MCH: 31.7 pg (ref 26.0–34.0)
MCHC: 33.8 g/dL (ref 30.0–36.0)
MCV: 93.8 fL (ref 80.0–100.0)
Monocytes Absolute: 0.5 10*3/uL (ref 0.1–1.0)
Monocytes Relative: 10 %
Neutro Abs: 2.7 10*3/uL (ref 1.7–7.7)
Neutrophils Relative %: 55 %
Platelets: 176 10*3/uL (ref 150–400)
RBC: 4.32 MIL/uL (ref 4.22–5.81)
RDW: 11.9 % (ref 11.5–15.5)
WBC: 5.1 10*3/uL (ref 4.0–10.5)
nRBC: 0 % (ref 0.0–0.2)

## 2019-02-28 LAB — COMPREHENSIVE METABOLIC PANEL
ALT: 36 U/L (ref 0–44)
AST: 33 U/L (ref 15–41)
Albumin: 4.4 g/dL (ref 3.5–5.0)
Alkaline Phosphatase: 27 U/L — ABNORMAL LOW (ref 38–126)
Anion gap: 7 (ref 5–15)
BUN: 28 mg/dL — ABNORMAL HIGH (ref 8–23)
CO2: 26 mmol/L (ref 22–32)
Calcium: 9.1 mg/dL (ref 8.9–10.3)
Chloride: 108 mmol/L (ref 98–111)
Creatinine, Ser: 0.87 mg/dL (ref 0.61–1.24)
GFR calc Af Amer: >60 mL/min (ref >60)
GFR calc non Af Amer: >60 mL/min (ref >60)
Glucose, Bld: 108 mg/dL — ABNORMAL HIGH (ref 70–99)
Potassium: 4.5 mmol/L (ref 3.5–5.1)
Sodium: 141 mmol/L (ref 135–145)
Total Bilirubin: 1.3 mg/dL — ABNORMAL HIGH (ref 0.3–1.2)
Total Protein: 6.8 g/dL (ref 6.5–8.1)

## 2019-03-01 ENCOUNTER — Other Ambulatory Visit: Payer: Self-pay

## 2019-03-01 LAB — CEA: CEA: 1.2 ng/mL (ref 0.0–4.7)

## 2019-03-02 ENCOUNTER — Other Ambulatory Visit: Payer: Self-pay

## 2019-03-02 ENCOUNTER — Inpatient Hospital Stay (HOSPITAL_BASED_OUTPATIENT_CLINIC_OR_DEPARTMENT_OTHER): Payer: PPO | Admitting: Internal Medicine

## 2019-03-02 DIAGNOSIS — Z7982 Long term (current) use of aspirin: Secondary | ICD-10-CM | POA: Diagnosis not present

## 2019-03-02 DIAGNOSIS — Z809 Family history of malignant neoplasm, unspecified: Secondary | ICD-10-CM | POA: Diagnosis not present

## 2019-03-02 DIAGNOSIS — Z8614 Personal history of Methicillin resistant Staphylococcus aureus infection: Secondary | ICD-10-CM | POA: Diagnosis not present

## 2019-03-02 DIAGNOSIS — Z8589 Personal history of malignant neoplasm of other organs and systems: Secondary | ICD-10-CM | POA: Diagnosis not present

## 2019-03-02 DIAGNOSIS — R140 Abdominal distension (gaseous): Secondary | ICD-10-CM

## 2019-03-02 DIAGNOSIS — Z86010 Personal history of colonic polyps: Secondary | ICD-10-CM

## 2019-03-02 DIAGNOSIS — Z8546 Personal history of malignant neoplasm of prostate: Secondary | ICD-10-CM | POA: Diagnosis not present

## 2019-03-02 DIAGNOSIS — Z79899 Other long term (current) drug therapy: Secondary | ICD-10-CM | POA: Diagnosis not present

## 2019-03-02 DIAGNOSIS — C181 Malignant neoplasm of appendix: Secondary | ICD-10-CM

## 2019-03-02 NOTE — Progress Notes (Signed)
[place] OFFICE PROGRESS NOTE  Patient Care Team: [name]., MD as PCP - General (Family Medicine) [name], MD as Consulting Physician (Internal Medicine) [name], MD as Consulting Physician (Urology)  Cancer Staging No matching staging information was found for the patient.   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  Dr.Sankar] last CT March 2018- NED [discussed not doing further surveillance scans]  # colo [Dr.sankar April 2018- repeat colo in 5 years]  # Prostate cancer 2008 s/p Prostatectomy [Dr.Stoiff].    Malignant neoplasm of appendix [facility])      INTERVAL HISTORY:  Edward Mccoy 68 y.o.  male pleasant patient above history of carcinoma of the appendix stage I and history of prostate cancer is here for follow-up.  Patient denies any blood in stools or black or stools.  Appetite is good.  No weight loss.  Less abdominal distention.  No bone pain.  Continues to work/physically active.  Review of Systems  Constitutional: Negative for chills, diaphoresis, fever, malaise/fatigue and weight loss.  HENT: Negative for nosebleeds and sore throat.   Eyes: Negative for double vision.  Respiratory: Negative for cough, hemoptysis, sputum production, shortness of breath and wheezing.   Cardiovascular: Negative for chest pain, palpitations, orthopnea and leg swelling.  Gastrointestinal: Negative for abdominal pain, blood in stool, constipation, diarrhea, heartburn, melena, nausea and vomiting.  Genitourinary: Negative for dysuria, frequency and urgency.  Musculoskeletal: Negative for back pain and joint pain.  Skin: Negative.  Negative for itching and rash.  Neurological: Negative for dizziness, tingling, focal weakness, weakness and headaches.  Endo/Heme/Allergies: Does not  bruise/bleed easily.  Psychiatric/Behavioral: Negative for depression. The patient is not nervous/anxious and does not have insomnia.       PAST MEDICAL HISTORY :  Past Medical History:  Diagnosis Date  . Cancer [facility]) 2008   prostate  . Cancer of appendix ([place]) 11/15/14   high-grade appendiceal mucinous neoplasm  . Colon polyp   . MRSA infection 05/2015   Right [place]ne[place]    PAST SURGICAL HISTORY :   Past Surgical History:  Procedure Laterality Date  . APPENDECTOMY  11/15/14   patient report it was cancerous   . COLONOSCOPY  07-09-06   Dr Allen Norris  . COLONOSCOPY WITH PROPOFOL N/A 11/12/2016   Procedure: COLONOSCOPY WITH PROPOFOL;  Surgeon: Christene Lye, MD;  Location: [facility] ENDOSCOPY;  Service: Endoscopy;  Laterality: N/A;  . PROSTATE SURGERY  June 2008  . SHOULDER SURGERY  Nov 1999, Oct 2015    FAMILY HISTORY :   Family History  Problem Relation Age of Onset  . Cancer Mother        unknown type of ca  . Diabetes Father   . Parkinson's disease Father     SOCIAL HISTORY:   Social History   Tobacco Use  . Smoking status: Never Smoker  . Smokeless tobacco: Never Used  Substance Use Topics  . Alcohol use: Yes    Alcohol/week: 0.0 standard drinks    Comment: occasionally- beer  . Drug use: No    ALLERGIES:  has No Known Allergies.  MEDICATIONS:  Current Outpatient Medications  Medication Sig Dispense Refill  . Ascorbic Acid (VITAMIN C) 1000 MG tablet Take 1,000 mg by mouth daily.    Marland Kitchen aspirin EC 81 MG tablet Take 81 mg by mouth daily.    . celecoxib (  CELEBREX) 200 MG capsule TAKE 1 CAPSULE BY MOUTH EVERY DAY AS NEEDED 90 capsule 1  . Multiple Vitamins-Minerals (MULTIVITAMIN ADULT PO) Take by mouth daily.    . Omega-3 Fatty Acids (FISH OIL) 1200 MG CAPS Take by mouth daily.     . sildenafil (REVATIO) 20 MG tablet 2-5 tabs 1 hour prior to intercourse 90 tablet 0  . simvastatin (ZOCOR) 10 MG tablet TAKE 1 TABLET BY MOUTH EVERYDAY AT BEDTIME 90 tablet 3   No current  facility-administered medications for this visit.     PHYSICAL EXAMINATION: ECOG PERFORMANCE STATUS: 0 - Asymptomatic  BP 115/72   Pulse (!) 54   Temp 98.7 F (37.1 C)   Resp 18   Wt 181 lb 9.6 oz (82.4 kg)   BMI 26.06 kg/m   Filed Weights   03/02/19 1444  Weight: 181 lb 9.6 oz (82.4 kg)    Physical Exam  Constitutional: He is oriented to person, place, and time and well-developed, well-nourished, and in no distress.  HENT:  Head: Normocephalic and atraumatic.  Mouth/Throat: Oropharynx is clear and moist. No oropharyngeal exudate.  Eyes: Pupils are equal, round, and reactive to light.  Neck: Normal range of motion. Neck supple.  Cardiovascular: Normal rate and regular rhythm.  Pulmonary/Chest: Effort normal and breath sounds normal. No respiratory distress. He has no wheezes.  Abdominal: Soft. Bowel sounds are normal. He exhibits no distension and no mass. There is no abdominal tenderness. There is no rebound and no guarding.  Musculoskeletal: Normal range of motion.        General: No tenderness or edema.  Neurological: He is alert and oriented to person, place, and time.  Skin: Skin is warm.  Psychiatric: Affect normal.    LABORATORY DATA:  I have reviewed the data as listed    Component Value Date/Time   NA 141 02/28/2019 0803   NA 142 09/08/2017 1122   NA 139 11/13/2014 1553   K 4.5 02/28/2019 0803   K 3.9 11/13/2014 1553   CL 108 02/28/2019 0803   CL 104 11/13/2014 1553   CO2 26 02/28/2019 0803   CO2 28 11/13/2014 1553   GLUCOSE 108 (H) 02/28/2019 0803   GLUCOSE 93 11/13/2014 1553   BUN 28 (H) 02/28/2019 0803   BUN 16 09/08/2017 1122   BUN 18 11/13/2014 1553   CREATININE 0.87 02/28/2019 0803   CREATININE 1.02 11/13/2014 1553   CALCIUM 9.1 02/28/2019 0803   CALCIUM 9.1 11/13/2014 1553   PROT 6.8 02/28/2019 0803   PROT 6.5 09/08/2017 1122   PROT 7.3 11/13/2014 1553   ALBUMIN 4.4 02/28/2019 0803   ALBUMIN 4.3 09/08/2017 1122   ALBUMIN 4.6 11/13/2014  1553   AST 33 02/28/2019 0803   AST 30 11/13/2014 1553   ALT 36 02/28/2019 0803   ALT 34 11/13/2014 1553   ALKPHOS 27 (L) 02/28/2019 0803   ALKPHOS 39 11/13/2014 1553   BILITOT 1.3 (H) 02/28/2019 0803   BILITOT 0.8 09/08/2017 1122   BILITOT 0.9 11/13/2014 1553   GFRNONAA >60 02/28/2019 0803   GFRNONAA >60 11/13/2014 1553   GFRAA >60 02/28/2019 0803   GFRAA >60 11/13/2014 1553    No results found for: SPEP, UPEP  Lab Results  Component Value Date   WBC 5.1 02/28/2019   NEUTROABS 2.7 02/28/2019   HGB 13.7 02/28/2019   HCT 40.5 02/28/2019   MCV 93.8 02/28/2019   PLT 176 02/28/2019      Chemistry      Component  Value Date/Time   NA 141 02/28/2019 0803   NA 142 09/08/2017 1122   NA 139 11/13/2014 1553   K 4.5 02/28/2019 0803   K 3.9 11/13/2014 1553   CL 108 02/28/2019 0803   CL 104 11/13/2014 1553   CO2 26 02/28/2019 0803   CO2 28 11/13/2014 1553   BUN 28 (H) 02/28/2019 0803   BUN 16 09/08/2017 1122   BUN 18 11/13/2014 1553   CREATININE 0.87 02/28/2019 0803   CREATININE 1.02 11/13/2014 1553   GLU 102 08/10/2014      Component Value Date/Time   CALCIUM 9.1 02/28/2019 0803   CALCIUM 9.1 11/13/2014 1553   ALKPHOS 27 (L) 02/28/2019 0803   ALKPHOS 39 11/13/2014 1553   AST 33 02/28/2019 0803   AST 30 11/13/2014 1553   ALT 36 02/28/2019 0803   ALT 34 11/13/2014 1553   BILITOT 1.3 (H) 02/28/2019 0803   BILITOT 0.8 09/08/2017 1122   BILITOT 0.9 11/13/2014 1553       RADIOGRAPHIC STUDIES: I have personally reviewed the radiological images as listed and agreed with the findings in the report. No results found.   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 Colonoscopy- 2018- wnl; repeat again in 2023.  Stable.  # Prostate cancer-clinically no evidence of recurrence; PSA-February 2019 -undetectable.  Continue follow-up with urology  [Dr.Stoiff].  Stable.  # DISPOSITION:  # Follow-up in 12 months- MD with labs-cbc/cmp/CEA- Dr.B    Orders Placed This Encounter  Procedures  . CBC with Differential    Standing Status:   Future    Standing Expiration Date:   09/01/2020  . Comprehensive metabolic panel    Standing Status:   Future    Standing Expiration Date:   09/01/2020  . CEA    Standing Status:   Future    Standing Expiration Date:   09/01/2020   All questions were answered. The patient knows to call the clinic with any problems, questions or concerns.      [name], MD 03/04/2019 12:56 PM

## 2019-03-02 NOTE — Progress Notes (Signed)
Patient does not offer any problems today.  

## 2019-03-02 NOTE — Assessment & Plan Note (Addendum)
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 Colonoscopy- 2018- wnl; repeat again in 2023.  Stable.  # Prostate cancer-clinically no evidence of recurrence; PSA-February 2019 -undetectable.  Continue follow-up with urology [Dr.Stoiff].  Stable.  # DISPOSITION:  # Follow-up in 12 months- MD with labs-cbc/cmp/CEA- Dr.B

## 2019-03-17 ENCOUNTER — Encounter: Payer: PPO | Admitting: Family Medicine

## 2019-03-31 DIAGNOSIS — L821 Other seborrheic keratosis: Secondary | ICD-10-CM | POA: Diagnosis not present

## 2019-03-31 DIAGNOSIS — Z85828 Personal history of other malignant neoplasm of skin: Secondary | ICD-10-CM | POA: Diagnosis not present

## 2019-03-31 DIAGNOSIS — D225 Melanocytic nevi of trunk: Secondary | ICD-10-CM | POA: Diagnosis not present

## 2019-03-31 DIAGNOSIS — Z08 Encounter for follow-up examination after completed treatment for malignant neoplasm: Secondary | ICD-10-CM | POA: Diagnosis not present

## 2019-03-31 DIAGNOSIS — D485 Neoplasm of uncertain behavior of skin: Secondary | ICD-10-CM | POA: Diagnosis not present

## 2019-03-31 DIAGNOSIS — X32XXXA Exposure to sunlight, initial encounter: Secondary | ICD-10-CM | POA: Diagnosis not present

## 2019-03-31 DIAGNOSIS — C44519 Basal cell carcinoma of skin of other part of trunk: Secondary | ICD-10-CM | POA: Diagnosis not present

## 2019-03-31 DIAGNOSIS — L570 Actinic keratosis: Secondary | ICD-10-CM | POA: Diagnosis not present

## 2019-04-04 ENCOUNTER — Encounter: Payer: Self-pay | Admitting: Family Medicine

## 2019-04-04 ENCOUNTER — Ambulatory Visit (INDEPENDENT_AMBULATORY_CARE_PROVIDER_SITE_OTHER): Payer: PPO | Admitting: Family Medicine

## 2019-04-04 ENCOUNTER — Other Ambulatory Visit: Payer: Self-pay

## 2019-04-04 VITALS — Temp 99.6°F

## 2019-04-04 DIAGNOSIS — R509 Fever, unspecified: Secondary | ICD-10-CM | POA: Diagnosis not present

## 2019-04-04 DIAGNOSIS — R197 Diarrhea, unspecified: Secondary | ICD-10-CM

## 2019-04-04 DIAGNOSIS — Z20822 Contact with and (suspected) exposure to covid-19: Secondary | ICD-10-CM

## 2019-04-04 DIAGNOSIS — R6889 Other general symptoms and signs: Secondary | ICD-10-CM | POA: Diagnosis not present

## 2019-04-04 NOTE — Patient Instructions (Signed)
.   Please review the attached list of medications and notify my office if there are any errors.   . Please bring all of your medications to every appointment so we can make sure that our medication list is the same as yours.   . It is especially important to get the annual flu vaccine this year. If you haven't had it already, please go to your pharmacy or call the office as soon as possible to schedule you flu shot.  

## 2019-04-04 NOTE — Progress Notes (Signed)
Patient: Edward Mccoy Male    DOB: 09-23-1950   68 y.o.   MRN: [id number] Visit Date: 04/04/2019  Today's Provider: Lelon Huh, MD   Chief Complaint  Patient presents with   Fever    x 1 day   Subjective:    Virtual Visit via Telephone Note  I connected with Edward Mccoy on 04/04/19 at 10:40 AM EDT by telephone and verified that I am speaking with the correct person using two identifiers.  Location: Patient: Home Provider: [facility]   I discussed the limitations, risks, security and privacy concerns of performing an evaluation and management service by telephone and the availability of in person appointments. I also discussed with the patient that there may be a patient responsible charge related to this service. The patient expressed understanding and agreed to proceed.   Fever  This is a new problem. The current episode started yesterday. The problem has been gradually improving. The temperature was taken using an oral thermometer (up to 100.4 last night). Associated symptoms include diarrhea. Pertinent negatives include no abdominal pain, chest pain, congestion, coughing, ear pain, headaches, muscle aches, nausea, rash, sleepiness, sore throat, urinary pain, vomiting or wheezing. He has tried nothing for the symptoms.   Grand daughter coming   No Known Allergies   Current Outpatient Medications:    Ascorbic Acid (VITAMIN C) 1000 MG tablet, Take 1,000 mg by mouth daily., Disp: , Rfl:    aspirin EC 81 MG tablet, Take 81 mg by mouth daily., Disp: , Rfl:    celecoxib (CELEBREX) 200 MG capsule, TAKE 1 CAPSULE BY MOUTH EVERY DAY AS NEEDED, Disp: 90 capsule, Rfl: 1   Multiple Vitamins-Minerals (MULTIVITAMIN ADULT PO), Take by mouth daily., Disp: , Rfl:    Omega-3 Fatty Acids (FISH OIL) 1200 MG CAPS, Take by mouth daily. , Disp: , Rfl:    sildenafil (REVATIO) 20 MG tablet, 2-5 tabs 1 hour prior to intercourse, Disp: 90 tablet, Rfl: 0   simvastatin (ZOCOR) 10 MG tablet,  TAKE 1 TABLET BY MOUTH EVERYDAY AT BEDTIME, Disp: 90 tablet, Rfl: 3  Review of Systems  Constitutional: Positive for fever. Negative for appetite change and chills.  HENT: Negative for congestion, ear pain and sore throat.   Respiratory: Negative for cough, chest tightness, shortness of breath and wheezing.   Cardiovascular: Negative for chest pain and palpitations.  Gastrointestinal: Positive for diarrhea. Negative for abdominal pain, nausea and vomiting.  Genitourinary: Negative for dysuria.  Skin: Negative for rash.  Neurological: Negative for headaches.    Social History   Tobacco Use   Smoking status: Never Smoker   Smokeless tobacco: Never Used  Substance Use Topics   Alcohol use: Yes    Alcohol/week: 0.0 standard drinks    Comment: occasionally- beer      Objective:   Temp 99.6 F (37.6 C) (Oral)  Vitals:   04/04/19 1026  Temp: 99.6 F (37.6 C)  TempSrc: Oral  There is no height or weight on file to calculate BMI.   Physical Exam  By phone is awake, alert, oriented, sounds well      Assessment & Plan     1. Fever, unspecified fever cause   2. Diarrhea, unspecified type  Not in any acute distress. Ordered coronavirus. He is anticipating taking care of GrandDaughter next week if Covid test is negative. His wife is Edward Mccoy, advised would test her if his test came back positive or if she develops any sx.  I discussed the assessment and treatment plan with the patient. The patient was provided an opportunity to ask questions and all were answered. The patient agreed with the plan and demonstrated an understanding of the instructions.   The patient was advised to call back or seek an in-person evaluation if the symptoms worsen or if the condition fails to improve as anticipated.  I provided 8 minutes of non-face-to-face time during this encounter.    Lelon Huh, MD  [facility]

## 2019-04-06 LAB — NOVEL CORONAVIRUS, NAA: SARS-CoV-2, NAA: NOT DETECTED

## 2019-04-13 ENCOUNTER — Ambulatory Visit: Payer: PPO | Admitting: Urology

## 2019-04-13 ENCOUNTER — Encounter: Payer: Self-pay | Admitting: Urology

## 2019-04-13 ENCOUNTER — Other Ambulatory Visit: Payer: Self-pay

## 2019-04-13 VITALS — BP 117/70 | HR 52 | Ht 70.0 in | Wt 186.0 lb

## 2019-04-13 DIAGNOSIS — N5231 Erectile dysfunction following radical prostatectomy: Secondary | ICD-10-CM

## 2019-04-13 DIAGNOSIS — Z8546 Personal history of malignant neoplasm of prostate: Secondary | ICD-10-CM

## 2019-04-15 ENCOUNTER — Telehealth: Payer: Self-pay

## 2019-04-15 ENCOUNTER — Encounter: Payer: Self-pay | Admitting: Urology

## 2019-04-15 DIAGNOSIS — Z8546 Personal history of malignant neoplasm of prostate: Secondary | ICD-10-CM | POA: Insufficient documentation

## 2019-04-15 MED ORDER — SILDENAFIL CITRATE 20 MG PO TABS: ORAL_TABLET | ORAL | 1 refills | Status: DC

## 2019-04-15 NOTE — Telephone Encounter (Signed)
Per [facility] staff message ok to refill pt's Trimix 75/2.5/25 32ml vial. Called pharmacy left voicemail on provider line with the information above.

## 2019-04-15 NOTE — Progress Notes (Signed)
04/13/2019 6:59 AM   [name] 1951-02-07 [id number]  Referring provider: [name]., MD [address] [place],  [place] [address]  Chief Complaint  Patient presents with  . Erectile Dysfunction  . Prostate Cancer    Urologic history: 1. pT2c Gleason 3+4 adenocarcinoma the prostate - RALP 09/2006; PSA remains undetectable  2.  Post prostatectomy erectile dysfunction -Doing well on Trimix   HPI: 68 y.o. male presents for annual follow-up.  He states he is doing very well.  He has no bothersome lower urinary tract symptoms.  Denies dysuria or gross hematuria.  He has no flank, abdominal, pelvic or scrotal pain.   PMH: Past Medical History:  Diagnosis Date  . Cancer [facility]) 2008   prostate  . 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 Colon polyp   . ED (erectile dysfunction)   . MRSA infection 05/2015   Right [place]    Surgical History: Past Surgical History:  Procedure Laterality Date  . APPENDECTOMY  11/15/14   patient report it was cancerous   . COLONOSCOPY  07-09-06   Dr Allen Norris  . COLONOSCOPY WITH PROPOFOL N/A 11/12/2016   Procedure: COLONOSCOPY WITH PROPOFOL;  Surgeon: Christene Lye, MD;  Location: [facility] ENDOSCOPY;  Service: Endoscopy;  Laterality: N/A;  . PROSTATE SURGERY  June 2008  . SHOULDER SURGERY  Nov 1999, Oct 2015    Home Medications:  Allergies as of 04/13/2019   No Known Allergies     Medication List       Accurate as of April 13, 2019 11:59 PM. If you have any questions, ask your nurse or doctor.        aspirin EC 81 MG tablet Take 81 mg by mouth daily.   celecoxib 200 MG capsule Commonly known as: CELEBREX TAKE 1 CAPSULE BY MOUTH EVERY DAY AS NEEDED   Fish Oil 1200 MG Caps Take by mouth daily.   MULTIVITAMIN ADULT PO Take by mouth daily.   sildenafil 20 MG tablet Commonly known as: REVATIO 2-5 tabs 1 hour prior to intercourse   simvastatin 10 MG tablet  Commonly known as: ZOCOR TAKE 1 TABLET BY MOUTH EVERYDAY AT BEDTIME   vitamin C 1000 MG tablet Take 1,000 mg by mouth daily.       Allergies: No Known Allergies  Family History: Family History  Problem Relation Age of Onset  . Cancer Mother        unknown type of ca  . Diabetes Father   . Parkinson's disease Father     Social History:  reports that he has never smoked. He has never used smokeless tobacco. He reports current alcohol use. He reports that he does not use drugs.  ROS: UROLOGY Frequent Urination?: No Hard to postpone urination?: No Burning/pain with urination?: No Get up at night to urinate?: No Leakage of urine?: No Urine stream starts and stops?: No Trouble starting stream?: No Do you have to strain to urinate?: No Blood in urine?: No Urinary tract infection?: No Sexually transmitted disease?: No Injury to kidneys or bladder?: No Painful intercourse?: No Weak stream?: No Erection problems?: No Penile pain?: No  Gastrointestinal Nausea?: No Vomiting?: No Indigestion/heartburn?: No Diarrhea?: No Constipation?: No  Constitutional Fever: No Night sweats?: No Weight loss?: No Fatigue?: No  Skin Skin rash/lesions?: No Itching?: No  Eyes Blurred vision?: No Double vision?: No  Ears/Nose/Throat Sore throat?: No Sinus problems?: No  Hematologic/Lymphatic Swollen glands?: No Easy  bruising?: No  Cardiovascular Leg swelling?: No Chest pain?: No  Respiratory Cough?: No Shortness of breath?: No  Endocrine Excessive thirst?: No  Musculoskeletal Back pain?: No Joint pain?: No  Neurological Headaches?: No Dizziness?: No  Psychologic Depression?: No Anxiety?: No  Physical Exam: BP 117/70 (BP Location: Left Arm, Patient Position: Sitting, Cuff Size: Normal)   Pulse (!) 52   Ht 5\' 10"  (1.778 m)   Wt 186 lb (84.4 kg)   BMI 26.69 kg/m   Constitutional:  Alert and oriented, No acute distress. HEENT: [place] AT, moist mucus  membranes.  Trachea midline, no masses. Cardiovascular: No clubbing, cyanosis, or edema. Respiratory: Normal respiratory effort, no increased work of breathing. GI: Abdomen is soft, nontender, nondistended, no abdominal masses GU: No CVA tenderness Lymph: No cervical or inguinal lymphadenopathy. Skin: No rashes, bruises or suspicious lesions. Neurologic: Grossly intact, no focal deficits, moving all 4 extremities. Psychiatric: Normal mood and affect.   Assessment & Plan:    - History of prostate cancer PSA drawn today and if it remains undetectable he will continue annual follow-up.  - Erectile dysfunction Trimix refilled.  He also takes sildenafil which was refilled.   Return in about 1 year (around 04/12/2020) for Recheck.   [name], [place] [address] [place], [place] [address] [phone]

## 2019-04-15 NOTE — Telephone Encounter (Signed)
-----   Message from [name], MD sent at 04/15/2019  7:03 AM EDT ----- Please refill Trimix.  [facility] in [place].  He will pick up there.  [phone]

## 2019-04-18 ENCOUNTER — Ambulatory Visit: Payer: PPO | Admitting: Urology

## 2019-04-19 ENCOUNTER — Telehealth: Payer: Self-pay

## 2019-04-19 ENCOUNTER — Ambulatory Visit: Payer: PPO | Admitting: Urology

## 2019-04-19 NOTE — Telephone Encounter (Signed)
LMTCB covid screening

## 2019-04-20 ENCOUNTER — Ambulatory Visit (INDEPENDENT_AMBULATORY_CARE_PROVIDER_SITE_OTHER): Payer: PPO | Admitting: Family Medicine

## 2019-04-20 ENCOUNTER — Other Ambulatory Visit: Payer: Self-pay

## 2019-04-20 ENCOUNTER — Ambulatory Visit: Payer: PPO | Admitting: Urology

## 2019-04-20 VITALS — BP 129/72 | HR 49 | Temp 96.6°F | Resp 16 | Ht 70.0 in | Wt 181.0 lb

## 2019-04-20 DIAGNOSIS — Z0000 Encounter for general adult medical examination without abnormal findings: Secondary | ICD-10-CM

## 2019-04-20 DIAGNOSIS — Z8546 Personal history of malignant neoplasm of prostate: Secondary | ICD-10-CM | POA: Diagnosis not present

## 2019-04-20 DIAGNOSIS — E041 Nontoxic single thyroid nodule: Secondary | ICD-10-CM | POA: Diagnosis not present

## 2019-04-20 DIAGNOSIS — Z23 Encounter for immunization: Secondary | ICD-10-CM

## 2019-04-20 DIAGNOSIS — E785 Hyperlipidemia, unspecified: Secondary | ICD-10-CM

## 2019-04-20 DIAGNOSIS — C61 Malignant neoplasm of prostate: Secondary | ICD-10-CM

## 2019-04-20 DIAGNOSIS — R7303 Prediabetes: Secondary | ICD-10-CM

## 2019-04-20 NOTE — Progress Notes (Signed)
Patient: Edward Mccoy, Male    DOB: 1950/10/21, 68 y.o.   MRN: [id number] Visit Date: 04/20/2019  Today's Provider: [name], MD   Chief Complaint  Patient presents with  . Annual Exam   Subjective:  Edward Mccoy is a 68 y.o. male who presents today for health maintenance and complete physical. He feels well. He reports exercising runs 5 days per week. He reports he is sleeping well. He is married and retired. He no longer plays golf.  Immunization History  Administered Date(s) Administered  . Influenza, High Dose Seasonal PF 08/14/2016, 06/16/2017, 06/09/2018  . Influenza,inj,Quad PF,6+ Mos 08/09/2013, 08/14/2015  . Pneumococcal Conjugate-13 08/14/2015  . Pneumococcal Polysaccharide-23 06/14/2017, 09/07/2017  . Td 01/03/2005  . Tdap 07/24/2011  . Zoster 08/09/2013   11/12/16 Colonoscopy, [facility]-no abnormal polyps, repeat 5 years  Review of Systems  Constitutional: Negative.   HENT: Negative.   Eyes: Negative.   Respiratory: Negative.   Cardiovascular: Negative.   Gastrointestinal: Negative.   Endocrine: Negative.   Genitourinary: Negative.   Musculoskeletal: Negative.   Skin: Negative.   Allergic/Immunologic: Negative.   Neurological: Negative.   Hematological: Negative.   Psychiatric/Behavioral: Negative.     Social History   Socioeconomic History  . Marital status: Married    Spouse name: [name]  . Number of children: 1  . Years of education: 60  . Highest education level: Bachelor's degree (e.g., BA, AB, BS)  Occupational History  . 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 Financial resource strain: Not hard at all  . Food insecurity    Worry: Never true    Inability: Never true  . Transportation needs    Medical: No    Non-medical: No  Tobacco Use  . Smoking status: Never Smoker  . Smokeless tobacco: Never Used  Substance and Sexual Activity  . Alcohol use: Yes    Alcohol/week: 0.0 standard drinks    Comment: occasionally- beer  . Drug use:  No  . Sexual activity: Yes    Birth control/protection: None  Lifestyle  . Physical activity    Days per week: 5 days    Minutes per session: 30 min  . Stress: Not at all  Relationships  . Social Herbalist on phone: Patient refused    Gets together: Patient refused    Attends religious service: Patient refused    Active member of club or organization: Patient refused    Attends meetings of clubs or organizations: Patient refused    Relationship status: Patient refused  . Intimate partner violence    Fear of current or ex partner: No    Emotionally abused: No    Physically abused: No    Forced sexual activity: No  Other Topics Concern  . Not on file  Social History Narrative  . Not on file    Patient Active Problem List   Diagnosis Date Noted  . History of prostate cancer 04/15/2019  . Erectile dysfunction after radical prostatectomy 04/16/2018  . Prediabetes 09/08/2017  . HLD (hyperlipidemia) 07/26/2015  . Arthritis, degenerative 07/26/2015  . Thyroid nodule 07/26/2015  . Cellulitis of knee, right 05/22/2015  . Malignant neoplasm of appendix ([place]) 11/20/2014  . ED (erectile dysfunction) of organic origin 01/31/2013  . H/O malignant neoplasm of prostate 01/31/2013    Past Surgical History:  Procedure Laterality Date  . APPENDECTOMY  11/15/14   patient report it was cancerous   . COLONOSCOPY  07-09-06   Dr Allen Norris  .  COLONOSCOPY WITH PROPOFOL N/A 11/12/2016   Procedure: COLONOSCOPY WITH PROPOFOL;  Surgeon: Christene Lye, MD;  Location: [facility] ENDOSCOPY;  Service: Endoscopy;  Laterality: N/A;  . PROSTATE SURGERY  June 2008  . SHOULDER SURGERY  Nov 1999, Oct 2015    His family history includes Cancer in his mother; Diabetes in his father; Parkinson's disease in his father.     Outpatient Encounter Medications as of 04/20/2019  Medication Sig Note  . Ascorbic Acid (VITAMIN C) 1000 MG tablet Take 1,000 mg by mouth daily. 11/14/2014: Received from: [place]:   . aspirin EC 81 MG tablet Take 81 mg by mouth daily. 11/14/2014: Received from: [place]:   . celecoxib (CELEBREX) 200 MG capsule TAKE 1 CAPSULE BY MOUTH EVERY DAY AS NEEDED   . Multiple Vitamins-Minerals (MULTIVITAMIN ADULT PO) Take by mouth daily.   . Omega-3 Fatty Acids (FISH OIL) 1200 MG CAPS Take by mouth daily.  07/09/2015: Received from: [place]  . sildenafil (REVATIO) 20 MG tablet 2-5 tabs 1 hour prior to intercourse   . simvastatin (ZOCOR) 10 MG tablet TAKE 1 TABLET BY MOUTH EVERYDAY AT BEDTIME    No facility-administered encounter medications on file as of 04/20/2019.     Patient Care Team: [name]., MD as PCP - General (Family Medicine) [name], MD as Consulting Physician (Internal Medicine) [name], MD as Consulting Physician (Urology)      Objective:   Vitals:  Vitals:   04/20/19 0939  BP: 129/72  Pulse: (!) 49  Resp: 16  Temp: (!) 96.6 F (35.9 C)  TempSrc: Skin  Weight: 181 lb (82.1 kg)  Height: 5\' 10"  (1.778 m)    Physical Exam Constitutional:      Appearance: Normal appearance. He is obese.  HENT:     Head: Normocephalic and atraumatic.     Right Ear: Tympanic membrane, ear canal and external ear normal.     Left Ear: Tympanic membrane, ear canal and external ear normal.     Nose: Nose normal.     Mouth/Throat:     Mouth: Mucous membranes are dry.     Pharynx: Oropharynx is clear.  Eyes:     Extraocular Movements: Extraocular movements intact.     Conjunctiva/sclera: Conjunctivae normal.     Pupils: Pupils are equal, round, and reactive to light.  Neck:     Musculoskeletal: Normal range of motion and neck supple.  Cardiovascular:     Rate and Rhythm: Regular rhythm. Bradycardia present.     Pulses: Normal pulses.     Heart sounds: Normal heart sounds.  Pulmonary:     Effort: Pulmonary effort is normal.     Breath sounds: Normal breath sounds.   Abdominal:     General: Abdomen is flat. Bowel sounds are normal.     Palpations: Abdomen is soft.  Genitourinary:    Penis: Normal.      Scrotum/Testes: Normal.  Musculoskeletal: Normal range of motion.  Skin:    General: Skin is warm and dry.  Neurological:     General: No focal deficit present.     Mental Status: He is alert and oriented to person, place, and time. Mental status is at baseline.  Psychiatric:        Mood and Affect: Mood normal.        Behavior: Behavior normal.        Thought Content: Thought content normal.  Judgment: Judgment normal.        Assessment & Plan:       Routine Health Maintenance and Physical Exam  Exercise Activities and Dietary recommendations Goals    . DIET - INCREASE WATER INTAKE     Recommend increasing water intake to 6-8 glasses a day.        Immunization History  Administered Date(s) Administered  . Influenza, High Dose Seasonal PF 08/14/2016, 06/16/2017, 06/09/2018  . Influenza,inj,Quad PF,6+ Mos 08/09/2013, 08/14/2015  . Pneumococcal Conjugate-13 08/14/2015  . Pneumococcal Polysaccharide-23 06/14/2017, 09/07/2017  . Td 01/03/2005  . Tdap 07/24/2011  . Zoster 08/09/2013    Health Maintenance  Topic Date Due  . INFLUENZA VACCINE  03/05/2019  . TETANUS/TDAP  07/23/2021  . COLONOSCOPY  11/12/2021  . Hepatitis C Screening  Completed  . PNA vac Low Risk Adult  Completed     Discussed health benefits of physical activity, and encouraged him to engage in regular exercise appropriate for his age and condition.    1. Annual physical exam   2. Thyroid nodule Normal exam today - TSH  3. Prediabetes  - Hemoglobin A1c  4. Hyperlipidemia, unspecified hyperlipidemia type  - CBC with Differential/Platelet - Comprehensive metabolic panel - Lipid Panel With LDL/HDL Ratio  5. Prostate cancer ([place])   6. History of prostate cancer  - PSA  7. Need for influenza vaccination  - Flu Vaccine QUAD High  Dose(Fluad) 8.h/o cancer of appendix  9.h/o Skin cancer Per Dr Kellie Moor. I have done the exam and reviewed the chart and it is accurate to the best of my knowledge. Development worker, community has been used and  any errors in dictation or transcription are unintentional. Miguel Aschoff M.D. [facility]

## 2019-04-21 LAB — LIPID PANEL WITH LDL/HDL RATIO
Cholesterol, Total: 182 mg/dL (ref 100–199)
HDL: 63 mg/dL (ref >39)
LDL Chol Calc (NIH): 102 mg/dL — ABNORMAL HIGH (ref 0–99)
LDL/HDL Ratio: 1.6 ratio (ref 0.0–3.6)
Triglycerides: 93 mg/dL (ref 0–149)
VLDL Cholesterol Cal: 17 mg/dL (ref 5–40)

## 2019-04-21 LAB — CBC WITH DIFFERENTIAL/PLATELET
Basophils Absolute: 0 10*3/uL (ref 0.0–0.2)
Basos: 0 %
EOS (ABSOLUTE): 0.1 10*3/uL (ref 0.0–0.4)
Eos: 2 %
Hematocrit: 42.2 % (ref 37.5–51.0)
Hemoglobin: 14.5 g/dL (ref 13.0–17.7)
Immature Grans (Abs): 0 10*3/uL (ref 0.0–0.1)
Immature Granulocytes: 0 %
Lymphocytes Absolute: 1.4 10*3/uL (ref 0.7–3.1)
Lymphs: 27 %
MCH: 31.9 pg (ref 26.6–33.0)
MCHC: 34.4 g/dL (ref 31.5–35.7)
MCV: 93 fL (ref 79–97)
Monocytes Absolute: 0.4 10*3/uL (ref 0.1–0.9)
Monocytes: 8 %
Neutrophils Absolute: 3.3 10*3/uL (ref 1.4–7.0)
Neutrophils: 63 %
Platelets: 202 10*3/uL (ref 150–450)
RBC: 4.54 x10E6/uL (ref 4.14–5.80)
RDW: 11.8 % (ref 11.6–15.4)
WBC: 5.3 10*3/uL (ref 3.4–10.8)

## 2019-04-21 LAB — COMPREHENSIVE METABOLIC PANEL
ALT: 41 IU/L (ref 0–44)
AST: 30 IU/L (ref 0–40)
Albumin/Globulin Ratio: 2.2 (ref 1.2–2.2)
Albumin: 4.6 g/dL (ref 3.8–4.8)
Alkaline Phosphatase: 33 IU/L — ABNORMAL LOW (ref 39–117)
BUN/Creatinine Ratio: 18 (ref 10–24)
BUN: 17 mg/dL (ref 8–27)
Bilirubin Total: 0.8 mg/dL (ref 0.0–1.2)
CO2: 26 mmol/L (ref 20–29)
Calcium: 9.2 mg/dL (ref 8.6–10.2)
Chloride: 102 mmol/L (ref 96–106)
Creatinine, Ser: 0.97 mg/dL (ref 0.76–1.27)
GFR calc Af Amer: 92 mL/min/{1.73_m2} (ref >59)
GFR calc non Af Amer: 80 mL/min/{1.73_m2} (ref >59)
Globulin, Total: 2.1 g/dL (ref 1.5–4.5)
Glucose: 106 mg/dL — ABNORMAL HIGH (ref 65–99)
Potassium: 4.6 mmol/L (ref 3.5–5.2)
Sodium: 139 mmol/L (ref 134–144)
Total Protein: 6.7 g/dL (ref 6.0–8.5)

## 2019-04-21 LAB — HEMOGLOBIN A1C
Est. average glucose Bld gHb Est-mCnc: 103 mg/dL
Hgb A1c MFr Bld: 5.2 % (ref 4.8–5.6)

## 2019-04-21 LAB — PSA: Prostate Specific Ag, Serum: 0.1 ng/mL (ref 0.0–4.0)

## 2019-04-21 LAB — TSH: TSH: 1.82 u[IU]/mL (ref 0.450–4.500)

## 2019-04-22 ENCOUNTER — Telehealth: Payer: Self-pay

## 2019-04-22 NOTE — Telephone Encounter (Signed)
-----   Message from [name]., MD sent at 04/21/2019 10:00 AM EDT ----- Labs good.

## 2019-04-22 NOTE — Telephone Encounter (Signed)
Advised 

## 2019-04-22 NOTE — Telephone Encounter (Signed)
LMTCB ED 

## 2019-05-12 DIAGNOSIS — C44519 Basal cell carcinoma of skin of other part of trunk: Secondary | ICD-10-CM | POA: Diagnosis not present

## 2019-07-18 ENCOUNTER — Other Ambulatory Visit: Payer: Self-pay | Admitting: Family Medicine

## 2019-09-14 ENCOUNTER — Ambulatory Visit: Payer: PPO

## 2019-09-14 DIAGNOSIS — C61 Malignant neoplasm of prostate: Secondary | ICD-10-CM | POA: Diagnosis not present

## 2019-09-14 DIAGNOSIS — N5201 Erectile dysfunction due to arterial insufficiency: Secondary | ICD-10-CM | POA: Diagnosis not present

## 2019-11-11 DIAGNOSIS — D2262 Melanocytic nevi of left upper limb, including shoulder: Secondary | ICD-10-CM | POA: Diagnosis not present

## 2019-11-11 DIAGNOSIS — Z85828 Personal history of other malignant neoplasm of skin: Secondary | ICD-10-CM | POA: Diagnosis not present

## 2019-11-11 DIAGNOSIS — L821 Other seborrheic keratosis: Secondary | ICD-10-CM | POA: Diagnosis not present

## 2019-11-11 DIAGNOSIS — D485 Neoplasm of uncertain behavior of skin: Secondary | ICD-10-CM | POA: Diagnosis not present

## 2019-11-11 DIAGNOSIS — L570 Actinic keratosis: Secondary | ICD-10-CM | POA: Diagnosis not present

## 2019-11-11 DIAGNOSIS — D2272 Melanocytic nevi of left lower limb, including hip: Secondary | ICD-10-CM | POA: Diagnosis not present

## 2019-11-11 DIAGNOSIS — D0421 Carcinoma in situ of skin of right ear and external auricular canal: Secondary | ICD-10-CM | POA: Diagnosis not present

## 2019-11-11 DIAGNOSIS — D2271 Melanocytic nevi of right lower limb, including hip: Secondary | ICD-10-CM | POA: Diagnosis not present

## 2019-11-11 DIAGNOSIS — D2261 Melanocytic nevi of right upper limb, including shoulder: Secondary | ICD-10-CM | POA: Diagnosis not present

## 2019-11-15 ENCOUNTER — Telehealth: Payer: Self-pay

## 2019-11-15 ENCOUNTER — Ambulatory Visit: Payer: Self-pay

## 2019-11-15 NOTE — Telephone Encounter (Signed)
Copied from [place] [id number]. Topic: Appointment Scheduling - Scheduling Inquiry for Clinic >> Nov 15, 2019  9:24 AM Alanda Slim E wrote: Reason for CRM: Pt is having abdominal pain that he had when Dr. Rosanna Randy discovered he had appendix cancer / Pt would like to see Dr. Rosanna Randy and no one else about this issue asap/ Dr. Alben Spittle next available appt is 4.28.21 but Pt doesn't want to wait that long/ please advise

## 2019-11-15 NOTE — Telephone Encounter (Signed)
Patient appointment rescheduled for tomorrow.

## 2019-11-15 NOTE — Telephone Encounter (Signed)
How about tomorrow at 3:00.

## 2019-11-15 NOTE — Progress Notes (Signed)
Established patient visit     Patient: Edward Mccoy   DOB: 18-Aug-1950   69 y.o. Male  MRN: [id number] Visit Date: 11/16/2019  Today's healthcare provider: [name], MD  Subjective:    Chief Complaint  Patient presents with  . Abdominal Pain   Abdominal Pain This is a new problem. The current episode started 1 to 4 weeks ago (1 month ago). The onset quality is gradual. The problem occurs intermittently. The problem has been unchanged. The pain is located in the RLQ and right flank. The quality of the pain is sharp. The abdominal pain radiates to the RLQ. Pertinent negatives include no anorexia, arthralgias, belching, constipation, diarrhea, dysuria, fever, flatus, frequency, headaches, hematochezia, hematuria, melena, myalgias, nausea, vomiting or weight loss. The pain is aggravated by deep breathing, coughing, certain positions and movement. The pain is relieved by nothing. He has tried nothing for the symptoms.   Patient states he has had RLQ abdominal pain for around one month. Patient states pain is intermittent and only occurs with movement or position. Patient states pain is sharp and stabbing, only lasting for seconds.  It is  in the right lower quadrant. No diarrhea nausea vomiting, no hematuria or dysuria or urinary symptoms. He has not sought treatment earlier as his wife had 8 hours of cervical spine surgery in the last couple of weeks.      Medications: Outpatient Medications Prior to Visit  Medication Sig  . Ascorbic Acid (VITAMIN C) 1000 MG tablet Take 1,000 mg by mouth daily.  Mar[place] Kitchen aspirin EC 81 MG tablet Take 81 mg by mouth daily.  . celecoxib (CELEBREX) 200 MG capsule TAKE 1 CAPSULE BY MOUTH EVERY DAY AS NEEDED  . Multiple Vitamins-Minerals (MULTIVITAMIN ADULT PO) Take by mouth daily.  . Omega-3 Fatty Acids (FISH OIL) 1200 MG CAPS Take by mouth daily.   . sildenafil (REVATIO) 20 MG tablet 2-5 tabs 1 hour prior to intercourse  . simvastatin (ZOCOR) 10 MG  tablet TAKE 1 TABLET BY MOUTH EVERYDAY AT BEDTIME   No facility-administered medications prior to visit.    Review of Systems  Constitutional: Negative for appetite change, chills, fever and weight loss.  Respiratory: Negative for chest tightness, shortness of breath and wheezing.   Cardiovascular: Negative for chest pain and palpitations.  Gastrointestinal: Positive for abdominal pain. Negative for anorexia, constipation, diarrhea, flatus, hematochezia, melena, nausea and vomiting.  Genitourinary: Negative for dysuria, frequency and hematuria.  Musculoskeletal: Negative for arthralgias and myalgias.  Allergic/Immunologic: Negative.   Neurological: Negative for headaches.  Psychiatric/Behavioral: Negative.     Last vitamin B12 and Folate No results found for: VITAMINB12, FOLATE      Objective:    There were no vitals taken for this visit. BP Readings from Last 3 Encounters:  11/16/19 112/70  04/20/19 129/72  04/13/19 117/70   Wt Readings from Last 3 Encounters:  11/16/19 184 lb (83.5 kg)  04/20/19 181 lb (82.1 kg)  04/13/19 186 lb (84.4 kg)      Physical Exam Constitutional:      Appearance: He is normal weight.  Cardiovascular:     Rate and Rhythm: Normal rate and regular rhythm.     Heart sounds: Normal heart sounds.  Pulmonary:     Effort: Pulmonary effort is normal.     Breath sounds: Normal breath sounds.  Abdominal:     Palpations: Abdomen is soft.     Tenderness: There is no abdominal tenderness.     Hernia: No  hernia is present.  Musculoskeletal:     Cervical back: Normal range of motion.  Skin:    General: Skin is warm and dry.  Neurological:     General: No focal deficit present.     Mental Status: He is alert and oriented to person, place, and time.  Psychiatric:        Mood and Affect: Mood normal.        Behavior: Behavior normal.       No results found for any visits on 11/16/19.    Assessment & Plan:    1. Right lower quadrant  abdominal pain I think this is muscular pain.  However, due to history of appendiceal cancer will refer back to oncology a couple of months early to see if he thinks he needs to move up his work-up i.e. scan.  No lab work today with normal evaluation and normal exam - Ambulatory referral to Oncology  2. Flank pain  - POCT urinalysis dipstick - Ambulatory referral to Oncology  3. History of prostate cancer   4. Cancer of appendix [facility]) Appendectomy April 2016.      Ahnika Hannibal Cranford Mon, MD  [facility] [phone] (phone) [phone] (fax)  [place]

## 2019-11-15 NOTE — Telephone Encounter (Signed)
Scheduled patient for office visit on Thursday with Dr. Rosanna Randy.

## 2019-11-15 NOTE — Telephone Encounter (Signed)
Incoming call from Patient  With a complaint of abdominal pain.  Patient has a Hx of abdominal Ca.  Patient states that pain has been occurring for about a month.  He has also been caring for his wife,which  why he hasn't  Contacted office.  It was a gradual onset.  Constant pain  . Does not radiate . Rated constant and moderate.  States he is a friend of Dr.  Rosanna Randy. Denies any other Sx.  Encouraged Patient to go to Urgent Care if Sx worsen.  Patient voiced understanding. Patient Request an appointment with Dr.  Rosanna Randy. And a return call back.  Patient upset.          Reason for Disposition . [1] MODERATE pain (e.g., interferes with normal activities) AND [2] pain comes and goes (cramps) AND [3] present > 24 hours  (Exception: pain with Vomiting or Diarrhea - see that Guideline)  Answer Assessment - Initial Assessment Questions 1. LOCATION: "Where does it hurt?"    Lower around where appendix is right right side 2. RADIATION: "Does the pain shoot anywhere else?" (e.g., chest, back)   denies 3. ONSET: "When did the pain begin?" (Minutes, hours or days ago)     month 4. SUDDEN: "Gradual or sudden onset?"     gradual 5. PATTERN "Does the pain come and go, or is it constant?"    - If constant: "Is it getting better, staying the same, or worsening?"      (Note: Constant means the pain never goes away completely; most serious pain is constant and it progresses)     - If intermittent: "How long does it last?" "Do you have pain now?"     (Note: Intermittent means the pain goes away completely between bouts)     constant 6. SEVERITY: "How bad is the pain?"  (e.g., Scale 1-10; mild, moderate, or severe)    - MILD (1-3): doesn't interfere with normal activities, abdomen soft and not tender to touch     - MODERATE (4-7): interferes with normal activities or awakens from sleep, tender to touch     - SEVERE (8-10): excruciating pain, doubled over, unable to do any normal activities       pain 7.  RECURRENT SYMPTOM: "Have you ever had this type of abdominal pain before?" If so, ask: "When was the last time?" and "What happened that time?"     8. CAUSE: "What do you think is causing the abdominal pain?"     *No Answer* 9. RELIEVING/AGGRAVATING FACTORS: "What makes it better or worse?" (e.g., movement, antacids, bowel movement)     denies 10. OTHER SYMPTOMS: "Has there been any vomiting, diarrhea, constipation, or urine problems?"      denies  Protocols used: ABDOMINAL PAIN - MALE-A-AH

## 2019-11-16 ENCOUNTER — Ambulatory Visit (INDEPENDENT_AMBULATORY_CARE_PROVIDER_SITE_OTHER): Payer: PPO | Admitting: Family Medicine

## 2019-11-16 ENCOUNTER — Encounter: Payer: Self-pay | Admitting: Family Medicine

## 2019-11-16 ENCOUNTER — Other Ambulatory Visit: Payer: Self-pay

## 2019-11-16 VITALS — BP 112/70 | HR 65 | Temp 97.3°F | Resp 16 | Ht 69.0 in | Wt 184.0 lb

## 2019-11-16 DIAGNOSIS — C181 Malignant neoplasm of appendix: Secondary | ICD-10-CM | POA: Diagnosis not present

## 2019-11-16 DIAGNOSIS — R109 Unspecified abdominal pain: Secondary | ICD-10-CM | POA: Diagnosis not present

## 2019-11-16 DIAGNOSIS — R1031 Right lower quadrant pain: Secondary | ICD-10-CM

## 2019-11-16 DIAGNOSIS — Z8546 Personal history of malignant neoplasm of prostate: Secondary | ICD-10-CM | POA: Diagnosis not present

## 2019-11-16 LAB — POCT URINALYSIS DIPSTICK
Appearance: NORMAL
Bilirubin, UA: NEGATIVE
Blood, UA: NEGATIVE
Glucose, UA: NEGATIVE
Ketones, UA: NEGATIVE
Leukocytes, UA: NEGATIVE
Nitrite, UA: NEGATIVE
Odor: NORMAL
Protein, UA: NEGATIVE
Spec Grav, UA: 1.01 (ref 1.010–1.025)
Urobilinogen, UA: 0.2 E.U./dL
pH, UA: 6 (ref 5.0–8.0)

## 2019-11-16 NOTE — Patient Instructions (Addendum)
For abdominal pain use heating pain in the evenings. See Dr. Rogue Bussing for check up.

## 2019-11-17 ENCOUNTER — Ambulatory Visit: Payer: PPO | Admitting: Family Medicine

## 2019-11-22 ENCOUNTER — Encounter: Payer: Self-pay | Admitting: Internal Medicine

## 2019-11-22 ENCOUNTER — Other Ambulatory Visit: Payer: Self-pay

## 2019-11-23 ENCOUNTER — Inpatient Hospital Stay: Payer: PPO | Attending: Internal Medicine | Admitting: Internal Medicine

## 2019-11-23 ENCOUNTER — Encounter: Payer: Self-pay | Admitting: Internal Medicine

## 2019-11-23 ENCOUNTER — Inpatient Hospital Stay: Payer: PPO

## 2019-11-23 DIAGNOSIS — C181 Malignant neoplasm of appendix: Secondary | ICD-10-CM | POA: Diagnosis not present

## 2019-11-23 DIAGNOSIS — C61 Malignant neoplasm of prostate: Secondary | ICD-10-CM | POA: Insufficient documentation

## 2019-11-23 DIAGNOSIS — R1031 Right lower quadrant pain: Secondary | ICD-10-CM | POA: Insufficient documentation

## 2019-11-23 DIAGNOSIS — Z8546 Personal history of malignant neoplasm of prostate: Secondary | ICD-10-CM | POA: Insufficient documentation

## 2019-11-23 DIAGNOSIS — Z85038 Personal history of other malignant neoplasm of large intestine: Secondary | ICD-10-CM | POA: Insufficient documentation

## 2019-11-23 LAB — COMPREHENSIVE METABOLIC PANEL
ALT: 42 U/L (ref 0–44)
AST: 31 U/L (ref 15–41)
Albumin: 4.1 g/dL (ref 3.5–5.0)
Alkaline Phosphatase: 34 U/L — ABNORMAL LOW (ref 38–126)
Anion gap: 7 (ref 5–15)
BUN: 27 mg/dL — ABNORMAL HIGH (ref 8–23)
CO2: 28 mmol/L (ref 22–32)
Calcium: 8.8 mg/dL — ABNORMAL LOW (ref 8.9–10.3)
Chloride: 103 mmol/L (ref 98–111)
Creatinine, Ser: 0.91 mg/dL (ref 0.61–1.24)
GFR calc Af Amer: >60 mL/min (ref >60)
GFR calc non Af Amer: >60 mL/min (ref >60)
Glucose, Bld: 94 mg/dL (ref 70–99)
Potassium: 4.6 mmol/L (ref 3.5–5.1)
Sodium: 138 mmol/L (ref 135–145)
Total Bilirubin: 1 mg/dL (ref 0.3–1.2)
Total Protein: 6.6 g/dL (ref 6.5–8.1)

## 2019-11-23 LAB — CBC WITH DIFFERENTIAL/PLATELET
Abs Immature Granulocytes: 0.01 10*3/uL (ref 0.00–0.07)
Basophils Absolute: 0 10*3/uL (ref 0.0–0.1)
Basophils Relative: 1 %
Eosinophils Absolute: 0.2 10*3/uL (ref 0.0–0.5)
Eosinophils Relative: 4 %
HCT: 40.2 % (ref 39.0–52.0)
Hemoglobin: 13.4 g/dL (ref 13.0–17.0)
Immature Granulocytes: 0 %
Lymphocytes Relative: 30 %
Lymphs Abs: 1.5 10*3/uL (ref 0.7–4.0)
MCH: 31.7 pg (ref 26.0–34.0)
MCHC: 33.3 g/dL (ref 30.0–36.0)
MCV: 95 fL (ref 80.0–100.0)
Monocytes Absolute: 0.5 10*3/uL (ref 0.1–1.0)
Monocytes Relative: 11 %
Neutro Abs: 2.7 10*3/uL (ref 1.7–7.7)
Neutrophils Relative %: 54 %
Platelets: 174 10*3/uL (ref 150–400)
RBC: 4.23 MIL/uL (ref 4.22–5.81)
RDW: 12.1 % (ref 11.5–15.5)
WBC: 5 10*3/uL (ref 4.0–10.5)
nRBC: 0 % (ref 0.0–0.2)

## 2019-11-23 NOTE — Progress Notes (Signed)
[place] OFFICE PROGRESS NOTE  Patient Care Team: [name]., MD as PCP - General (Family Medicine) [name], MD as Consulting Physician (Internal Medicine) [name], MD as Consulting Physician (Urology)  Cancer Staging No matching staging information was found for the patient.   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  Dr.Sankar] last CT March 2018- NED [discussed not doing further surveillance scans]  # colo [Dr.sankar April 2018- repeat colo in 5 years]  # Prostate cancer 2008 s/p Prostatectomy [Dr.Stoiff].    Malignant neoplasm of appendix [facility])      INTERVAL HISTORY:  Edward Mccoy 69 y.o.  male pleasant patient above history of carcinoma of the appendix stage I and history of prostate cancer is here for follow-up.  Patient was recently evaluated by PCP for concerns of right lower quadrant abdominal pain.  Noted about 2 months ago.  3-4 on a scale of 10.  No radiation.  No constipation diarrhea.  Is concerned about recurrent malignancy.  Denies abdominal distention.  Review of Systems  Constitutional: Negative for chills, diaphoresis, fever, malaise/fatigue and weight loss.  HENT: Negative for nosebleeds and sore throat.   Eyes: Negative for double vision.  Respiratory: Negative for cough, hemoptysis, sputum production, shortness of breath and wheezing.   Cardiovascular: Negative for chest pain, palpitations, orthopnea and leg swelling.  Gastrointestinal: Positive for abdominal pain. Negative for blood in stool, constipation, diarrhea, heartburn, melena, nausea and vomiting.  Genitourinary: Negative for dysuria, frequency and urgency.  Musculoskeletal: Negative for back pain and joint pain.  Skin: Negative.  Negative for itching and rash.  Neurological: Negative for  dizziness, tingling, focal weakness, weakness and headaches.  Endo/Heme/Allergies: Does not bruise/bleed easily.  Psychiatric/Behavioral: Negative for depression. The patient is not nervous/anxious and does not have insomnia.       PAST MEDICAL HISTORY :  Past Medical History:  Diagnosis Date  . Cancer [facility]) 2008   prostate  . Cancer of appendix ([place]) 11/15/14   high-grade appendiceal mucinous neoplasm  . Colon polyp   . ED (erectile dysfunction)   . MRSA infection 05/2015   Right Kneee    PAST SURGICAL HISTORY :   Past Surgical History:  Procedure Laterality Date  . APPENDECTOMY  11/15/14   patient report it was cancerous   . COLONOSCOPY  07-09-06   Dr Allen Norris  . COLONOSCOPY WITH PROPOFOL N/A 11/12/2016   Procedure: COLONOSCOPY WITH PROPOFOL;  Surgeon: Christene Lye, MD;  Location: [facility] ENDOSCOPY;  Service: Endoscopy;  Laterality: N/A;  . PROSTATE SURGERY  June 2008  . SHOULDER SURGERY  Nov 1999, Oct 2015    FAMILY HISTORY :   Family History  Problem Relation Age of Onset  . Cancer Mother        unknown type of ca  . Diabetes Father   . Parkinson's disease Father     SOCIAL HISTORY:   Social History   Tobacco Use  . Smoking status: Never Smoker  . Smokeless tobacco: Never Used  Substance Use Topics  . Alcohol use: Yes    Alcohol/week: 0.0 standard drinks    Comment: occasionally- beer  . Drug use: No    ALLERGIES:  has No Known Allergies.  MEDICATIONS:  Current Outpatient Medications  Medication Sig Dispense Refill  . Ascorbic Acid (VITAMIN C) 1000 MG tablet Take 1,000 mg by mouth  daily.    . aspirin EC 81 MG tablet Take 81 mg by mouth daily.    . celecoxib (CELEBREX) 200 MG capsule TAKE 1 CAPSULE BY MOUTH EVERY DAY AS NEEDED 90 capsule 1  . Multiple Vitamins-Minerals (MULTIVITAMIN ADULT PO) Take by mouth daily.    . Omega-3 Fatty Acids (FISH OIL) 1200 MG CAPS Take by mouth daily.     . sildenafil (REVATIO) 20 MG tablet 2-5 tabs 1 hour prior to  intercourse 90 tablet 1  . simvastatin (ZOCOR) 10 MG tablet TAKE 1 TABLET BY MOUTH EVERYDAY AT BEDTIME 90 tablet 3   No current facility-administered medications for this visit.    PHYSICAL EXAMINATION: ECOG PERFORMANCE STATUS: 0 - Asymptomatic  BP 121/70 (BP Location: Left Arm, Patient Position: Sitting, Cuff Size: Normal)   Pulse (!) 51   Temp (!) 96.9 F (36.1 C) (Tympanic)   Wt 186 lb 2 oz (84.4 kg)   BMI 27.49 kg/m   Filed Weights   11/23/19 1012  Weight: 186 lb 2 oz (84.4 kg)    Physical Exam  Constitutional: He is oriented to person, place, and time and well-developed, well-nourished, and in no distress.  HENT:  Head: Normocephalic and atraumatic.  Mouth/Throat: Oropharynx is clear and moist. No oropharyngeal exudate.  Eyes: Pupils are equal, round, and reactive to light.  Cardiovascular: Normal rate and regular rhythm.  Pulmonary/Chest: Effort normal and breath sounds normal. No respiratory distress. He has no wheezes.  Abdominal: Soft. Bowel sounds are normal. He exhibits no distension and no mass. There is no abdominal tenderness. There is no rebound and no guarding.  Musculoskeletal:        General: No tenderness or edema. Normal range of motion.     Cervical back: Normal range of motion and neck supple.  Neurological: He is alert and oriented to person, place, and time.  Skin: Skin is warm.  Psychiatric: Affect normal.    LABORATORY DATA:  I have reviewed the data as listed    Component Value Date/Time   NA 138 11/23/2019 1047   NA 139 04/20/2019 1017   NA 139 11/13/2014 1553   K 4.6 11/23/2019 1047   K 3.9 11/13/2014 1553   CL 103 11/23/2019 1047   CL 104 11/13/2014 1553   CO2 28 11/23/2019 1047   CO2 28 11/13/2014 1553   GLUCOSE 94 11/23/2019 1047   GLUCOSE 93 11/13/2014 1553   BUN 27 (H) 11/23/2019 1047   BUN 17 04/20/2019 1017   BUN 18 11/13/2014 1553   CREATININE 0.91 11/23/2019 1047   CREATININE 1.02 11/13/2014 1553   CALCIUM 8.8 (L)  11/23/2019 1047   CALCIUM 9.1 11/13/2014 1553   PROT 6.6 11/23/2019 1047   PROT 6.7 04/20/2019 1017   PROT 7.3 11/13/2014 1553   ALBUMIN 4.1 11/23/2019 1047   ALBUMIN 4.6 04/20/2019 1017   ALBUMIN 4.6 11/13/2014 1553   AST 31 11/23/2019 1047   AST 30 11/13/2014 1553   ALT 42 11/23/2019 1047   ALT 34 11/13/2014 1553   ALKPHOS 34 (L) 11/23/2019 1047   ALKPHOS 39 11/13/2014 1553   BILITOT 1.0 11/23/2019 1047   BILITOT 0.8 04/20/2019 1017   BILITOT 0.9 11/13/2014 1553   GFRNONAA >60 11/23/2019 1047   GFRNONAA >60 11/13/2014 1553   GFRAA >60 11/23/2019 1047   GFRAA >60 11/13/2014 1553    No results found for: SPEP, UPEP  Lab Results  Component Value Date   WBC 5.0 11/23/2019   NEUTROABS 2.7 11/23/2019  HGB 13.4 11/23/2019   HCT 40.2 11/23/2019   MCV 95.0 11/23/2019   PLT 174 11/23/2019      Chemistry      Component Value Date/Time   NA 138 11/23/2019 1047   NA 139 04/20/2019 1017   NA 139 11/13/2014 1553   K 4.6 11/23/2019 1047   K 3.9 11/13/2014 1553   CL 103 11/23/2019 1047   CL 104 11/13/2014 1553   CO2 28 11/23/2019 1047   CO2 28 11/13/2014 1553   BUN 27 (H) 11/23/2019 1047   BUN 17 04/20/2019 1017   BUN 18 11/13/2014 1553   CREATININE 0.91 11/23/2019 1047   CREATININE 1.02 11/13/2014 1553   GLU 102 08/10/2014 0000      Component Value Date/Time   CALCIUM 8.8 (L) 11/23/2019 1047   CALCIUM 9.1 11/13/2014 1553   ALKPHOS 34 (L) 11/23/2019 1047   ALKPHOS 39 11/13/2014 1553   AST 31 11/23/2019 1047   AST 30 11/13/2014 1553   ALT 42 11/23/2019 1047   ALT 34 11/13/2014 1553   BILITOT 1.0 11/23/2019 1047   BILITOT 0.8 04/20/2019 1017   BILITOT 0.9 11/13/2014 1553       RADIOGRAPHIC STUDIES: I have personally reviewed the radiological images as listed and agreed with the findings in the report. No results found.   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  Given the worsening abdominal pain right lower quadrant-question recurrent malignancy.  Recommend a CT scan of the abdomen pelvis ASAP.  Will call with results.  # Prostate cancer-clinically no evidence of recurrence; PSA-SEP 2020--undetectable.  Continue follow-up with urology [Dr.Stoiff].  Stable  # DISPOSITION:  # CT a/p ASAP # labs- cbc/cmp/cea # Follow-up in 12 months- MD with labs-cbc/cmp/CEA- Dr.B    Orders Placed This Encounter  Procedures  . CT Abdomen Pelvis W Contrast    Standing Status:   Future    Standing Expiration Date:   11/22/2020    Order Specific Question:   ** REASON FOR EXAM (FREE TEXT)    Answer:   abdominal pain; Hx of apendix cancer    Order Specific Question:   If indicated for the ordered procedure, I authorize the administration of contrast media per Radiology protocol    Answer:   Yes    Order Specific Question:   Preferred imaging location?    Answer:   [facility]    Order Specific Question:   Is Oral Contrast requested for this exam?    Answer:   Yes, Per Radiology protocol    Order Specific Question:   Radiology Contrast Protocol - do NOT remove file path    Answer:   [email or web address]   All questions were answered. The patient knows to call the clinic with any problems, questions or concerns.      [name], MD 11/23/2019 1:05 PM

## 2019-11-23 NOTE — Assessment & Plan Note (Addendum)
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  Given the worsening abdominal pain right lower quadrant-question recurrent malignancy.  Recommend a CT scan of the abdomen pelvis ASAP.  Will call with results.  # Prostate cancer-clinically no evidence of recurrence; PSA-SEP 2020--undetectable.  Continue follow-up with urology [Dr.Stoiff].  Stable  # DISPOSITION:  # CT a/p ASAP # labs- cbc/cmp/cea # Follow-up in 12 months- MD with labs-cbc/cmp/CEA- Dr.B

## 2019-11-24 LAB — CEA: CEA: 1.2 ng/mL (ref 0.0–4.7)

## 2019-11-30 ENCOUNTER — Ambulatory Visit
Admission: RE | Admit: 2019-11-30 | Discharge: 2019-11-30 | Disposition: A | Discharge status: Home / Self Care | Payer: PPO | Source: Ambulatory Visit | Attending: Internal Medicine | Admitting: Internal Medicine

## 2019-11-30 ENCOUNTER — Other Ambulatory Visit: Payer: Self-pay

## 2019-11-30 DIAGNOSIS — C181 Malignant neoplasm of appendix: Secondary | ICD-10-CM | POA: Diagnosis not present

## 2019-11-30 DIAGNOSIS — R1031 Right lower quadrant pain: Secondary | ICD-10-CM | POA: Diagnosis not present

## 2019-11-30 MED ORDER — IOHEXOL 300 MG/ML  SOLN
100.0000 mL | Freq: Once | INTRAMUSCULAR | Status: AC | PRN
  Administered 2019-11-30: 100 mL via INTRAVENOUS

## 2019-12-01 ENCOUNTER — Telehealth: Payer: Self-pay | Admitting: Internal Medicine

## 2019-12-01 NOTE — Telephone Encounter (Signed)
On 4/28- Spoke to patient regarding results of CT scan-negative for recurrence.  Continue follow-up as planned. GB

## 2019-12-02 DIAGNOSIS — D0421 Carcinoma in situ of skin of right ear and external auricular canal: Secondary | ICD-10-CM | POA: Diagnosis not present

## 2019-12-02 NOTE — Telephone Encounter (Signed)
Thank you :)

## 2019-12-15 ENCOUNTER — Other Ambulatory Visit: Payer: Self-pay | Admitting: Family Medicine

## 2019-12-15 DIAGNOSIS — E7800 Pure hypercholesterolemia, unspecified: Secondary | ICD-10-CM

## 2019-12-26 ENCOUNTER — Telehealth: Payer: Self-pay | Admitting: Urology

## 2019-12-26 ENCOUNTER — Other Ambulatory Visit: Payer: Self-pay | Admitting: *Deleted

## 2019-12-26 MED ORDER — SILDENAFIL CITRATE 20 MG PO TABS: ORAL_TABLET | ORAL | 1 refills | Status: DC

## 2019-12-26 NOTE — Telephone Encounter (Signed)
Pt would like a refill sent to [facility] in [place].  He stopped by office to tell us this.

## 2020-01-19 ENCOUNTER — Other Ambulatory Visit: Payer: Self-pay | Admitting: Family Medicine

## 2020-01-31 DIAGNOSIS — M25561 Pain in right knee: Secondary | ICD-10-CM | POA: Diagnosis not present

## 2020-01-31 DIAGNOSIS — M1711 Unilateral primary osteoarthritis, right knee: Secondary | ICD-10-CM | POA: Diagnosis not present

## 2020-01-31 DIAGNOSIS — M25552 Pain in left hip: Secondary | ICD-10-CM | POA: Diagnosis not present

## 2020-01-31 DIAGNOSIS — G5702 Lesion of sciatic nerve, left lower limb: Secondary | ICD-10-CM | POA: Diagnosis not present

## 2020-02-14 DIAGNOSIS — M1711 Unilateral primary osteoarthritis, right knee: Secondary | ICD-10-CM | POA: Diagnosis not present

## 2020-02-14 DIAGNOSIS — G5702 Lesion of sciatic nerve, left lower limb: Secondary | ICD-10-CM | POA: Diagnosis not present

## 2020-02-20 ENCOUNTER — Other Ambulatory Visit: Payer: PPO

## 2020-02-22 DIAGNOSIS — H269 Unspecified cataract: Secondary | ICD-10-CM | POA: Diagnosis not present

## 2020-02-27 DIAGNOSIS — H52223 Regular astigmatism, bilateral: Secondary | ICD-10-CM | POA: Diagnosis not present

## 2020-03-02 ENCOUNTER — Other Ambulatory Visit: Payer: PPO

## 2020-03-02 ENCOUNTER — Ambulatory Visit: Payer: PPO | Admitting: Internal Medicine

## 2020-03-19 ENCOUNTER — Other Ambulatory Visit: Payer: Self-pay | Admitting: Family Medicine

## 2020-04-10 DIAGNOSIS — M2391 Unspecified internal derangement of right knee: Secondary | ICD-10-CM | POA: Diagnosis not present

## 2020-04-10 DIAGNOSIS — M25561 Pain in right knee: Secondary | ICD-10-CM | POA: Diagnosis not present

## 2020-04-11 ENCOUNTER — Other Ambulatory Visit: Payer: Self-pay | Admitting: Physician Assistant

## 2020-04-11 DIAGNOSIS — M2391 Unspecified internal derangement of right knee: Secondary | ICD-10-CM

## 2020-04-11 DIAGNOSIS — M25561 Pain in right knee: Secondary | ICD-10-CM

## 2020-04-12 ENCOUNTER — Ambulatory Visit: Payer: PPO | Admitting: Urology

## 2020-04-17 NOTE — Progress Notes (Signed)
Subjective:   Edward Mccoy is a 69 y.o. male who presents for Medicare Annual/Subsequent preventive examination.  Review of Systems    N/A  Cardiac Risk Factors include: advanced age (>41men, >68 women);male gender;dyslipidemia     Objective:    Today's Vitals   04/23/20 1323  BP: 122/70  Pulse: 69  Temp: 97.9 F (36.6 C)  TempSrc: Oral  SpO2: 98%  Weight: 187 lb 12.8 oz (85.2 kg)  Height: 5\' 10"  (1.778 m)  PainSc: 0-No pain   Body mass index is 26.95 kg/m.  Advanced Directives 04/23/2020 11/22/2019 09/08/2018 03/01/2018 09/07/2017 08/31/2017 03/02/2017  Does Patient Have a Medical Advance Directive? Yes Yes Yes No Yes Yes Yes  Type of Paramedic of [place];Living will [place];Living will Living will - - Living will;Healthcare Power of [place];Living will  Does patient want to make changes to medical advance directive? - No - Patient declined - - - No - Patient declined -  Copy of [place] in Chart? Yes - validated most recent copy scanned in chart (See row information) Yes - validated most recent copy scanned in chart (See row information) - - Yes No - copy requested -  Would patient like information on creating a medical advance directive? - - - No - Patient declined - - -    Current Medications (verified) Outpatient Encounter Medications as of 04/23/2020  Medication Sig  . Ascorbic Acid (VITAMIN C) 1000 MG tablet Take 1,000 mg by mouth daily.  [place] Kitchen aspirin EC 81 MG tablet Take 81 mg by mouth daily.  . celecoxib (CELEBREX) 200 MG capsule TAKE 1 CAPSULE BY MOUTH EVERY DAY AS NEEDED  . Coenzyme Q10 100 MG capsule Take 100 mg by mouth daily.  . Multiple Vitamins-Minerals (MULTIVITAMIN ADULT PO) Take by mouth daily.  . mupirocin ointment (BACTROBAN) 2 % Apply 1 application topically as directed. In nasal twice weekly.  . NON FORMULARY Beet juice daily  . Omega-3 Fatty Acids (FISH OIL) 1200  MG CAPS Take by mouth daily.   . sildenafil (REVATIO) 20 MG tablet 2-5 tabs 1 hour prior to intercourse  . simvastatin (ZOCOR) 10 MG tablet TAKE 1 TABLET BY MOUTH EVERYDAY AT BEDTIME  . vitamin E 180 MG (400 UNITS) capsule Take 400 Units by mouth daily.  . [DISCONTINUED] sildenafil (REVATIO) 20 MG tablet 2-5 tabs 1 hour prior to intercourse   No facility-administered encounter medications on file as of 04/23/2020.    Allergies (verified) Patient has no known allergies.   History: Past Medical History:  Diagnosis Date  . Cancer [facility]) 2008   prostate  . Cancer of appendix ([place]) 11/15/14   high-grade appendiceal mucinous neoplasm  . Colon polyp   . ED (erectile dysfunction)   . Hyperlipidemia   . MRSA infection 05/2015   Right Kneee   Past Surgical History:  Procedure Laterality Date  . APPENDECTOMY  11/15/14   patient report it was cancerous   . COLONOSCOPY  07-09-06   Dr Allen Norris  . COLONOSCOPY WITH PROPOFOL N/A 11/12/2016   Procedure: COLONOSCOPY WITH PROPOFOL;  Surgeon: Christene Lye, MD;  Location: [facility] ENDOSCOPY;  Service: Endoscopy;  Laterality: N/A;  . PROSTATE SURGERY  June 2008  . SHOULDER SURGERY  Nov 1999, Oct 2015   Family History  Problem Relation Age of Onset  . Cancer Mother        unknown type of ca  . Diabetes Father   .  Parkinson's disease Father    Social History   Socioeconomic History  . Marital status: Married    Spouse name: Edward Mccoy  . Number of children: 1  . Years of education: 18  . Highest education level: Bachelor's degree (e.g., BA, AB, BS)  Occupational History  . 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 Smoking status: Never Smoker  . Smokeless tobacco: Never Used  Vaping Use  . Vaping Use: Never used  Substance and Sexual Activity  . Alcohol use: Yes    Alcohol/week: 0.0 standard drinks    Comment: occasionally- beer once a month  . Drug use: No  . Sexual activity: Yes    Birth control/protection: None  Other Topics Concern  .  Not on file  Social History Narrative  . Not on file   Social Determinants of Health   Financial Resource Strain: Low Risk   . Difficulty of Paying Living Expenses: Not hard at all  Food Insecurity: No Food Insecurity  . Worried About Charity fundraiser in the Last Year: Never true  . Ran Out of Food in the Last Year: Never true  Transportation Needs: No Transportation Needs  . Lack of Transportation (Medical): No  . Lack of Transportation (Non-Medical): No  Physical Activity: Sufficiently Active  . Days of Exercise per Week: 5 days  . Minutes of Exercise per Session: 30 min  Stress: No Stress Concern Present  . Feeling of Stress : Not at all  Social Connections: Moderately Isolated  . Frequency of Communication with Friends and Family: More than three times a week  . Frequency of Social Gatherings with Friends and Family: More than three times a week  . Attends Religious Services: Never  . Active Member of Clubs or Organizations: No  . Attends Archivist Meetings: Never  . Marital Status: Married    Tobacco Counseling Counseling given: Not Answered   Clinical Intake:  Pre-visit preparation completed: Yes  Pain : No/denies pain Pain Score: 0-No pain     Nutritional Status: BMI 25 -29 Overweight Nutritional Risks: None Diabetes: No  How often do you need to have someone help you when you read instructions, pamphlets, or other written materials from your doctor or pharmacy?: 1 - Never  Diabetic? No  Interpreter Needed?: No  Information entered by :: [facility], LPN   Activities of Daily Living In your present state of health, do you have any difficulty performing the following activities: 04/23/2020  Hearing? N  Vision? N  Difficulty concentrating or making decisions? N  Walking or climbing stairs? N  Dressing or bathing? N  Doing errands, shopping? N  Preparing Food and eating ? N  Using the Toilet? N  In the past six months, have you accidently  leaked urine? N  Do you have problems with loss of bowel control? N  Managing your Medications? N  Managing your Finances? N  Housekeeping or managing your Housekeeping? N  Some recent data might be hidden    Patient Care Team: [name]., MD as PCP - General (Family Medicine) [name], MD as Consulting Physician (Internal Medicine) [name], MD as Consulting Physician (Urology) [name], PA as Physician Assistant (Physician Assistant) [place], [place] (Optometry) Kellie Moor, Dayle Points, MD (Dermatology)  Indicate any recent Medical Services you may have received from other than [facility] providers in the past year (date may be approximate).     Assessment:   This is a routine  wellness examination for Edward Mccoy.  Hearing/Vision screen No exam data present  Dietary issues and exercise activities discussed: Current Exercise Habits: Home exercise routine, Type of exercise: walking;Other - see comments (or rides a stationary bike), Time (Minutes): 30, Frequency (Times/Week): 5, Weekly Exercise (Minutes/Week): 150, Intensity: Mild  Goals    . DIET - INCREASE WATER INTAKE     Recommend increasing water intake to 6-8 glasses a day.       Depression Screen PHQ 2/9 Scores 04/23/2020 09/08/2018 09/07/2017 09/07/2017 08/14/2016 08/14/2015  PHQ - 2 Score 0 0 0 0 0 0  PHQ- 9 Score - - 0 - - -    Fall Risk Fall Risk  04/23/2020 09/08/2018 09/07/2017 08/14/2016 08/14/2015  Falls in the past year? 0 0 No No No  Number falls in past yr: 0 - - - -  Injury with Fall? 0 - - - -    Any stairs in or around the home? Yes  If so, are there any without handrails? Yes  Home free of loose throw rugs in walkways, pet beds, electrical cords, etc? Yes  Adequate lighting in your home to reduce risk of falls? Yes   ASSISTIVE DEVICES UTILIZED TO PREVENT FALLS:  Life alert? No  Use of a cane, walker or w/c? No  Grab bars in the bathroom? No  Shower chair or bench in shower? No    Elevated toilet seat or a handicapped toilet? Yes    Cognitive Function:     6CIT Screen 04/23/2020 08/14/2016  What Year? 0 points 0 points  What month? 0 points 0 points  What time? 0 points 0 points  Count back from 20 0 points 0 points  Months in reverse 0 points 0 points  Repeat phrase 0 points 2 points  Total Score 0 2    Immunizations Immunization History  Administered Date(s) Administered  . Fluad Quad(high Dose 65+) 04/20/2019  . Influenza, High Dose Seasonal PF 08/14/2016, 06/16/2017, 06/09/2018, 04/13/2020  . Influenza,inj,Quad PF,6+ Mos 08/09/2013, 08/14/2015  . Pneumococcal Conjugate-13 08/14/2015  . Pneumococcal Polysaccharide-23 06/14/2017, 09/07/2017  . Td 01/03/2005  . Tdap 07/24/2011  . Zoster 08/09/2013  . Zoster Recombinat (Shingrix) 07/07/2019, 12/13/2019    TDAP status: Up to date Flu Vaccine status: Up to date Pneumococcal vaccine status: Up to date Covid-19 vaccine status: Completed vaccines  Qualifies for Shingles Vaccine? Yes   Zostavax completed Yes   Shingrix Completed?: Yes  Screening Tests Health Maintenance  Topic Date Due  . COVID-19 Vaccine (1) Never done  . TETANUS/TDAP  07/23/2021  . COLONOSCOPY  11/12/2021  . INFLUENZA VACCINE  Completed  . Hepatitis C Screening  Completed  . PNA vac Low Risk Adult  Completed    Health Maintenance  Health Maintenance Due  Topic Date Due  . COVID-19 Vaccine (1) Never done    Colorectal cancer screening: Completed 11/12/16. Repeat every 5 years  Lung Cancer Screening: (Low Dose CT Chest recommended if Age 1-80 years, 30 pack-year currently smoking OR have quit w/in 15years.) does not qualify.   Additional Screening:  Hepatitis C Screening: Up to date  Vision Screening: Recommended annual ophthalmology exams for early detection of glaucoma and other disorders of the eye. Is the patient up to date with their annual eye exam?  Yes  Who is the provider or what is the name of the office  in which the patient attends annual eye exams? [facility] If pt is not established with a provider, would they like  to be referred to a provider to establish care? No .   Dental Screening: Recommended annual dental exams for proper oral hygiene  Community Resource Referral / Chronic Care Management: CRR required this visit?  No   CCM required this visit?  No      Plan:     I have personally reviewed and noted the following in the patient's chart:   . Medical and social history . Use of alcohol, tobacco or illicit drugs  . Current medications and supplements . Functional ability and status . Nutritional status . Physical activity . Advanced directives . List of other physicians . Hospitalizations, surgeries, and ER visits in previous 12 months . Vitals . Screenings to include cognitive, depression, and falls . Referrals and appointments  In addition, I have reviewed and discussed with patient certain preventive protocols, quality metrics, and best practice recommendations. A written personalized care plan for preventive services as well as general preventive health recommendations were provided to patient.     Khari Mally [place], [place]   [id number]/[id number]   Nurse Notes: Requested Covid vaccine card information to update in chart.

## 2020-04-18 ENCOUNTER — Encounter: Payer: Self-pay | Admitting: Urology

## 2020-04-18 ENCOUNTER — Ambulatory Visit: Payer: PPO | Admitting: Urology

## 2020-04-18 ENCOUNTER — Other Ambulatory Visit: Payer: Self-pay

## 2020-04-18 VITALS — BP 116/72 | HR 65 | Ht 70.0 in | Wt 183.0 lb

## 2020-04-18 DIAGNOSIS — Z8546 Personal history of malignant neoplasm of prostate: Secondary | ICD-10-CM

## 2020-04-18 DIAGNOSIS — N5231 Erectile dysfunction following radical prostatectomy: Secondary | ICD-10-CM | POA: Diagnosis not present

## 2020-04-18 MED ORDER — SILDENAFIL CITRATE 20 MG PO TABS: ORAL_TABLET | ORAL | 3 refills | Status: DC

## 2020-04-18 NOTE — Progress Notes (Signed)
04/18/2020 11:31 AM   Edward Mccoy March 28, 1951 [id number]  Referring provider: [name]., MD [address] [place],  [place] [address]  Chief Complaint  Patient presents with  . Follow-up    Urologic history: 1. pT2cGleason 3+4 adenocarcinoma the prostate - RALP2/2008;PSA remains undetectable  2.Post prostatectomy erectile dysfunction -Doing well on Trimix/sildenafil   HPI: 69 y.o. male presents for annual follow-up.   Doing well and has no complaints  Denies bothersome LUTS  No dysuria, gross hematuria  Denies flank, abdominal or scrotal pain  Annual visit with Dr. Rosanna Randy next week and PSA to be drawn at that time   PMH: Past Medical History:  Diagnosis Date  . Cancer [facility]) 2008   prostate  . Cancer of appendix ([place]) 11/15/14   high-grade appendiceal mucinous neoplasm  . Colon polyp   . ED (erectile dysfunction)   . MRSA infection 05/2015   Right Kneee    Surgical History: Past Surgical History:  Procedure Laterality Date  . APPENDECTOMY  11/15/14   patient report it was cancerous   . COLONOSCOPY  07-09-06   Dr Allen Norris  . COLONOSCOPY WITH PROPOFOL N/A 11/12/2016   Procedure: COLONOSCOPY WITH PROPOFOL;  Surgeon: Christene Lye, MD;  Location: [facility] ENDOSCOPY;  Service: Endoscopy;  Laterality: N/A;  . PROSTATE SURGERY  June 2008  . SHOULDER SURGERY  Nov 1999, Oct 2015    Home Medications:  Allergies as of 04/18/2020   No Known Allergies     Medication List       Accurate as of April 18, 2020 11:31 AM. If you have any questions, ask your nurse or doctor.        aspirin EC 81 MG tablet Take 81 mg by mouth daily.   celecoxib 200 MG capsule Commonly known as: CELEBREX TAKE 1 CAPSULE BY MOUTH EVERY DAY AS NEEDED   Fish Oil 1200 MG Caps Take by mouth daily.   MULTIVITAMIN ADULT PO Take by mouth daily.   sildenafil 20 MG tablet Commonly known as: REVATIO 2-5 tabs 1 hour prior to intercourse     simvastatin 10 MG tablet Commonly known as: ZOCOR TAKE 1 TABLET BY MOUTH EVERYDAY AT BEDTIME   vitamin C 1000 MG tablet Take 1,000 mg by mouth daily.       Allergies: No Known Allergies  Family History: Family History  Problem Relation Age of Onset  . Cancer Mother        unknown type of ca  . Diabetes Father   . Parkinson's disease Father     Social History:  reports that he has never smoked. He has never used smokeless tobacco. He reports current alcohol use. He reports that he does not use drugs.   Physical Exam: BP 116/72   Pulse 65   Ht 5\' 10"  (1.778 m)   Wt 183 lb (83 kg)   BMI 26.26 kg/m   Constitutional:  Alert and oriented, No acute distress. HEENT: [place] AT, moist mucus membranes.  Trachea midline, no masses. Cardiovascular: No clubbing, cyanosis, or edema. Respiratory: Normal respiratory effort, no increased work of breathing. Neurologic: Grossly intact, no focal deficits, moving all 4 extremities. Psychiatric: Normal mood and affect.   Assessment & Plan:    1.  History prostate cancer  PSA to be drawn by PCP later this month  Continue annual follow-up  2.  Erectile dysfunction  Doing well on cavernosal injections/sildenafil  Trimix/sildenafil refilled   [name], MD  [facility]  [facility] [address], [place], [place] [address] [phone]

## 2020-04-20 NOTE — Progress Notes (Signed)
Edward Mccoy,acting as a scribe for Edward Durie, MD.,have documented all relevant documentation on the behalf of Edward Durie, MD,as directed by  Edward Durie, MD while in the presence of Edward Durie, MD.  Complete physical exam   Patient: Edward Mccoy   DOB: 25-Mar-1951   69 y.o. Male  MRN: [id number] Visit Date: 04/23/2020  Today's healthcare provider: [name], MD   Chief Complaint  Patient presents with   Annual Exam   Subjective    [name] is a 69 y.o. male who presents today for a complete physical exam.  He reports consuming a general diet. Home exercise routine includes walking or running. He generally feels well. He reports sleeping well. He does not have additional problems to discuss today.   HPI  Patient has a right knee effusion and right knee pain for which she undergoes an MRI in 6 days. He is followed by Edward Mccoy for prostate cancer. Followed by oncology for colon cancer. Patient had AWV with [facility] today at 1:20 pm.  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   Colon polyp    ED (erectile dysfunction)    Hyperlipidemia    MRSA infection 05/2015   Right Kneee   Past Surgical History:  Procedure Laterality Date   APPENDECTOMY  11/15/14   patient report it was cancerous    COLONOSCOPY  07-09-06   Edward Mccoy   COLONOSCOPY WITH PROPOFOL N/A 11/12/2016   Procedure: COLONOSCOPY WITH PROPOFOL;  Surgeon: Edward Lye, MD;  Location: [facility] ENDOSCOPY;  Service: Endoscopy;  Laterality: N/A;   PROSTATE SURGERY  June 2008   SHOULDER SURGERY  Nov 1999, Oct 2015   Social History   Socioeconomic History   Marital status: Married    Spouse name: [name]   Number of children: 1   Years of education: 16   Highest education level: Bachelor's degree (e.g., BA, AB, BS)  Occupational History    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  Tobacco Use   Smoking status: Never Smoker   Smokeless tobacco: Never Used  Vaping Use   Vaping Use: Never used  Substance and Sexual Activity   Alcohol use: Yes    Alcohol/week: 0.0 standard drinks    Comment: occasionally- beer once a month   Drug use: No   Sexual activity: Yes    Birth control/protection: None  Other Topics Concern   Not on file  Social History Narrative   Not on file   Social Determinants of Health   Financial Resource Strain: Low Risk    Difficulty of Paying Living Expenses: Not hard at all  Food Insecurity: No Food Insecurity   Worried About Charity fundraiser in the Last Year: Never true   [place] in the Last Year: Never true  Transportation Needs: No Transportation Needs   Lack of Transportation (Medical): No   Lack of Transportation (Non-Medical): No  Physical Activity: Sufficiently Active   Days of Exercise per Week: 5 days   Minutes of Exercise per Session: 30 min  Stress: No Stress Concern Present   Feeling of Stress : Not at all  Social Connections: Moderately Isolated   Frequency of Communication with Friends and Family: More than three times a week   Frequency of Social Gatherings with Friends and Family: More than three times a week  Attends Religious Services: Never   Active Member of Clubs or Organizations: No   Attends Archivist Meetings: Never   Marital Status: Married  Human resources officer Violence: Not At Risk   Fear of Current or Ex-Partner: No   Emotionally Abused: No   Physically Abused: No   Sexually Abused: No   Family Status  Relation Name Status   Mother  Alive   Father  Deceased   Family History  Problem Relation Age of Onset   Cancer Mother        unknown type of ca   Diabetes Father    Parkinson's disease Father    No Known Allergies  Patient Care Team: Edward Mccoy., MD as PCP - General (Family Medicine) Edward Sickle,  MD as Consulting Physician (Internal Medicine) Edward Sons, MD as Consulting Physician (Urology) Edward Climes, PA as Physician Assistant (Physician Assistant) [place], [place] (Optometry) Edward Rack, MD (Dermatology)   Medications: Outpatient Medications Prior to Visit  Medication Sig   Ascorbic Acid (VITAMIN C) 1000 MG tablet Take 1,000 mg by mouth daily.   aspirin EC 81 MG tablet Take 81 mg by mouth daily.   celecoxib (CELEBREX) 200 MG capsule TAKE 1 CAPSULE BY MOUTH EVERY DAY AS NEEDED   Coenzyme Q10 100 MG capsule Take 100 mg by mouth daily.   Multiple Vitamins-Minerals (MULTIVITAMIN ADULT PO) Take by mouth daily.   mupirocin ointment (BACTROBAN) 2 % Apply 1 application topically as directed. In nasal twice weekly.   NON FORMULARY Beet juice daily   Omega-3 Fatty Acids (FISH OIL) 1200 MG CAPS Take by mouth daily.    sildenafil (REVATIO) 20 MG tablet 2-5 tabs 1 hour prior to intercourse   simvastatin (ZOCOR) 10 MG tablet TAKE 1 TABLET BY MOUTH EVERYDAY AT BEDTIME   vitamin E 180 MG (400 UNITS) capsule Take 400 Units by mouth daily.   No facility-administered medications prior to visit.    Review of Systems  Constitutional: Negative.   HENT: Negative.   Eyes: Negative.   Respiratory: Negative.   Cardiovascular: Negative.   Gastrointestinal: Negative.   Endocrine: Negative.   Genitourinary: Negative.   Musculoskeletal: Negative.   Skin: Negative.   Allergic/Immunologic: Negative.   Neurological: Negative.   Hematological: Negative.   Psychiatric/Behavioral: Negative.        Objective    BP 122/70 (Patient Position: Sitting, Cuff Size: Normal)    Pulse 69    Temp 97.9 F (36.6 C) (Oral)    Ht 5\' 10"  (1.778 m)    Wt 187 lb 12.8 oz (85.2 kg)    SpO2 98%    BMI 26.95 kg/m  Wt Readings from Last 3 Encounters:  04/23/20 187 lb 12.8 oz (85.2 kg)  04/23/20 187 lb 12.8 oz (85.2 kg)  04/18/20 183 lb (83 kg)      Physical Exam Vitals reviewed.    Constitutional:      Appearance: Normal appearance.  HENT:     Head: Normocephalic and atraumatic.     Right Ear: Tympanic membrane, ear canal and external ear normal.     Left Ear: Tympanic membrane, ear canal and external ear normal.     Nose: Nose normal.     Mouth/Throat:     Mouth: Mucous membranes are dry.     Pharynx: Oropharynx is clear.  Eyes:     Extraocular Movements: Extraocular movements intact.     Conjunctiva/sclera: Conjunctivae normal.     Pupils:  Pupils are equal, round, and reactive to light.  Cardiovascular:     Rate and Rhythm: Regular rhythm. Bradycardia present.     Pulses: Normal pulses.     Heart sounds: Normal heart sounds.  Pulmonary:     Effort: Pulmonary effort is normal.     Breath sounds: Normal breath sounds.  Abdominal:     General: Abdomen is flat. Bowel sounds are normal.     Palpations: Abdomen is soft.  Genitourinary:    Penis: Normal.      Testes: Normal.  Musculoskeletal:        General: Normal range of motion.     Cervical back: Normal range of motion and neck supple.  Skin:    General: Skin is warm and dry.  Neurological:     General: No focal deficit present.     Mental Status: He is alert and oriented to person, place, and time. Mental status is at baseline.  Psychiatric:        Mood and Affect: Mood normal.        Behavior: Behavior normal.        Thought Content: Thought content normal.        Judgment: Judgment normal.       Last depression screening scores PHQ 2/9 Scores 04/23/2020 09/08/2018 09/07/2017  PHQ - 2 Score 0 0 0  PHQ- 9 Score - - 0   Last fall risk screening Fall Risk  04/23/2020  Falls in the past year? 0  Number falls in past yr: 0  Injury with Fall? 0   Last Audit-C alcohol use screening Alcohol Use Disorder Test (AUDIT) 04/23/2020  1. How often do you have a drink containing alcohol? 1  2. How many drinks containing alcohol do you have on a typical day when you are drinking? 0  3. How often do you  have six or more drinks on one occasion? 0  AUDIT-C Score 1  Alcohol Brief Interventions/Follow-up AUDIT Score <7 follow-up not indicated   A score of 3 or more in women, and 4 or more in men indicates increased risk for alcohol abuse, EXCEPT if all of the points are from question 1   No results found for any visits on 04/23/20.  Assessment & Plan    Routine Health Maintenance and Physical Exam  Exercise Activities and Dietary recommendations Goals     DIET - INCREASE WATER INTAKE     Recommend increasing water intake to 6-8 glasses a day.        Immunization History  Administered Date(s) Administered   Fluad Quad(high Dose 65+) 04/20/2019   Influenza, High Dose Seasonal PF 08/14/2016, 06/16/2017, 06/09/2018, 04/13/2020   Influenza,inj,Quad PF,6+ Mos 08/09/2013, 08/14/2015   Pneumococcal Conjugate-13 08/14/2015   Pneumococcal Polysaccharide-23 06/14/2017, 09/07/2017   Td 01/03/2005   Tdap 07/24/2011   Zoster 08/09/2013   Zoster Recombinat (Shingrix) 07/07/2019, 12/13/2019    Health Maintenance  Topic Date Due   COVID-19 Vaccine (1) Never done   TETANUS/TDAP  07/23/2021   COLONOSCOPY  11/12/2021   INFLUENZA VACCINE  Completed   Hepatitis C Screening  Completed   PNA vac Low Risk Adult  Completed    Discussed health benefits of physical activity, and encouraged him to engage in regular exercise appropriate for his age and condition.  1. Annual physical exam   2. History of prostate cancer  - PSA  3. Hyperlipidemia, unspecified hyperlipidemia type  - CBC with Differential/Platelet - Comprehensive metabolic panel - TSH - Lipid  panel  4. Screening for blood or protein in urine  - POCT urinalysis dipstick  5. Cancer of appendix [facility]) Per oncology.   No follow-ups on file.     I, Edward Durie, MD, have reviewed all documentation for this visit. The documentation on 04/26/20 for the exam, diagnosis, procedures, and orders are all  accurate and complete.    Richard Cranford Mon, MD  [facility] [phone] (phone) [phone] (fax)  [place]

## 2020-04-23 ENCOUNTER — Other Ambulatory Visit: Payer: Self-pay

## 2020-04-23 ENCOUNTER — Ambulatory Visit (INDEPENDENT_AMBULATORY_CARE_PROVIDER_SITE_OTHER): Payer: PPO | Admitting: Family Medicine

## 2020-04-23 ENCOUNTER — Ambulatory Visit (INDEPENDENT_AMBULATORY_CARE_PROVIDER_SITE_OTHER): Payer: PPO

## 2020-04-23 VITALS — BP 122/70 | HR 69 | Temp 97.9°F | Ht 70.0 in | Wt 187.8 lb

## 2020-04-23 DIAGNOSIS — Z0000 Encounter for general adult medical examination without abnormal findings: Secondary | ICD-10-CM | POA: Diagnosis not present

## 2020-04-23 DIAGNOSIS — E785 Hyperlipidemia, unspecified: Secondary | ICD-10-CM | POA: Diagnosis not present

## 2020-04-23 DIAGNOSIS — Z8546 Personal history of malignant neoplasm of prostate: Secondary | ICD-10-CM

## 2020-04-23 DIAGNOSIS — M2391 Unspecified internal derangement of right knee: Secondary | ICD-10-CM | POA: Diagnosis not present

## 2020-04-23 DIAGNOSIS — C181 Malignant neoplasm of appendix: Secondary | ICD-10-CM | POA: Diagnosis not present

## 2020-04-23 DIAGNOSIS — Z1389 Encounter for screening for other disorder: Secondary | ICD-10-CM | POA: Diagnosis not present

## 2020-04-23 LAB — POCT URINALYSIS DIPSTICK
Bilirubin, UA: NEGATIVE
Blood, UA: NEGATIVE
Glucose, UA: NEGATIVE
Ketones, UA: NEGATIVE
Leukocytes, UA: NEGATIVE
Nitrite, UA: NEGATIVE
Protein, UA: NEGATIVE
Spec Grav, UA: 1.015 (ref 1.010–1.025)
Urobilinogen, UA: 0.2 E.U./dL
pH, UA: 6 (ref 5.0–8.0)

## 2020-04-23 NOTE — Patient Instructions (Signed)
Edward Mccoy , Thank you for taking time to come for your Medicare Wellness Visit. I appreciate your ongoing commitment to your health goals. Please review the following plan we discussed and let me know if I can assist you in the future.   Screening recommendations/referrals: Colonoscopy: Up to date, due 11/2021 Recommended yearly ophthalmology/optometry visit for glaucoma screening and checkup Recommended yearly dental visit for hygiene and checkup  Vaccinations: Influenza vaccine: Completed 04/13/20 Pneumococcal vaccine: Completed series Tdap vaccine: Up to date, due 07/2021 Shingles vaccine: Completed series    Advanced directives: Currently on file.  Conditions/risks identified: Continue to increase water intake to 6-8 8 oz glasses a day.   Next appointment: 2:00 PM today with Dr Rosanna Randy   Preventive Care 48 Years and Older, Male Preventive care refers to lifestyle choices and visits with your health care provider that can promote health and wellness. What does preventive care include?  A yearly physical exam. This is also called an annual well check.  Dental exams once or twice a year.  Routine eye exams. Ask your health care provider how often you should have your eyes checked.  Personal lifestyle choices, including:  Daily care of your teeth and gums.  Regular physical activity.  Eating a healthy diet.  Avoiding tobacco and drug use.  Limiting alcohol use.  Practicing safe sex.  Taking low doses of aspirin every day.  Taking vitamin and mineral supplements as recommended by your health care provider. What happens during an annual well check? The services and screenings done by your health care provider during your annual well check will depend on your age, overall health, lifestyle risk factors, and family history of disease. Counseling  Your health care provider may ask you questions about your:  Alcohol use.  Tobacco use.  Drug use.  Emotional  well-being.  Home and relationship well-being.  Sexual activity.  Eating habits.  History of falls.  Memory and ability to understand (cognition).  Work and work Statistician. Screening  You may have the following tests or measurements:  Height, weight, and BMI.  Blood pressure.  Lipid and cholesterol levels. These may be checked every 5 years, or more frequently if you are over 75 years old.  Skin check.  Lung cancer screening. You may have this screening every year starting at age 2 if you have a 30-pack-year history of smoking and currently smoke or have quit within the past 15 years.  Fecal occult blood test (FOBT) of the stool. You may have this test every year starting at age 36.  Flexible sigmoidoscopy or colonoscopy. You may have a sigmoidoscopy every 5 years or a colonoscopy every 10 years starting at age 26.  Prostate cancer screening. Recommendations will vary depending on your family history and other risks.  Hepatitis C blood test.  Hepatitis B blood test.  Sexually transmitted disease (STD) testing.  Diabetes screening. This is done by checking your blood sugar (glucose) after you have not eaten for a while (fasting). You may have this done every 1-3 years.  Abdominal aortic aneurysm (AAA) screening. You may need this if you are a current or former smoker.  Osteoporosis. You may be screened starting at age 40 if you are at high risk. Talk with your health care provider about your test results, treatment options, and if necessary, the need for more tests. Vaccines  Your health care provider may recommend certain vaccines, such as:  Influenza vaccine. This is recommended every year.  Tetanus, diphtheria, and acellular  pertussis (Tdap, Td) vaccine. You may need a Td booster every 10 years.  Zoster vaccine. You may need this after age 19.  Pneumococcal 13-valent conjugate (PCV13) vaccine. One dose is recommended after age 42.  Pneumococcal  polysaccharide (PPSV23) vaccine. One dose is recommended after age 45. Talk to your health care provider about which screenings and vaccines you need and how often you need them. This information is not intended to replace advice given to you by your health care provider. Make sure you discuss any questions you have with your health care provider. Document Released: 08/17/2015 Document Revised: 04/09/2016 Document Reviewed: 05/22/2015 Elsevier Interactive Patient Education  2017 [place] Prevention in the Home Falls can cause injuries. They can happen to people of all ages. There are many things you can do to make your home safe and to help prevent falls. What can I do on the outside of my home?  Regularly fix the edges of walkways and driveways and fix any cracks.  Remove anything that might make you trip as you walk through a door, such as a raised step or threshold.  Trim any bushes or trees on the path to your home.  Use bright outdoor lighting.  Clear any walking paths of anything that might make someone trip, such as rocks or tools.  Regularly check to see if handrails are loose or broken. Make sure that both sides of any steps have handrails.  Any raised decks and porches should have guardrails on the edges.  Have any leaves, snow, or ice cleared regularly.  Use sand or salt on walking paths during winter.  Clean up any spills in your garage right away. This includes oil or grease spills. What can I do in the bathroom?  Use night lights.  Install grab bars by the toilet and in the tub and shower. Do not use towel bars as grab bars.  Use non-skid mats or decals in the tub or shower.  If you need to sit down in the shower, use a plastic, non-slip stool.  Keep the floor dry. Clean up any water that spills on the floor as soon as it happens.  Remove soap buildup in the tub or shower regularly.  Attach bath mats securely with double-sided non-slip rug  tape.  Do not have throw rugs and other things on the floor that can make you trip. What can I do in the bedroom?  Use night lights.  Make sure that you have a light by your bed that is easy to reach.  Do not use any sheets or blankets that are too big for your bed. They should not hang down onto the floor.  Have a firm chair that has side arms. You can use this for support while you get dressed.  Do not have throw rugs and other things on the floor that can make you trip. What can I do in the kitchen?  Clean up any spills right away.  Avoid walking on wet floors.  Keep items that you use a lot in easy-to-reach places.  If you need to reach something above you, use a strong step stool that has a grab bar.  Keep electrical cords out of the way.  Do not use floor polish or wax that makes floors slippery. If you must use wax, use non-skid floor wax.  Do not have throw rugs and other things on the floor that can make you trip. What can I do with my stairs?  Do not leave any items on the stairs.  Make sure that there are handrails on both sides of the stairs and use them. Fix handrails that are broken or loose. Make sure that handrails are as long as the stairways.  Check any carpeting to make sure that it is firmly attached to the stairs. Fix any carpet that is loose or worn.  Avoid having throw rugs at the top or bottom of the stairs. If you do have throw rugs, attach them to the floor with carpet tape.  Make sure that you have a light switch at the top of the stairs and the bottom of the stairs. If you do not have them, ask someone to add them for you. What else can I do to help prevent falls?  Wear shoes that:  Do not have high heels.  Have rubber bottoms.  Are comfortable and fit you well.  Are closed at the toe. Do not wear sandals.  If you use a stepladder:  Make sure that it is fully opened. Do not climb a closed stepladder.  Make sure that both sides of the  stepladder are locked into place.  Ask someone to hold it for you, if possible.  Clearly mark and make sure that you can see:  Any grab bars or handrails.  First and last steps.  Where the edge of each step is.  Use tools that help you move around (mobility aids) if they are needed. These include:  Canes.  Walkers.  Scooters.  Crutches.  Turn on the lights when you go into a dark area. Replace any light bulbs as soon as they burn out.  Set up your furniture so you have a clear path. Avoid moving your furniture around.  If any of your floors are uneven, fix them.  If there are any pets around you, be aware of where they are.  Review your medicines with your doctor. Some medicines can make you feel dizzy. This can increase your chance of falling. Ask your doctor what other things that you can do to help prevent falls. This information is not intended to replace advice given to you by your health care provider. Make sure you discuss any questions you have with your health care provider. Document Released: 05/17/2009 Document Revised: 12/27/2015 Document Reviewed: 08/25/2014 Elsevier Interactive Patient Education  2017 [employer].

## 2020-04-24 DIAGNOSIS — Z8546 Personal history of malignant neoplasm of prostate: Secondary | ICD-10-CM | POA: Diagnosis not present

## 2020-04-24 DIAGNOSIS — E785 Hyperlipidemia, unspecified: Secondary | ICD-10-CM | POA: Diagnosis not present

## 2020-04-25 LAB — CBC WITH DIFFERENTIAL/PLATELET
Basophils Absolute: 0 10*3/uL (ref 0.0–0.2)
Basos: 0 %
EOS (ABSOLUTE): 0.1 10*3/uL (ref 0.0–0.4)
Eos: 3 %
Hematocrit: 39.3 % (ref 37.5–51.0)
Hemoglobin: 12.8 g/dL — ABNORMAL LOW (ref 13.0–17.7)
Immature Grans (Abs): 0 10*3/uL (ref 0.0–0.1)
Immature Granulocytes: 0 %
Lymphocytes Absolute: 1.3 10*3/uL (ref 0.7–3.1)
Lymphs: 24 %
MCH: 30.7 pg (ref 26.6–33.0)
MCHC: 32.6 g/dL (ref 31.5–35.7)
MCV: 94 fL (ref 79–97)
Monocytes Absolute: 0.5 10*3/uL (ref 0.1–0.9)
Monocytes: 9 %
Neutrophils Absolute: 3.6 10*3/uL (ref 1.4–7.0)
Neutrophils: 64 %
Platelets: 229 10*3/uL (ref 150–450)
RBC: 4.17 x10E6/uL (ref 4.14–5.80)
RDW: 12.4 % (ref 11.6–15.4)
WBC: 5.7 10*3/uL (ref 3.4–10.8)

## 2020-04-25 LAB — COMPREHENSIVE METABOLIC PANEL
ALT: 32 IU/L (ref 0–44)
AST: 25 IU/L (ref 0–40)
Albumin/Globulin Ratio: 2.4 — ABNORMAL HIGH (ref 1.2–2.2)
Albumin: 4.3 g/dL (ref 3.8–4.8)
Alkaline Phosphatase: 38 IU/L — ABNORMAL LOW (ref 44–121)
BUN/Creatinine Ratio: 28 — ABNORMAL HIGH (ref 10–24)
BUN: 24 mg/dL (ref 8–27)
Bilirubin Total: 0.5 mg/dL (ref 0.0–1.2)
CO2: 24 mmol/L (ref 20–29)
Calcium: 8.8 mg/dL (ref 8.6–10.2)
Chloride: 103 mmol/L (ref 96–106)
Creatinine, Ser: 0.86 mg/dL (ref 0.76–1.27)
GFR calc Af Amer: 102 mL/min/{1.73_m2} (ref >59)
GFR calc non Af Amer: 88 mL/min/{1.73_m2} (ref >59)
Globulin, Total: 1.8 g/dL (ref 1.5–4.5)
Glucose: 96 mg/dL (ref 65–99)
Potassium: 4.5 mmol/L (ref 3.5–5.2)
Sodium: 140 mmol/L (ref 134–144)
Total Protein: 6.1 g/dL (ref 6.0–8.5)

## 2020-04-25 LAB — LIPID PANEL
Chol/HDL Ratio: 2.6 ratio (ref 0.0–5.0)
Cholesterol, Total: 164 mg/dL (ref 100–199)
HDL: 63 mg/dL (ref >39)
LDL Chol Calc (NIH): 87 mg/dL (ref 0–99)
Triglycerides: 72 mg/dL (ref 0–149)
VLDL Cholesterol Cal: 14 mg/dL (ref 5–40)

## 2020-04-25 LAB — PSA: Prostate Specific Ag, Serum: 0.1 ng/mL (ref 0.0–4.0)

## 2020-04-25 LAB — TSH: TSH: 1.43 u[IU]/mL (ref 0.450–4.500)

## 2020-04-29 ENCOUNTER — Other Ambulatory Visit: Payer: Self-pay

## 2020-04-29 ENCOUNTER — Ambulatory Visit
Admission: RE | Admit: 2020-04-29 | Discharge: 2020-04-29 | Disposition: A | Discharge status: Home / Self Care | Payer: PPO | Source: Ambulatory Visit | Attending: Physician Assistant | Admitting: Physician Assistant

## 2020-04-29 DIAGNOSIS — M2391 Unspecified internal derangement of right knee: Secondary | ICD-10-CM | POA: Insufficient documentation

## 2020-04-29 DIAGNOSIS — M25561 Pain in right knee: Secondary | ICD-10-CM | POA: Insufficient documentation

## 2020-04-30 DIAGNOSIS — M1711 Unilateral primary osteoarthritis, right knee: Secondary | ICD-10-CM | POA: Diagnosis not present

## 2020-05-16 DIAGNOSIS — M1711 Unilateral primary osteoarthritis, right knee: Secondary | ICD-10-CM | POA: Diagnosis not present

## 2020-05-18 DIAGNOSIS — Z85828 Personal history of other malignant neoplasm of skin: Secondary | ICD-10-CM | POA: Diagnosis not present

## 2020-05-18 DIAGNOSIS — Z08 Encounter for follow-up examination after completed treatment for malignant neoplasm: Secondary | ICD-10-CM | POA: Diagnosis not present

## 2020-05-18 DIAGNOSIS — X32XXXA Exposure to sunlight, initial encounter: Secondary | ICD-10-CM | POA: Diagnosis not present

## 2020-05-18 DIAGNOSIS — D225 Melanocytic nevi of trunk: Secondary | ICD-10-CM | POA: Diagnosis not present

## 2020-05-18 DIAGNOSIS — L570 Actinic keratosis: Secondary | ICD-10-CM | POA: Diagnosis not present

## 2020-05-18 DIAGNOSIS — L821 Other seborrheic keratosis: Secondary | ICD-10-CM | POA: Diagnosis not present

## 2020-06-20 ENCOUNTER — Other Ambulatory Visit: Payer: Self-pay | Admitting: Urology

## 2020-06-20 ENCOUNTER — Other Ambulatory Visit: Payer: Self-pay | Admitting: Family Medicine

## 2020-06-20 MED ORDER — CELECOXIB 200 MG PO CAPS: ORAL_CAPSULE | ORAL | 0 refills | Status: DC

## 2020-06-20 NOTE — Telephone Encounter (Signed)
Pt request refill  celecoxib (CELEBREX) 200 MG capsule  [id number] - [place], [place] Phone: [phone] Fax: [phone]

## 2020-08-27 ENCOUNTER — Other Ambulatory Visit: Payer: Self-pay | Admitting: Urology

## 2020-09-11 ENCOUNTER — Other Ambulatory Visit: Payer: Self-pay | Admitting: *Deleted

## 2020-09-11 MED ORDER — SILDENAFIL CITRATE 20 MG PO TABS: ORAL_TABLET | ORAL | 2 refills | Status: DC

## 2020-09-19 DIAGNOSIS — H2513 Age-related nuclear cataract, bilateral: Secondary | ICD-10-CM | POA: Diagnosis not present

## 2020-10-30 NOTE — Progress Notes (Signed)
This encounter was created in error - please disregard.

## 2020-11-08 ENCOUNTER — Ambulatory Visit: Payer: Self-pay | Admitting: *Deleted

## 2020-11-08 ENCOUNTER — Telehealth: Payer: Self-pay

## 2020-11-08 NOTE — Telephone Encounter (Signed)
Copied from [place] [phone]. Topic: General - Other >> Nov 08, 2020 11:26 AM Tessa Lerner A wrote: Reason for CRM: Patient has returned missed call from practice Agent saw no relevant encounters at the time of call Please contact to advise further when possible

## 2020-11-08 NOTE — Telephone Encounter (Signed)
Left message to call back  

## 2020-11-08 NOTE — Telephone Encounter (Signed)
Pt called in c/o having tingling or "something crawling under my skin"  In the left side of his face going down the left side of his neck into his shoulder and down into his left arm for over a month now.   It's intermittent and only lasts a few seconds at the time.   It doesn't keep him up at night or interfere with his daily activities which is why he said he waited this long before calling in.   "My wife told me I needed to be seen".   There are no appts available with Dr. Rosanna Randy until into May.   Pt wants to see Dr. Rosanna Randy.   "I've been seeing him for over 30 years".   "He knows me".    I let him know I would submit a note and see if they are able to work him in.   He was agreeable to this plan.   He can be reached on his cell at [phone].  I sent my notes to [facility].  Reason for Disposition . Neck pain (and neurologic deficit)  Answer Assessment - Initial Assessment Questions 1. SYMPTOM: "What is the main symptom you are concerned about?" (e.g., weakness, numbness)     Having tingling in left side of face down my neck into my shoulder and down my left arm.   Happens frequently during the day.   Left neck muscles feel sore or a nerve is pinched.  This happens laying down and standing up. 2. ONSET: "When did this start?" (minutes, hours, days; while sleeping)     At least several weeks, at least a month 3. LAST NORMAL: "When was the last time you were normal (no symptoms)?"     At least a month ago. 4. PATTERN "Does this come and go, or has it been constant since it started?"  "Is it present now?"     Intermittent.    It doesn't last long.   No injuries.    I've been playing golf but I don't know if it's related to that or not. Something is crawling under my skin. 5. CARDIAC SYMPTOMS: "Have you had any of the following symptoms: chest pain, difficulty breathing, palpitations?"     No cardiac problems 6. NEUROLOGIC SYMPTOMS: "Have you had any of the following symptoms:  headache, dizziness, vision loss, double vision, changes in speech, unsteady on your feet?"     No dizziness or headaches or vision.    7. OTHER SYMPTOMS: "Do you have any other symptoms?"     No other symptoms 8. PREGNANCY: "Is there any chance you are pregnant?" "When was your last menstrual period?"     N/A  Protocols used: NEUROLOGIC DEFICIT-A-AH

## 2020-11-08 NOTE — Telephone Encounter (Signed)
Advised patient that Dr. Darnell Level is out of the office. He is wanting to see him only and advised him that he does not have anything available next week. Patient requested that I send a message to see if he could be worked in. I advised that he would not get a response from you until Monday. Declined to be seen by another provider. FYI.

## 2020-11-12 NOTE — Telephone Encounter (Signed)
Apt scheduled. Pt advised.

## 2020-11-12 NOTE — Telephone Encounter (Signed)
It looks like I have an opening tomorrow at 120.

## 2020-11-13 ENCOUNTER — Encounter: Payer: Self-pay | Admitting: Family Medicine

## 2020-11-13 ENCOUNTER — Ambulatory Visit (INDEPENDENT_AMBULATORY_CARE_PROVIDER_SITE_OTHER): Payer: Medicare HMO | Admitting: Family Medicine

## 2020-11-13 ENCOUNTER — Other Ambulatory Visit: Payer: Self-pay

## 2020-11-13 ENCOUNTER — Ambulatory Visit
Admission: RE | Admit: 2020-11-13 | Discharge: 2020-11-13 | Disposition: A | Discharge status: Home / Self Care | Payer: Medicare HMO | Attending: Family Medicine | Admitting: Family Medicine

## 2020-11-13 ENCOUNTER — Ambulatory Visit
Admission: RE | Admit: 2020-11-13 | Discharge: 2020-11-13 | Disposition: A | Discharge status: Home / Self Care | Payer: Medicare HMO | Source: Ambulatory Visit | Attending: Family Medicine | Admitting: Family Medicine

## 2020-11-13 VITALS — BP 111/75 | HR 63 | Temp 98.7°F | Wt 193.0 lb

## 2020-11-13 DIAGNOSIS — M5412 Radiculopathy, cervical region: Secondary | ICD-10-CM

## 2020-11-13 DIAGNOSIS — C181 Malignant neoplasm of appendix: Secondary | ICD-10-CM | POA: Diagnosis not present

## 2020-11-13 DIAGNOSIS — R202 Paresthesia of skin: Secondary | ICD-10-CM

## 2020-11-13 DIAGNOSIS — R222 Localized swelling, mass and lump, trunk: Secondary | ICD-10-CM | POA: Diagnosis not present

## 2020-11-13 DIAGNOSIS — M4722 Other spondylosis with radiculopathy, cervical region: Secondary | ICD-10-CM | POA: Diagnosis not present

## 2020-11-13 DIAGNOSIS — Z8546 Personal history of malignant neoplasm of prostate: Secondary | ICD-10-CM | POA: Diagnosis not present

## 2020-11-13 DIAGNOSIS — M50121 Cervical disc disorder at C4-C5 level with radiculopathy: Secondary | ICD-10-CM | POA: Diagnosis not present

## 2020-11-13 NOTE — Progress Notes (Signed)
Established patient visit   Patient: Edward Mccoy   DOB: Oct 08, 1950   70 y.o. Male  MRN: [id number] Visit Date: 11/13/2020  Today's healthcare provider: [name], MD   No chief complaint on file.  Subjective    HPI  Pt called in c/o having tingling or "something crawling under my skin."  In the left side of his face going down the left side of his neck into his shoulder and down into his left arm for over a month now. It's intermittent and only lasts a few seconds at the time.   It doesn't keep him up at night or interfere with his daily activities There are no symptoms at all with walking or going up stairs.  Sensation is describing occurs around his left ear and the left preauricular area of the left neck and into the left upper shoulder.     Medications: Outpatient Medications Prior to Visit  Medication Sig  . Ascorbic Acid (VITAMIN C) 1000 MG tablet Take 1,000 mg by mouth daily.  Marland Kitchen aspirin EC 81 MG tablet Take 81 mg by mouth daily.  . celecoxib (CELEBREX) 200 MG capsule TAKE 1 CAPSULE BY MOUTH EVERY DAY AS NEEDED  . Coenzyme Q10 100 MG capsule Take 100 mg by mouth daily.  . Multiple Vitamins-Minerals (MULTIVITAMIN ADULT PO) Take by mouth daily.  . mupirocin ointment (BACTROBAN) 2 % Apply 1 application topically as directed. In nasal twice weekly.  . NON FORMULARY Beet juice daily  . Omega-3 Fatty Acids (FISH OIL) 1200 MG CAPS Take by mouth daily.   . sildenafil (REVATIO) 20 MG tablet 2-5 tabs 1 hour prior to intercourse  . simvastatin (ZOCOR) 10 MG tablet TAKE 1 TABLET BY MOUTH EVERYDAY AT BEDTIME  . vitamin E 180 MG (400 UNITS) capsule Take 400 Units by mouth daily.   No facility-administered medications prior to visit.    Review of Systems  Constitutional: Negative for appetite change, chills and fever.  Respiratory: Negative for chest tightness, shortness of breath and wheezing.   Cardiovascular: Negative for chest pain and palpitations.   Gastrointestinal: Negative for abdominal pain, nausea and vomiting.        Objective    BP 111/75 (BP Location: Left Arm, Patient Position: Sitting, Cuff Size: Normal)   Pulse 63   Temp 98.7 F (37.1 C) (Oral)   Wt 193 lb (87.5 kg)   SpO2 99%   BMI 27.69 kg/m  BP Readings from Last 3 Encounters:  11/13/20 111/75  04/23/20 122/70  04/23/20 122/70   Wt Readings from Last 3 Encounters:  11/13/20 193 lb (87.5 kg)  04/23/20 187 lb 12.8 oz (85.2 kg)  04/23/20 187 lb 12.8 oz (85.2 kg)       Physical Exam Vitals reviewed.  Constitutional:      Appearance: He is well-developed.  HENT:     Head: Normocephalic and atraumatic.     Right Ear: External ear normal.     Left Ear: External ear normal.     Nose: Nose normal.  Eyes:     Conjunctiva/sclera: Conjunctivae normal.     Pupils: Pupils are equal, round, and reactive to light.  Cardiovascular:     Rate and Rhythm: Normal rate and regular rhythm.     Heart sounds: Normal heart sounds.  Pulmonary:     Effort: Pulmonary effort is normal.     Breath sounds: Normal breath sounds.  Abdominal:     General: Bowel sounds are normal.  Palpations: Abdomen is soft.  Genitourinary:    Penis: Normal.      Prostate: Normal.     Rectum: Normal.  Musculoskeletal:     Cervical back: Normal range of motion and neck supple.  Skin:    General: Skin is warm and dry.  Neurological:     General: No focal deficit present.     Mental Status: He is alert and oriented to person, place, and time.     Comments: Equivocally  positive Spurling sign when turning head to the left  Psychiatric:        Behavior: Behavior normal.        Thought Content: Thought content normal.        Judgment: Judgment normal.       No results found for any visits on 11/13/20.  Assessment & Plan     1. Cervical radiculopathy  An x-ray and then will follow the symptoms.  Not think these are neurologic issues or cardiac. Watch for any rash. - DG  Cervical Spine Complete; Future  2. Tingling sensation   3. 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      I, [name], MD, have reviewed all documentation for this visit. The documentation on 11/19/20 for the exam, diagnosis, procedures, and orders are all accurate and complete.    Harlie Buening Cranford Mon, MD  [facility] [phone] (phone) [phone] (fax)  [place]

## 2020-11-15 ENCOUNTER — Telehealth: Payer: Self-pay

## 2020-11-15 DIAGNOSIS — R42 Dizziness and giddiness: Secondary | ICD-10-CM

## 2020-11-15 DIAGNOSIS — H9209 Otalgia, unspecified ear: Secondary | ICD-10-CM

## 2020-11-15 DIAGNOSIS — M5412 Radiculopathy, cervical region: Secondary | ICD-10-CM

## 2020-11-15 MED ORDER — PREDNISONE 10 MG (21) PO TBPK: ORAL_TABLET | ORAL | 0 refills | Status: AC

## 2020-11-15 NOTE — Telephone Encounter (Signed)
-----   Message from [name]., MD sent at 11/15/2020 10:38 AM EDT ----- Try prednisone 10 mg, 6-day taper, number 21 tablets.

## 2020-11-15 NOTE — Telephone Encounter (Signed)
Prescription sent to pharmacy, patient advised.

## 2020-11-15 NOTE — Telephone Encounter (Signed)
Order for referral placed. Patient advised.  

## 2020-11-15 NOTE — Telephone Encounter (Signed)
Copied from [place] [id number]. Topic: General - Other >> Nov 15, 2020 11:02 AM Edward Mccoy A wrote: Reason for CRM: Patient would like to be contacted by a member of clinical staff regarding ear discomfrot  Patient has spoken with Dr. Rosanna Randy previously about these problems but was uncertain if they would be referred to a facility to further treatment or should continue being helped by Dr. Rosanna Randy   Please contact to further advise when possible

## 2020-11-15 NOTE — Telephone Encounter (Signed)
See other message about this.

## 2020-11-15 NOTE — Telephone Encounter (Signed)
ENT would be the quicker referral.

## 2020-11-22 ENCOUNTER — Inpatient Hospital Stay (HOSPITAL_BASED_OUTPATIENT_CLINIC_OR_DEPARTMENT_OTHER): Payer: Medicare HMO | Admitting: Internal Medicine

## 2020-11-22 ENCOUNTER — Encounter: Payer: Self-pay | Admitting: Internal Medicine

## 2020-11-22 ENCOUNTER — Other Ambulatory Visit: Payer: Self-pay | Admitting: *Deleted

## 2020-11-22 ENCOUNTER — Inpatient Hospital Stay: Payer: Medicare HMO | Attending: Internal Medicine

## 2020-11-22 DIAGNOSIS — C181 Malignant neoplasm of appendix: Secondary | ICD-10-CM | POA: Diagnosis not present

## 2020-11-22 DIAGNOSIS — E785 Hyperlipidemia, unspecified: Secondary | ICD-10-CM | POA: Diagnosis not present

## 2020-11-22 DIAGNOSIS — Z8614 Personal history of Methicillin resistant Staphylococcus aureus infection: Secondary | ICD-10-CM | POA: Insufficient documentation

## 2020-11-22 DIAGNOSIS — Z7982 Long term (current) use of aspirin: Secondary | ICD-10-CM | POA: Diagnosis not present

## 2020-11-22 DIAGNOSIS — Z79899 Other long term (current) drug therapy: Secondary | ICD-10-CM | POA: Insufficient documentation

## 2020-11-22 DIAGNOSIS — Z86010 Personal history of colonic polyps: Secondary | ICD-10-CM | POA: Diagnosis not present

## 2020-11-22 DIAGNOSIS — Z8546 Personal history of malignant neoplasm of prostate: Secondary | ICD-10-CM | POA: Diagnosis not present

## 2020-11-22 DIAGNOSIS — Z8589 Personal history of malignant neoplasm of other organs and systems: Secondary | ICD-10-CM | POA: Insufficient documentation

## 2020-11-22 DIAGNOSIS — N529 Male erectile dysfunction, unspecified: Secondary | ICD-10-CM | POA: Diagnosis not present

## 2020-11-22 LAB — COMPREHENSIVE METABOLIC PANEL
ALT: 37 U/L (ref 0–44)
AST: 27 U/L (ref 15–41)
Albumin: 4 g/dL (ref 3.5–5.0)
Alkaline Phosphatase: 34 U/L — ABNORMAL LOW (ref 38–126)
Anion gap: 9 (ref 5–15)
BUN: 23 mg/dL (ref 8–23)
CO2: 26 mmol/L (ref 22–32)
Calcium: 9.1 mg/dL (ref 8.9–10.3)
Chloride: 102 mmol/L (ref 98–111)
Creatinine, Ser: 0.87 mg/dL (ref 0.61–1.24)
GFR, Estimated: >60 mL/min (ref >60)
Glucose, Bld: 83 mg/dL (ref 70–99)
Potassium: 4.8 mmol/L (ref 3.5–5.1)
Sodium: 137 mmol/L (ref 135–145)
Total Bilirubin: 1 mg/dL (ref 0.3–1.2)
Total Protein: 6.6 g/dL (ref 6.5–8.1)

## 2020-11-22 LAB — CBC WITH DIFFERENTIAL/PLATELET
Abs Immature Granulocytes: 0.02 10*3/uL (ref 0.00–0.07)
Basophils Absolute: 0 10*3/uL (ref 0.0–0.1)
Basophils Relative: 0 %
Eosinophils Absolute: 0.1 10*3/uL (ref 0.0–0.5)
Eosinophils Relative: 2 %
HCT: 42.2 % (ref 39.0–52.0)
Hemoglobin: 14.3 g/dL (ref 13.0–17.0)
Immature Granulocytes: 0 %
Lymphocytes Relative: 27 %
Lymphs Abs: 1.7 10*3/uL (ref 0.7–4.0)
MCH: 31.8 pg (ref 26.0–34.0)
MCHC: 33.9 g/dL (ref 30.0–36.0)
MCV: 94 fL (ref 80.0–100.0)
Monocytes Absolute: 0.6 10*3/uL (ref 0.1–1.0)
Monocytes Relative: 10 %
Neutro Abs: 3.8 10*3/uL (ref 1.7–7.7)
Neutrophils Relative %: 61 %
Platelets: 189 10*3/uL (ref 150–400)
RBC: 4.49 MIL/uL (ref 4.22–5.81)
RDW: 12.4 % (ref 11.5–15.5)
WBC: 6.2 10*3/uL (ref 4.0–10.5)
nRBC: 0 % (ref 0.0–0.2)

## 2020-11-22 NOTE — Progress Notes (Signed)
[facility] OFFICE PROGRESS NOTE  Patient Care Team: [name]., MD as PCP - General (Family Medicine) [name], MD as Consulting Physician (Internal Medicine) [name], MD as Consulting Physician (Urology) [name], PA as Physician Assistant (Physician Assistant) [place], [place] (Optometry) [name], MD (Dermatology)  Cancer Staging No matching staging information was found for the patient.   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  Dr.Sankar] last CT March 2018- NED [discussed not doing further surveillance scans]  # colo [Dr.sankar April 2018- repeat colo in 5 years]  # Prostate cancer 2008 s/p Prostatectomy [Dr.Stoiff].    Malignant neoplasm of appendix [facility])      INTERVAL HISTORY:  Edward Mccoy 70 y.o.  male pleasant patient above history of carcinoma of the appendix stage I and history of prostate cancer is here for follow-up.  Patient denies any nausea vomiting abdominal pain.  Denies any diarrhea.  Denies any blood in stools or black or stools.   Review of Systems  Constitutional: Negative for chills, diaphoresis, fever, malaise/fatigue and weight loss.  HENT: Negative for nosebleeds and sore throat.   Eyes: Negative for double vision.  Respiratory: Negative for cough, hemoptysis, sputum production, shortness of breath and wheezing.   Cardiovascular: Negative for chest pain, palpitations, orthopnea and leg swelling.  Gastrointestinal: Negative for blood in stool, constipation, diarrhea, heartburn, melena, nausea and vomiting.  Genitourinary: Negative for dysuria, frequency and urgency.  Musculoskeletal: Negative for back pain and joint pain.  Skin: Negative.  Negative for itching and rash.  Neurological: Negative for dizziness, tingling, focal  weakness, weakness and headaches.  Endo/Heme/Allergies: Does not bruise/bleed easily.  Psychiatric/Behavioral: Negative for depression. The patient is not nervous/anxious and does not have insomnia.       PAST MEDICAL HISTORY :  Past Medical History:  Diagnosis Date  . Cancer [facility]) 2008   prostate  . Cancer of appendix ([place]) 11/15/14   high-grade appendiceal mucinous neoplasm  . Colon polyp   . ED (erectile dysfunction)   . Hyperlipidemia   . MRSA infection 05/2015   Right Kneee    PAST SURGICAL HISTORY :   Past Surgical History:  Procedure Laterality Date  . APPENDECTOMY  11/15/14   patient report it was cancerous   . COLONOSCOPY  07-09-06   Dr Allen Norris  . COLONOSCOPY WITH PROPOFOL N/A 11/12/2016   Procedure: COLONOSCOPY WITH PROPOFOL;  Surgeon: Christene Lye, MD;  Location: [facility] ENDOSCOPY;  Service: Endoscopy;  Laterality: N/A;  . PROSTATE SURGERY  June 2008  . SHOULDER SURGERY  Nov 1999, Oct 2015    FAMILY HISTORY :   Family History  Problem Relation Age of Onset  . Cancer Mother        unknown type of ca  . Diabetes Father   . Parkinson's disease Father     SOCIAL HISTORY:   Social History   Tobacco Use  . Smoking status: Never Smoker  . Smokeless tobacco: Never Used  Vaping Use  . Vaping Use: Never used  Substance Use Topics  . Alcohol use: Yes    Alcohol/week: 0.0 standard drinks    Comment: occasionally- beer once a month  . Drug use: No    ALLERGIES:  has No Known Allergies.  MEDICATIONS:  Current Outpatient Medications  Medication Sig Dispense Refill  . Ascorbic Acid (VITAMIN  C) 1000 MG tablet Take 1,000 mg by mouth daily.    Marland Kitchen aspirin EC 81 MG tablet Take 81 mg by mouth daily.    . celecoxib (CELEBREX) 200 MG capsule TAKE 1 CAPSULE BY MOUTH EVERY DAY AS NEEDED 90 capsule 0  . Coenzyme Q10 100 MG capsule Take 100 mg by mouth daily.    . Multiple Vitamins-Minerals (MULTIVITAMIN ADULT PO) Take by mouth daily.    . mupirocin ointment  (BACTROBAN) 2 % Apply 1 application topically as directed. In nasal twice weekly.    . NON FORMULARY Beet juice daily    . Omega-3 Fatty Acids (FISH OIL) 1200 MG CAPS Take by mouth daily.     . sildenafil (REVATIO) 20 MG tablet 2-5 tabs 1 hour prior to intercourse 90 tablet 2  . simvastatin (ZOCOR) 10 MG tablet TAKE 1 TABLET BY MOUTH EVERYDAY AT BEDTIME 90 tablet 3  . vitamin E 180 MG (400 UNITS) capsule Take 400 Units by mouth daily.     No current facility-administered medications for this visit.    PHYSICAL EXAMINATION: ECOG PERFORMANCE STATUS: 0 - Asymptomatic  BP 128/83 (BP Location: Left Arm, Patient Position: Sitting, Cuff Size: Normal)   Pulse 68   Temp (!) 97.5 F (36.4 C) (Tympanic)   Resp 16   Ht 5\' 10"  (1.778 m)   Wt 193 lb (87.5 kg)   SpO2 99%   BMI 27.69 kg/m   Filed Weights   11/22/20 1013  Weight: 193 lb (87.5 kg)    Physical Exam HENT:     Head: Normocephalic and atraumatic.     Mouth/Throat:     Pharynx: No oropharyngeal exudate.  Eyes:     Pupils: Pupils are equal, round, and reactive to light.  Cardiovascular:     Rate and Rhythm: Normal rate and regular rhythm.  Pulmonary:     Effort: Pulmonary effort is normal. No respiratory distress.     Breath sounds: Normal breath sounds. No wheezing.  Abdominal:     General: Bowel sounds are normal. There is no distension.     Palpations: Abdomen is soft. There is no mass.     Tenderness: There is no abdominal tenderness. There is no guarding or rebound.  Musculoskeletal:        General: No tenderness. Normal range of motion.     Cervical back: Normal range of motion and neck supple.  Skin:    General: Skin is warm.  Neurological:     Mental Status: He is alert and oriented to person, place, and time.  Psychiatric:        Mood and Affect: Affect normal.     LABORATORY DATA:  I have reviewed the data as listed    Component Value Date/Time   NA 137 11/22/2020 1005   NA 140 04/24/2020 0953   NA  139 11/13/2014 1553   K 4.8 11/22/2020 1005   K 3.9 11/13/2014 1553   CL 102 11/22/2020 1005   CL 104 11/13/2014 1553   CO2 26 11/22/2020 1005   CO2 28 11/13/2014 1553   GLUCOSE 83 11/22/2020 1005   GLUCOSE 93 11/13/2014 1553   BUN 23 11/22/2020 1005   BUN 24 04/24/2020 0953   BUN 18 11/13/2014 1553   CREATININE 0.87 11/22/2020 1005   CREATININE 1.02 11/13/2014 1553   CALCIUM 9.1 11/22/2020 1005   CALCIUM 9.1 11/13/2014 1553   PROT 6.6 11/22/2020 1005   PROT 6.1 04/24/2020 0953   PROT 7.3 11/13/2014 1553  ALBUMIN 4.0 11/22/2020 1005   ALBUMIN 4.3 04/24/2020 0953   ALBUMIN 4.6 11/13/2014 1553   AST 27 11/22/2020 1005   AST 30 11/13/2014 1553   ALT 37 11/22/2020 1005   ALT 34 11/13/2014 1553   ALKPHOS 34 (L) 11/22/2020 1005   ALKPHOS 39 11/13/2014 1553   BILITOT 1.0 11/22/2020 1005   BILITOT 0.5 04/24/2020 0953   BILITOT 0.9 11/13/2014 1553   GFRNONAA >60 11/22/2020 1005   GFRNONAA >60 11/13/2014 1553   GFRAA 102 04/24/2020 0953   GFRAA >60 11/13/2014 1553    No results found for: SPEP, UPEP  Lab Results  Component Value Date   WBC 6.2 11/22/2020   NEUTROABS 3.8 11/22/2020   HGB 14.3 11/22/2020   HCT 42.2 11/22/2020   MCV 94.0 11/22/2020   PLT 189 11/22/2020      Chemistry      Component Value Date/Time   NA 137 11/22/2020 1005   NA 140 04/24/2020 0953   NA 139 11/13/2014 1553   K 4.8 11/22/2020 1005   K 3.9 11/13/2014 1553   CL 102 11/22/2020 1005   CL 104 11/13/2014 1553   CO2 26 11/22/2020 1005   CO2 28 11/13/2014 1553   BUN 23 11/22/2020 1005   BUN 24 04/24/2020 0953   BUN 18 11/13/2014 1553   CREATININE 0.87 11/22/2020 1005   CREATININE 1.02 11/13/2014 1553   GLU 102 08/10/2014 0000      Component Value Date/Time   CALCIUM 9.1 11/22/2020 1005   CALCIUM 9.1 11/13/2014 1553   ALKPHOS 34 (L) 11/22/2020 1005   ALKPHOS 39 11/13/2014 1553   AST 27 11/22/2020 1005   AST 30 11/13/2014 1553   ALT 37 11/22/2020 1005   ALT 34 11/13/2014 1553    BILITOT 1.0 11/22/2020 1005   BILITOT 0.5 04/24/2020 0953   BILITOT 0.9 11/13/2014 1553       RADIOGRAPHIC STUDIES: I have personally reviewed the radiological images as listed and agreed with the findings in the report. No results found.   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 APRIL 2021- CT scan of the abdomen pelvis- NED.  Stable.  # Prostate cancer-clinically no evidence of recurrence; PSA-SEP 2020--undetectable.  Continue follow-up with urology [Dr.Stoiff].  STABLE.  # Discussed re: follow up with [place] versus following up with PCP.  Patient feels comfortable following up with PCP.  I would not recommend any further tumor markers or imaging unless clinically indicated.  # DISPOSITION:  # Follow-up as needed- Dr.B    No orders of the defined types were placed in this encounter.  All questions were answered. The patient knows to call the clinic with any problems, questions or concerns.      [name], MD 11/22/2020 11:21 AM

## 2020-11-22 NOTE — Assessment & Plan Note (Addendum)
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 APRIL 2021- CT scan of the abdomen pelvis- NED.  Stable.  # Prostate cancer-clinically no evidence of recurrence; PSA-SEP 2020--undetectable.  Continue follow-up with urology [Dr.Stoiff].  STABLE.  # Discussed re: follow up with [place] versus following up with PCP.  Patient feels comfortable following up with PCP.  I would not recommend any further tumor markers or imaging unless clinically indicated.  # DISPOSITION:  # Follow-up as needed- Dr.B

## 2020-11-23 LAB — CEA: CEA: 1 ng/mL (ref 0.0–4.7)

## 2020-11-29 DIAGNOSIS — R42 Dizziness and giddiness: Secondary | ICD-10-CM | POA: Diagnosis not present

## 2020-11-29 DIAGNOSIS — H8112 Benign paroxysmal vertigo, left ear: Secondary | ICD-10-CM | POA: Diagnosis not present

## 2020-12-06 DIAGNOSIS — H8111 Benign paroxysmal vertigo, right ear: Secondary | ICD-10-CM | POA: Diagnosis not present

## 2020-12-14 ENCOUNTER — Other Ambulatory Visit: Payer: Self-pay | Admitting: Family Medicine

## 2020-12-14 DIAGNOSIS — E7800 Pure hypercholesterolemia, unspecified: Secondary | ICD-10-CM

## 2020-12-14 NOTE — Telephone Encounter (Signed)
Future visit in 4 months 

## 2021-02-15 DIAGNOSIS — D0471 Carcinoma in situ of skin of right lower limb, including hip: Secondary | ICD-10-CM | POA: Diagnosis not present

## 2021-02-15 DIAGNOSIS — D485 Neoplasm of uncertain behavior of skin: Secondary | ICD-10-CM | POA: Diagnosis not present

## 2021-02-15 DIAGNOSIS — L821 Other seborrheic keratosis: Secondary | ICD-10-CM | POA: Diagnosis not present

## 2021-02-15 DIAGNOSIS — Z85828 Personal history of other malignant neoplasm of skin: Secondary | ICD-10-CM | POA: Diagnosis not present

## 2021-02-15 DIAGNOSIS — D2261 Melanocytic nevi of right upper limb, including shoulder: Secondary | ICD-10-CM | POA: Diagnosis not present

## 2021-02-15 DIAGNOSIS — D2272 Melanocytic nevi of left lower limb, including hip: Secondary | ICD-10-CM | POA: Diagnosis not present

## 2021-02-15 DIAGNOSIS — D2262 Melanocytic nevi of left upper limb, including shoulder: Secondary | ICD-10-CM | POA: Diagnosis not present

## 2021-02-28 DIAGNOSIS — D0471 Carcinoma in situ of skin of right lower limb, including hip: Secondary | ICD-10-CM | POA: Diagnosis not present

## 2021-03-14 ENCOUNTER — Other Ambulatory Visit: Payer: Self-pay | Admitting: Family Medicine

## 2021-03-14 NOTE — Telephone Encounter (Signed)
Requested Prescriptions  Pending Prescriptions Disp Refills  . celecoxib (CELEBREX) 200 MG capsule [Pharmacy Med Name: CELECOXIB 200 MG CAPSULE] 90 capsule 0    Sig: TAKE 1 CAPSULE BY MOUTH EVERY DAY AS NEEDED     Analgesics:  COX2 Inhibitors Passed - 03/14/2021  1:42 AM      Passed - HGB in normal range and within 360 days    Hemoglobin  Date Value Ref Range Status  11/22/2020 14.3 13.0 - 17.0 g/dL Final  04/24/2020 12.8 (L) 13.0 - 17.7 g/dL Final         Passed - Cr in normal range and within 360 days    Creatinine  Date Value Ref Range Status  11/13/2014 1.02 mg/dL Final    Comment:    0.61-1.24 NOTE: New Reference Range  10/10/14    Creatinine, Ser  Date Value Ref Range Status  11/22/2020 0.87 0.61 - 1.24 mg/dL Final         Passed - Patient is not pregnant      Passed - Valid encounter within last 12 months    Recent Outpatient Visits          4 months ago Cervical radiculopathy   [facility] [name]., MD   10 months ago Annual physical exam   [facility] [name]., MD   1 year ago Right lower quadrant abdominal pain   [facility] [name]., MD   1 year ago Annual physical exam   [facility] [name]., MD   1 year ago Fever, unspecified fever cause   [facility] Birdie Sons, MD      Future Appointments            In 1 month Stoioff, Ronda Fairly, MD [place]   In 1 month [name]., MD [facility], [place]

## 2021-03-18 ENCOUNTER — Other Ambulatory Visit: Payer: Self-pay | Admitting: Family Medicine

## 2021-03-18 MED ORDER — SILDENAFIL CITRATE 20 MG PO TABS: ORAL_TABLET | ORAL | 0 refills | Status: DC

## 2021-04-18 ENCOUNTER — Other Ambulatory Visit: Payer: Self-pay | Admitting: Family Medicine

## 2021-04-18 ENCOUNTER — Ambulatory Visit: Payer: Self-pay | Admitting: Urology

## 2021-04-18 ENCOUNTER — Other Ambulatory Visit: Payer: Self-pay

## 2021-04-18 ENCOUNTER — Encounter: Payer: Self-pay | Admitting: Urology

## 2021-04-18 ENCOUNTER — Ambulatory Visit (INDEPENDENT_AMBULATORY_CARE_PROVIDER_SITE_OTHER): Payer: Medicare HMO | Admitting: Urology

## 2021-04-18 VITALS — BP 130/72 | HR 68 | Ht 72.0 in | Wt 200.0 lb

## 2021-04-18 DIAGNOSIS — N5231 Erectile dysfunction following radical prostatectomy: Secondary | ICD-10-CM

## 2021-04-18 DIAGNOSIS — Z8546 Personal history of malignant neoplasm of prostate: Secondary | ICD-10-CM

## 2021-04-18 MED ORDER — AMBULATORY NON FORMULARY MEDICATION: 6 refills | Status: DC

## 2021-04-18 MED ORDER — SILDENAFIL CITRATE 20 MG PO TABS: 20.0000 mg | ORAL_TABLET | Freq: Every day | ORAL | 3 refills | Status: DC

## 2021-04-18 NOTE — Progress Notes (Signed)
04/18/2021 12:48 PM   Edward Mccoy 07/07/1951 [id number]  Referring provider: [name]., MD [address] [place],  [place] [address]  No chief complaint on file.   Urologic history: 1. pT2c Gleason 3+4 adenocarcinoma the prostate - RALP 09/2006; PSA remains undetectable   2.  Post prostatectomy erectile dysfunction -Doing well on Trimix/sildenafil   HPI: 70 y.o. male presents for annual follow-up.  Doing well since last visit No bothersome LUTS Denies dysuria, gross hematuria Denies flank, abdominal or pelvic pain PSA scheduled with Dr. Rosanna Randy later this month   PMH: Past Medical History:  Diagnosis Date   Cancer [facility]) 2008   prostate   Cancer of appendix ([place]) 11/15/14   high-grade appendiceal mucinous neoplasm   Colon polyp    ED (erectile dysfunction)    Hyperlipidemia    MRSA infection 05/2015   Right Kneee    Surgical History: Past Surgical History:  Procedure Laterality Date   APPENDECTOMY  11/15/14   patient report it was cancerous    COLONOSCOPY  07-09-06   Dr Allen Norris   COLONOSCOPY WITH PROPOFOL N/A 11/12/2016   Procedure: COLONOSCOPY WITH PROPOFOL;  Surgeon: Christene Lye, MD;  Location: [facility] ENDOSCOPY;  Service: Endoscopy;  Laterality: N/A;   PROSTATE SURGERY  June 2008   SHOULDER SURGERY  Nov 1999, Oct 2015    Home Medications:  Allergies as of 04/18/2021   No Known Allergies      Medication List        Accurate as of April 18, 2021 12:48 PM. If you have any questions, ask your nurse or doctor.          aspirin EC 81 MG tablet Take 81 mg by mouth daily.   celecoxib 200 MG capsule Commonly known as: CELEBREX TAKE 1 CAPSULE BY MOUTH EVERY DAY AS NEEDED   Coenzyme Q10 100 MG capsule Take 100 mg by mouth daily.   Fish Oil 1200 MG Caps Take by mouth daily.   MULTIVITAMIN ADULT PO Take by mouth daily.   mupirocin ointment 2 % Commonly known as: BACTROBAN Apply 1 application topically as  directed. In nasal twice weekly.   NON FORMULARY Beet juice daily   sildenafil 20 MG tablet Commonly known as: REVATIO 2-5 tabs 1 hour prior to intercourse   simvastatin 10 MG tablet Commonly known as: ZOCOR TAKE 1 TABLET BY MOUTH EVERYDAY AT BEDTIME   vitamin C 1000 MG tablet Take 1,000 mg by mouth daily.   vitamin E 180 MG (400 UNITS) capsule Take 400 Units by mouth daily.        Allergies: No Known Allergies  Family History: Family History  Problem Relation Age of Onset   Cancer Mother        unknown type of ca   Diabetes Father    Parkinson's disease Father     Social History:  reports that he has never smoked. He has never used smokeless tobacco. He reports current alcohol use. He reports that he does not use drugs.   Physical Exam: There were no vitals taken for this visit.  Constitutional:  Alert and oriented, No acute distress. HEENT: [place] AT, moist mucus membranes.  Trachea midline, no masses. Cardiovascular: No clubbing, cyanosis, or edema. Respiratory: Normal respiratory effort, no increased work of breathing. GI: Abdomen is soft, nontender, nondistended, no abdominal masses Neurologic: Grossly intact, no focal deficits, moving all 4 extremities. Psychiatric: Normal mood and affect.   Assessment & Plan:  1.  History prostate cancer PSA to be drawn by PCP later this month Continue annual follow-up   2.  Erectile dysfunction Doing well on cavernosal injections/sildenafil Trimix/sildenafil refilled    [name], MD  [facility] [address], [place], [place] [address] [phone]

## 2021-04-29 ENCOUNTER — Other Ambulatory Visit: Payer: Self-pay

## 2021-04-29 ENCOUNTER — Ambulatory Visit (INDEPENDENT_AMBULATORY_CARE_PROVIDER_SITE_OTHER): Payer: Medicare HMO | Admitting: Family Medicine

## 2021-04-29 ENCOUNTER — Encounter: Payer: Self-pay | Admitting: Family Medicine

## 2021-04-29 VITALS — BP 115/65 | HR 62 | Temp 98.3°F | Resp 16 | Ht 70.0 in | Wt 187.0 lb

## 2021-04-29 DIAGNOSIS — Z130 Encounter for screening for diseases of the blood and blood-forming organs and certain disorders involving the immune mechanism: Secondary | ICD-10-CM | POA: Diagnosis not present

## 2021-04-29 DIAGNOSIS — M5412 Radiculopathy, cervical region: Secondary | ICD-10-CM | POA: Diagnosis not present

## 2021-04-29 DIAGNOSIS — C181 Malignant neoplasm of appendix: Secondary | ICD-10-CM

## 2021-04-29 DIAGNOSIS — Z8546 Personal history of malignant neoplasm of prostate: Secondary | ICD-10-CM

## 2021-04-29 DIAGNOSIS — Z0000 Encounter for general adult medical examination without abnormal findings: Secondary | ICD-10-CM | POA: Diagnosis not present

## 2021-04-29 DIAGNOSIS — Z1211 Encounter for screening for malignant neoplasm of colon: Secondary | ICD-10-CM | POA: Diagnosis not present

## 2021-04-29 DIAGNOSIS — Z23 Encounter for immunization: Secondary | ICD-10-CM

## 2021-04-29 DIAGNOSIS — Z13220 Encounter for screening for lipoid disorders: Secondary | ICD-10-CM | POA: Diagnosis not present

## 2021-04-29 DIAGNOSIS — Z13228 Encounter for screening for other metabolic disorders: Secondary | ICD-10-CM | POA: Diagnosis not present

## 2021-04-29 DIAGNOSIS — Z136 Encounter for screening for cardiovascular disorders: Secondary | ICD-10-CM | POA: Diagnosis not present

## 2021-04-29 DIAGNOSIS — E785 Hyperlipidemia, unspecified: Secondary | ICD-10-CM

## 2021-04-29 DIAGNOSIS — Z1329 Encounter for screening for other suspected endocrine disorder: Secondary | ICD-10-CM | POA: Diagnosis not present

## 2021-04-29 NOTE — Progress Notes (Signed)
Annual Wellness Visit     Patient: Edward Mccoy, Male    DOB: 1950/12/24, 70 y.o.   MRN: [id number] Visit Date: 04/29/2021  Today's Provider: [name], MD   Chief Complaint  Patient presents with   Annual Exam   Subjective    Edward Mccoy is a 70 y.o. male who presents today for his Annual Wellness Visit.Annual Physical He reports consuming a general diet. Home exercise routine includes walking. He generally feels well. He reports sleeping well. He does not have additional problems to discuss today.  No issues. Medications: Outpatient Medications Prior to Visit  Medication Sig   AMBULATORY NON FORMULARY MEDICATION Trimix (30/1/10)-(Pap/Phent/PGE)  Dosage: Inject 0.5-1 cc per injection   Vial 73ml  Qty #10 Refills 6  [place] [phone] Fax [phone]   Ascorbic Acid (VITAMIN C) 1000 MG tablet Take 1,000 mg by mouth daily.   aspirin EC 81 MG tablet Take 81 mg by mouth daily.   celecoxib (CELEBREX) 200 MG capsule TAKE 1 CAPSULE BY MOUTH EVERY DAY AS NEEDED   Coenzyme Q10 100 MG capsule Take 100 mg by mouth daily.   Multiple Vitamins-Minerals (MULTIVITAMIN ADULT PO) Take by mouth daily.   mupirocin ointment (BACTROBAN) 2 % Apply 1 application topically as directed. In nasal twice weekly.   NON FORMULARY Beet juice daily   Omega-3 Fatty Acids (FISH OIL) 1200 MG CAPS Take by mouth daily.    sildenafil (REVATIO) 20 MG tablet Take 1 tablet (20 mg total) by mouth daily.   simvastatin (ZOCOR) 10 MG tablet TAKE 1 TABLET BY MOUTH EVERYDAY AT BEDTIME   vitamin E 180 MG (400 UNITS) capsule Take 400 Units by mouth daily.   No facility-administered medications prior to visit.    No Known Allergies  Patient Care Team: [name]., MD as PCP - General (Family Medicine) [name], MD as Consulting Physician (Internal Medicine) [name], MD as Consulting Physician (Urology) [name], PA as Physician Assistant  (Physician Assistant) [place], [place] (Optometry) Kellie Moor, Dayle Points, MD (Dermatology)  Review of Systems  All other systems reviewed and are negative.       Objective    Vitals: BP 115/65   Pulse 62   Temp 98.3 F (36.8 C)   Resp 16   Ht 5\' 10"  (1.778 m)   Wt 187 lb (84.8 kg)   BMI 26.83 kg/m  BP Readings from Last 3 Encounters:  04/29/21 115/65  04/18/21 130/72  11/22/20 128/83   Wt Readings from Last 3 Encounters:  04/29/21 187 lb (84.8 kg)  04/18/21 200 lb (90.7 kg)  11/22/20 193 lb (87.5 kg)      Physical Exam Vitals reviewed.  Constitutional:      Appearance: Normal appearance.  HENT:     Head: Normocephalic and atraumatic.     Right Ear: Tympanic membrane, ear canal and external ear normal.     Left Ear: Tympanic membrane, ear canal and external ear normal.     Nose: Nose normal.     Mouth/Throat:     Mouth: Mucous membranes are dry.     Pharynx: Oropharynx is clear.  Eyes:     Extraocular Movements: Extraocular movements intact.     Conjunctiva/sclera: Conjunctivae normal.     Pupils: Pupils are equal, round, and reactive to light.  Cardiovascular:     Rate and Rhythm: Regular rhythm. Bradycardia present.     Pulses: Normal pulses.  Heart sounds: Normal heart sounds.  Pulmonary:     Effort: Pulmonary effort is normal.     Breath sounds: Normal breath sounds.  Abdominal:     General: Bowel sounds are normal.     Palpations: Abdomen is soft.  Genitourinary:    Penis: Normal.      Testes: Normal.  Musculoskeletal:        General: Normal range of motion.     Cervical back: Normal range of motion and neck supple.  Skin:    General: Skin is warm and dry.  Neurological:     General: No focal deficit present.     Mental Status: He is alert and oriented to person, place, and time.  Psychiatric:        Mood and Affect: Mood normal.        Behavior: Behavior normal.        Thought Content: Thought content normal.        Judgment: Judgment  normal.     Most recent functional status assessment: In your present state of health, do you have any difficulty performing the following activities: 04/29/2021  Hearing? N  Vision? N  Difficulty concentrating or making decisions? N  Walking or climbing stairs? N  Dressing or bathing? N  Doing errands, shopping? N  Some recent data might be hidden   Most recent fall risk assessment: Fall Risk  04/29/2021  Falls in the past year? 0  Number falls in past yr: 0  Injury with Fall? 0  Risk for fall due to : No Fall Risks  Follow up Falls evaluation completed    Most recent depression screenings: PHQ 2/9 Scores 04/29/2021 04/23/2020  PHQ - 2 Score 0 0  PHQ- 9 Score - -   Most recent cognitive screening: 6CIT Screen 04/23/2020  What Year? 0 points  What month? 0 points  What time? 0 points  Count back from 20 0 points  Months in reverse 0 points  Repeat phrase 0 points  Total Score 0   Most recent Audit-C alcohol use screening Alcohol Use Disorder Test (AUDIT) 04/29/2021  1. How often do you have a drink containing alcohol? 0  2. How many drinks containing alcohol do you have on a typical day when you are drinking? 0  3. How often do you have six or more drinks on one occasion? 0  AUDIT-C Score 0  Alcohol Brief Interventions/Follow-up -   A score of 3 or more in women, and 4 or more in men indicates increased risk for alcohol abuse, EXCEPT if all of the points are from question 1   No results found for any visits on 04/29/21.  Assessment & Plan     Annual wellness visit done today including the all of the following: Reviewed patient's Family Medical History Reviewed and updated list of patient's medical providers Assessment of cognitive impairment was done Assessed patient's functional ability Established a written schedule for health screening [place] Completed and Reviewed  Exercise Activities and Dietary recommendations  Goals      DIET -  INCREASE WATER INTAKE     Recommend increasing water intake to 6-8 glasses a day.         Immunization History  Administered Date(s) Administered   Fluad Quad(high Dose 65+) 04/20/2019   Influenza, High Dose Seasonal PF 08/14/2016, 06/16/2017, 06/09/2018, 04/13/2020   Influenza,inj,Quad PF,6+ Mos 08/09/2013, 08/14/2015   PFIZER(Purple Top)SARS-COV-2 Vaccination 09/16/2019, 10/07/2019   Pneumococcal Conjugate-13 08/14/2015  Pneumococcal Polysaccharide-23 06/14/2017, 09/07/2017   Td 01/03/2005   Tdap 07/24/2011   Zoster Recombinat (Shingrix) 07/07/2019, 12/13/2019   Zoster, Live 08/09/2013    Health Maintenance  Topic Date Due   INFLUENZA VACCINE  03/04/2021   COVID-19 Vaccine (5 - Booster for Pfizer series) 04/06/2021   TETANUS/TDAP  07/23/2021   COLONOSCOPY (Pts 45-28yrs Insurance coverage will need to be confirmed)  11/12/2021   Hepatitis C Screening  Completed   Zoster Vaccines- Shingrix  Completed   HPV VACCINES  Aged Out     Discussed health benefits of physical activity, and encouraged him to engage in regular exercise appropriate for his age and condition.    1. Encounter for Medicare annual wellness exam   2. Annual physical exam   3. Hyperlipidemia, unspecified hyperlipidemia type  - Lipid panel - TSH - CBC w/Diff/Platelet - Comprehensive Metabolic Panel (CMET)  4. Cervical radiculopathy   5. Cancer of appendix ([place])  - CBC w/Diff/Platelet  6. History of prostate cancer  - PSA  7. Colon cancer screening Colonoscopy every 5 years. - Ambulatory referral to Gastroenterology  8. Need for influenza vaccination  - Flu Vaccine QUAD High Dose(Fluad)   No follow-ups on file.        Xabi Wittler Cranford Mon, MD  [facility] [phone] (phone) [phone] (fax)  Home

## 2021-04-30 LAB — COMPREHENSIVE METABOLIC PANEL
ALT: 29 IU/L (ref 0–44)
AST: 34 IU/L (ref 0–40)
Albumin/Globulin Ratio: 2.4 — ABNORMAL HIGH (ref 1.2–2.2)
Albumin: 4.6 g/dL (ref 3.8–4.8)
Alkaline Phosphatase: 43 IU/L — ABNORMAL LOW (ref 44–121)
BUN/Creatinine Ratio: 12 (ref 10–24)
BUN: 11 mg/dL (ref 8–27)
Bilirubin Total: 0.9 mg/dL (ref 0.0–1.2)
CO2: 24 mmol/L (ref 20–29)
Calcium: 9.4 mg/dL (ref 8.6–10.2)
Chloride: 101 mmol/L (ref 96–106)
Creatinine, Ser: 0.94 mg/dL (ref 0.76–1.27)
Globulin, Total: 1.9 g/dL (ref 1.5–4.5)
Glucose: 93 mg/dL (ref 70–99)
Potassium: 4.3 mmol/L (ref 3.5–5.2)
Sodium: 139 mmol/L (ref 134–144)
Total Protein: 6.5 g/dL (ref 6.0–8.5)
eGFR: 87 mL/min/{1.73_m2} (ref >59)

## 2021-04-30 LAB — LIPID PANEL
Chol/HDL Ratio: 2.6 ratio (ref 0.0–5.0)
Cholesterol, Total: 195 mg/dL (ref 100–199)
HDL: 76 mg/dL (ref >39)
LDL Chol Calc (NIH): 105 mg/dL — ABNORMAL HIGH (ref 0–99)
Triglycerides: 75 mg/dL (ref 0–149)
VLDL Cholesterol Cal: 14 mg/dL (ref 5–40)

## 2021-04-30 LAB — CBC WITH DIFFERENTIAL/PLATELET
Basophils Absolute: 0 10*3/uL (ref 0.0–0.2)
Basos: 0 %
EOS (ABSOLUTE): 0.3 10*3/uL (ref 0.0–0.4)
Eos: 4 %
Hematocrit: 41.2 % (ref 37.5–51.0)
Hemoglobin: 14.8 g/dL (ref 13.0–17.7)
Immature Grans (Abs): 0 10*3/uL (ref 0.0–0.1)
Immature Granulocytes: 0 %
Lymphocytes Absolute: 1.4 10*3/uL (ref 0.7–3.1)
Lymphs: 23 %
MCH: 32.6 pg (ref 26.6–33.0)
MCHC: 35.9 g/dL — ABNORMAL HIGH (ref 31.5–35.7)
MCV: 91 fL (ref 79–97)
Monocytes Absolute: 0.7 10*3/uL (ref 0.1–0.9)
Monocytes: 11 %
Neutrophils Absolute: 3.7 10*3/uL (ref 1.4–7.0)
Neutrophils: 62 %
Platelets: 219 10*3/uL (ref 150–450)
RBC: 4.54 x10E6/uL (ref 4.14–5.80)
RDW: 12.4 % (ref 11.6–15.4)
WBC: 6 10*3/uL (ref 3.4–10.8)

## 2021-04-30 LAB — PSA: Prostate Specific Ag, Serum: 0.2 ng/mL (ref 0.0–4.0)

## 2021-04-30 LAB — TSH: TSH: 1.64 u[IU]/mL (ref 0.450–4.500)

## 2021-06-07 ENCOUNTER — Other Ambulatory Visit: Payer: Self-pay | Admitting: Family Medicine

## 2021-06-07 NOTE — Telephone Encounter (Signed)
Requested Prescriptions  Pending Prescriptions Disp Refills  . celecoxib (CELEBREX) 200 MG capsule [Pharmacy Med Name: CELECOXIB 200 MG CAPSULE] 90 capsule 0    Sig: TAKE 1 CAPSULE BY MOUTH EVERY DAY AS NEEDED     Analgesics:  COX2 Inhibitors Passed - 06/07/2021 10:28 AM      Passed - HGB in normal range and within 360 days    Hemoglobin  Date Value Ref Range Status  04/29/2021 14.8 13.0 - 17.7 g/dL Final         Passed - Cr in normal range and within 360 days    Creatinine  Date Value Ref Range Status  11/13/2014 1.02 mg/dL Final    Comment:    0.61-1.24 NOTE: New Reference Range  10/10/14    Creatinine, Ser  Date Value Ref Range Status  04/29/2021 0.94 0.76 - 1.27 mg/dL Final         Passed - Patient is not pregnant      Passed - Valid encounter within last 12 months    Recent Outpatient Visits          1 month ago Encounter for [employer] annual wellness exam   [facility] [name]., MD   6 months ago Cervical radiculopathy   [facility] [name]., MD   1 year ago Annual physical exam   [facility] [name]., MD   1 year ago Right lower quadrant abdominal pain   [facility] [name]., MD   2 years ago Annual physical exam   [facility] [name]., MD      Future Appointments            In 10 months [place], Ronda Fairly, [facility]

## 2021-08-05 DIAGNOSIS — M50922 Unspecified cervical disc disorder at C5-C6 level: Secondary | ICD-10-CM | POA: Diagnosis not present

## 2021-08-05 DIAGNOSIS — M9901 Segmental and somatic dysfunction of cervical region: Secondary | ICD-10-CM | POA: Diagnosis not present

## 2021-08-05 DIAGNOSIS — M5412 Radiculopathy, cervical region: Secondary | ICD-10-CM | POA: Diagnosis not present

## 2021-08-05 DIAGNOSIS — M50921 Unspecified cervical disc disorder at C4-C5 level: Secondary | ICD-10-CM | POA: Diagnosis not present

## 2021-08-05 DIAGNOSIS — M7918 Myalgia, other site: Secondary | ICD-10-CM | POA: Diagnosis not present

## 2021-08-11 IMAGING — MR MR KNEE*R* W/O CM
7 series · 40 of 40 positions shown · non-contrast
Comparison: None.

CLINICAL DATA: Diffuse right knee pain since January 2020. No known
injury.

EXAM:
MRI OF THE RIGHT KNEE WITHOUT CONTRAST
TECHNIQUE: Multiplanar, multisequence MR imaging of the knee was performed. No
intravenous contrast was administered.

[Series 8: T2 fat-sat · axial · right · 4.0mm · 0.50mm/px · z∈[-90,+53]mm · 5 of 30 slices shown (1 of 3)]
[im 1/30]
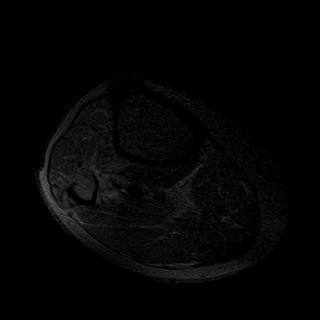
[im 8/30]
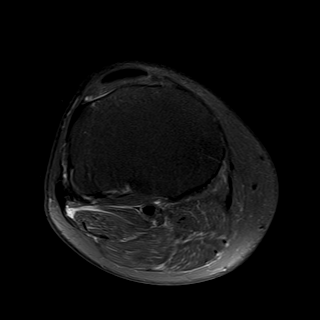
[im 15/30]
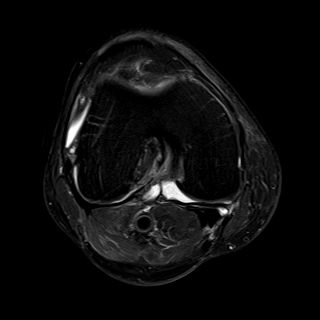
[im 22/30]
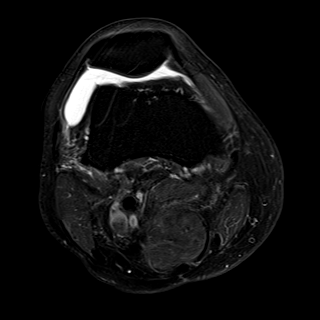
[im 30/30]
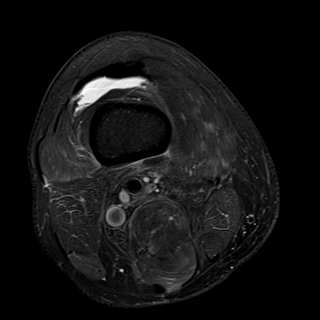

[Series 9: T2 fat-sat · coronal · right · 4.0mm · 0.59mm/px · 6 of 30 slices shown (2 of 3)]
[im 1/30]
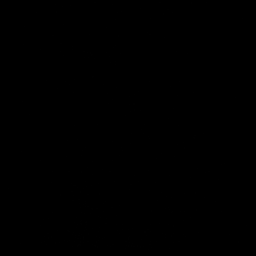
[im 6/30]
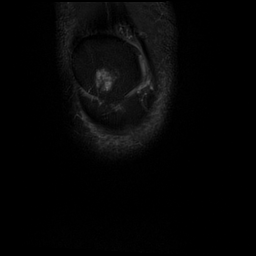
[im 12/30]
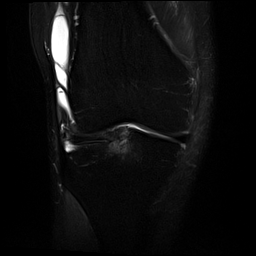
[im 18/30]
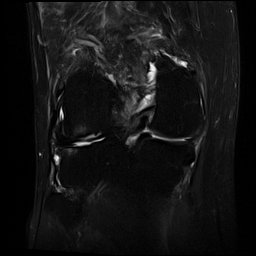
[im 24/30]
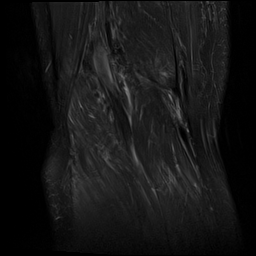
[im 30/30]
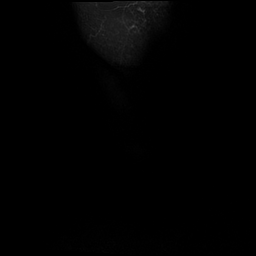

[Series 10: T1 · coronal · right · 4.0mm · 0.59mm/px · 6 of 30 slices shown]
[im 1/30]
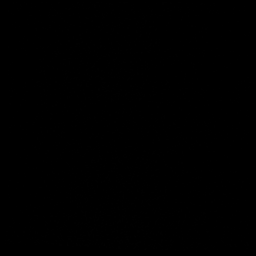
[im 6/30]
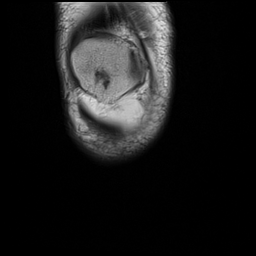
[im 12/30]
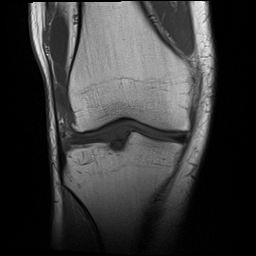
[im 18/30]
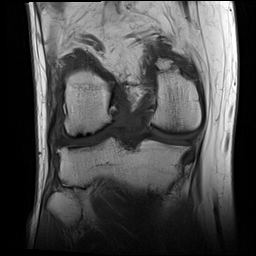
[im 24/30]
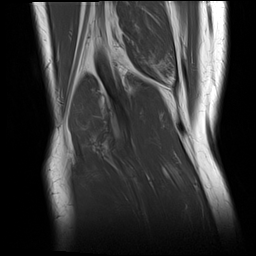
[im 30/30]
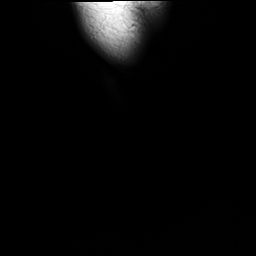

[Series 11: PD fat-sat · coronal · right · 4.0mm · 0.59mm/px · 6 of 29 slices shown (1 of 2)]
[im 1/29]
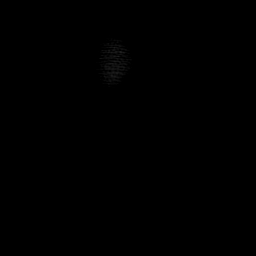
[im 6/29]
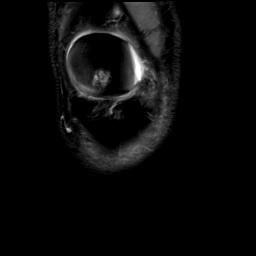
[im 12/29]
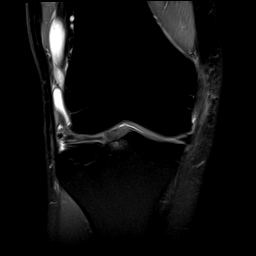
[im 17/29]
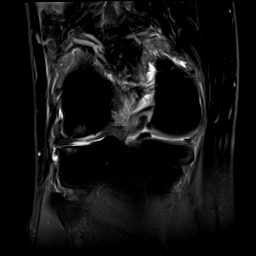
[im 23/29]
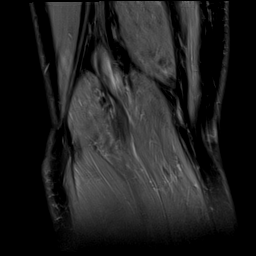
[im 29/29]
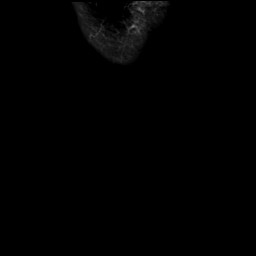

[Series 12: PD fat-sat · sagittal · right · 3.0mm · 0.59mm/px · 7 of 35 slices shown (2 of 2)]
[im 1/35]
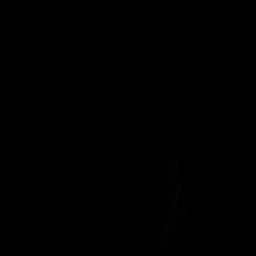
[im 6/35]
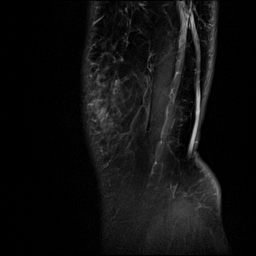
[im 12/35]
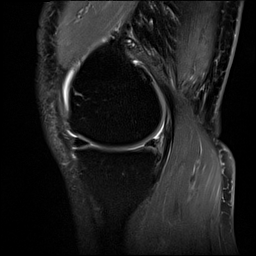
[im 18/35]
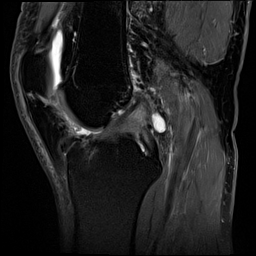
[im 23/35]
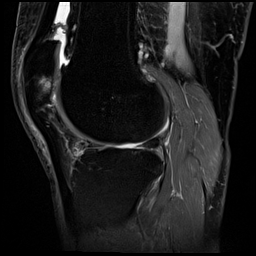
[im 29/35]
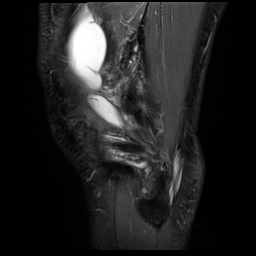
[im 35/35]
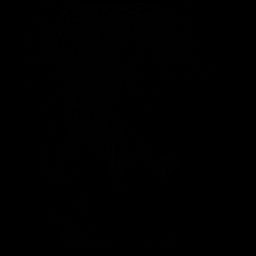

[Series 13: T2 fat-sat · sagittal · right · 3.0mm · 0.59mm/px · 7 of 36 slices shown (3 of 3)]
[im 1/36]
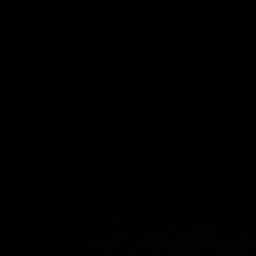
[im 6/36]
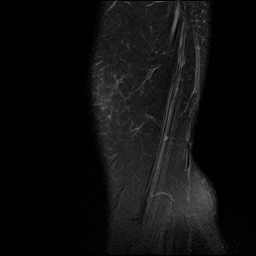
[im 12/36]
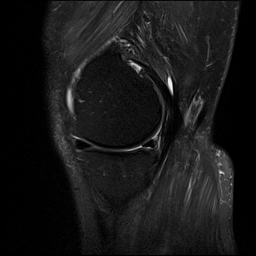
[im 18/36]
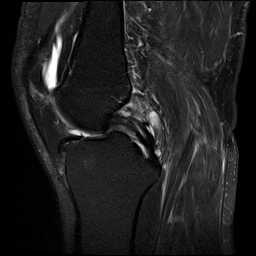
[im 24/36]
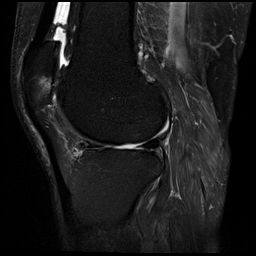
[im 30/36]
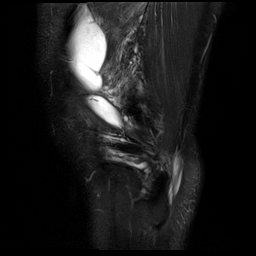
[im 36/36]
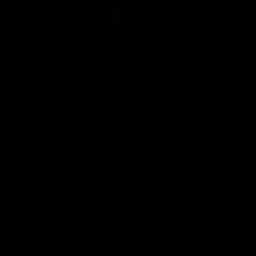

[Series 14: PD · coronal · right · 2.0mm · 0.47mm/px · 3 of 16 slices shown]
[im 1/16]
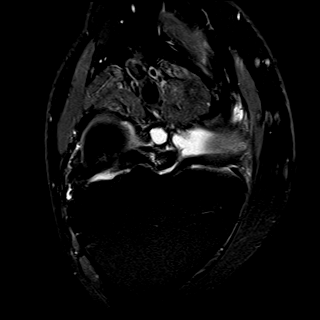
[im 8/16]
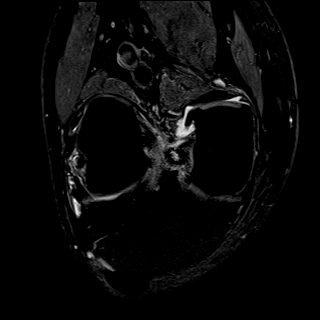
[im 16/16]
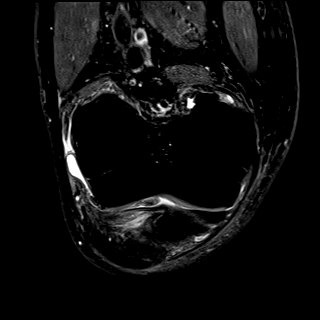

[40 of 40 positions shown; findings below may reference images not displayed]

FINDINGS: MENISCI

Medial meniscus: The body and posterior horn are severely
degenerated and markedly diminutive. There is blunting along the
free edge of the posterior horn and body throughout and a large tear
at the junction of the posterior horn and body.

Lateral meniscus: The majority of the anterior horn is not
visualized consistent with degenerative maceration. Extensive
fraying is seen along the free edge of the body and posterior horn.

LIGAMENTS

Cruciates: Intact. There is marked mucoid degeneration of both the
ACL and PCL.

Collaterals:  Intact.

CARTILAGE

Patellofemoral: Cartilage loss is worst along the lateral patellar
facet where there is a prominent focus of underlying subchondral
edema and cyst formation.

Medial:  Degenerated and thinned throughout.

Lateral:  Severely degenerated and thinned throughout.

Joint:  Small joint effusion.

Popliteal Fossa:  Very small Baker's cyst.

Extensor Mechanism:  Intact.

Bones: No fracture or worrisome lesion. Osteophytes are seen about
all 3 compartments of the knee.

Other: None.
IMPRESSION: Dominant finding is tricompartmental osteoarthritis which appears
worst laterally.

Markedly diminutive and severely degenerated posterior horn and body
of the medial meniscus with a large tear at the junction of the
posterior horn and body.

Degenerative maceration of the anterior horn of the lateral
meniscus. The body of the lateral meniscus is severely degenerated
with marked fraying along its free edge.

## 2021-08-13 DIAGNOSIS — M50922 Unspecified cervical disc disorder at C5-C6 level: Secondary | ICD-10-CM | POA: Diagnosis not present

## 2021-08-13 DIAGNOSIS — M5412 Radiculopathy, cervical region: Secondary | ICD-10-CM | POA: Diagnosis not present

## 2021-08-13 DIAGNOSIS — M50921 Unspecified cervical disc disorder at C4-C5 level: Secondary | ICD-10-CM | POA: Diagnosis not present

## 2021-08-13 DIAGNOSIS — M7918 Myalgia, other site: Secondary | ICD-10-CM | POA: Diagnosis not present

## 2021-08-13 DIAGNOSIS — M9901 Segmental and somatic dysfunction of cervical region: Secondary | ICD-10-CM | POA: Diagnosis not present

## 2021-08-16 DIAGNOSIS — M50921 Unspecified cervical disc disorder at C4-C5 level: Secondary | ICD-10-CM | POA: Diagnosis not present

## 2021-08-16 DIAGNOSIS — M9901 Segmental and somatic dysfunction of cervical region: Secondary | ICD-10-CM | POA: Diagnosis not present

## 2021-08-16 DIAGNOSIS — M50922 Unspecified cervical disc disorder at C5-C6 level: Secondary | ICD-10-CM | POA: Diagnosis not present

## 2021-08-16 DIAGNOSIS — M7918 Myalgia, other site: Secondary | ICD-10-CM | POA: Diagnosis not present

## 2021-08-16 DIAGNOSIS — M5412 Radiculopathy, cervical region: Secondary | ICD-10-CM | POA: Diagnosis not present

## 2021-08-21 DIAGNOSIS — M50921 Unspecified cervical disc disorder at C4-C5 level: Secondary | ICD-10-CM | POA: Diagnosis not present

## 2021-08-21 DIAGNOSIS — M7918 Myalgia, other site: Secondary | ICD-10-CM | POA: Diagnosis not present

## 2021-08-21 DIAGNOSIS — M9901 Segmental and somatic dysfunction of cervical region: Secondary | ICD-10-CM | POA: Diagnosis not present

## 2021-08-21 DIAGNOSIS — M5412 Radiculopathy, cervical region: Secondary | ICD-10-CM | POA: Diagnosis not present

## 2021-08-21 DIAGNOSIS — M50922 Unspecified cervical disc disorder at C5-C6 level: Secondary | ICD-10-CM | POA: Diagnosis not present

## 2021-08-26 DIAGNOSIS — M50921 Unspecified cervical disc disorder at C4-C5 level: Secondary | ICD-10-CM | POA: Diagnosis not present

## 2021-08-26 DIAGNOSIS — M5412 Radiculopathy, cervical region: Secondary | ICD-10-CM | POA: Diagnosis not present

## 2021-08-26 DIAGNOSIS — M50922 Unspecified cervical disc disorder at C5-C6 level: Secondary | ICD-10-CM | POA: Diagnosis not present

## 2021-08-26 DIAGNOSIS — M9901 Segmental and somatic dysfunction of cervical region: Secondary | ICD-10-CM | POA: Diagnosis not present

## 2021-08-26 DIAGNOSIS — M7918 Myalgia, other site: Secondary | ICD-10-CM | POA: Diagnosis not present

## 2021-09-04 ENCOUNTER — Other Ambulatory Visit: Payer: Self-pay | Admitting: Family Medicine

## 2021-09-04 DIAGNOSIS — E7800 Pure hypercholesterolemia, unspecified: Secondary | ICD-10-CM

## 2021-09-04 DIAGNOSIS — L570 Actinic keratosis: Secondary | ICD-10-CM | POA: Diagnosis not present

## 2021-09-04 DIAGNOSIS — L905 Scar conditions and fibrosis of skin: Secondary | ICD-10-CM | POA: Diagnosis not present

## 2021-09-04 DIAGNOSIS — D2261 Melanocytic nevi of right upper limb, including shoulder: Secondary | ICD-10-CM | POA: Diagnosis not present

## 2021-09-04 DIAGNOSIS — Z85828 Personal history of other malignant neoplasm of skin: Secondary | ICD-10-CM | POA: Diagnosis not present

## 2021-09-04 DIAGNOSIS — D225 Melanocytic nevi of trunk: Secondary | ICD-10-CM | POA: Diagnosis not present

## 2021-09-04 DIAGNOSIS — D2262 Melanocytic nevi of left upper limb, including shoulder: Secondary | ICD-10-CM | POA: Diagnosis not present

## 2021-09-04 NOTE — Telephone Encounter (Signed)
Requested Prescriptions  Pending Prescriptions Disp Refills   simvastatin (ZOCOR) 10 MG tablet [Pharmacy Med Name: SIMVASTATIN 10 MG TABLET] 90 tablet 2    Sig: TAKE 1 TABLET BY MOUTH EVERYDAY AT BEDTIME     Cardiovascular:  Antilipid - Statins Failed - 09/04/2021  2:55 AM      Failed - Lipid Panel in normal range within the last 12 months    Cholesterol, Total  Date Value Ref Range Status  04/29/2021 195 100 - 199 mg/dL Final   LDL Chol Calc (NIH)  Date Value Ref Range Status  04/29/2021 105 (H) 0 - 99 mg/dL Final   HDL  Date Value Ref Range Status  04/29/2021 76 >39 mg/dL Final   Triglycerides  Date Value Ref Range Status  04/29/2021 75 0 - 149 mg/dL Final         Passed - Patient is not pregnant      Passed - Valid encounter within last 12 months    Recent Outpatient Visits          4 months ago Encounter for [employer] annual wellness exam   [facility] [name]., MD   9 months ago Cervical radiculopathy   [facility] [name]., MD   1 year ago Annual physical exam   [facility] [name]., MD   1 year ago Right lower quadrant abdominal pain   [facility] [name]., MD   2 years ago Annual physical exam   [facility] [name]., MD      Future Appointments            In 7 months [place], Ronda Fairly, MD [facility]

## 2021-09-05 ENCOUNTER — Other Ambulatory Visit: Payer: Self-pay | Admitting: Family Medicine

## 2021-10-01 DIAGNOSIS — M5412 Radiculopathy, cervical region: Secondary | ICD-10-CM | POA: Diagnosis not present

## 2021-10-01 DIAGNOSIS — M542 Cervicalgia: Secondary | ICD-10-CM | POA: Diagnosis not present

## 2021-10-04 ENCOUNTER — Other Ambulatory Visit: Payer: Self-pay | Admitting: Neurosurgery

## 2021-10-04 DIAGNOSIS — M5412 Radiculopathy, cervical region: Secondary | ICD-10-CM

## 2021-10-04 DIAGNOSIS — M542 Cervicalgia: Secondary | ICD-10-CM

## 2021-10-13 ENCOUNTER — Other Ambulatory Visit: Payer: Self-pay

## 2021-10-13 ENCOUNTER — Ambulatory Visit
Admission: RE | Admit: 2021-10-13 | Discharge: 2021-10-13 | Disposition: A | Discharge status: Home / Self Care | Payer: No Typology Code available for payment source | Source: Ambulatory Visit | Attending: Neurosurgery | Admitting: Neurosurgery

## 2021-10-13 DIAGNOSIS — M47812 Spondylosis without myelopathy or radiculopathy, cervical region: Secondary | ICD-10-CM | POA: Diagnosis not present

## 2021-10-13 DIAGNOSIS — M542 Cervicalgia: Secondary | ICD-10-CM | POA: Diagnosis not present

## 2021-10-13 DIAGNOSIS — M4802 Spinal stenosis, cervical region: Secondary | ICD-10-CM | POA: Diagnosis not present

## 2021-10-13 DIAGNOSIS — M5412 Radiculopathy, cervical region: Secondary | ICD-10-CM | POA: Diagnosis not present

## 2021-10-13 DIAGNOSIS — M50223 Other cervical disc displacement at C6-C7 level: Secondary | ICD-10-CM | POA: Diagnosis not present

## 2021-10-18 DIAGNOSIS — M542 Cervicalgia: Secondary | ICD-10-CM | POA: Diagnosis not present

## 2021-10-18 DIAGNOSIS — M436 Torticollis: Secondary | ICD-10-CM | POA: Diagnosis not present

## 2021-10-18 DIAGNOSIS — M6281 Muscle weakness (generalized): Secondary | ICD-10-CM | POA: Diagnosis not present

## 2021-10-24 DIAGNOSIS — M542 Cervicalgia: Secondary | ICD-10-CM | POA: Diagnosis not present

## 2021-11-01 DIAGNOSIS — M542 Cervicalgia: Secondary | ICD-10-CM | POA: Diagnosis not present

## 2021-11-06 DIAGNOSIS — M542 Cervicalgia: Secondary | ICD-10-CM | POA: Diagnosis not present

## 2021-12-01 ENCOUNTER — Other Ambulatory Visit: Payer: Self-pay | Admitting: Family Medicine

## 2022-01-28 ENCOUNTER — Telehealth: Payer: Self-pay | Admitting: Urology

## 2022-01-28 NOTE — Telephone Encounter (Signed)
Pt would like to have his meds changed back to [facility] in [place].

## 2022-01-29 MED ORDER — AMBULATORY NON FORMULARY MEDICATION: 6 refills | Status: DC

## 2022-01-29 NOTE — Telephone Encounter (Signed)
The trimix dose was change and faxed to [employer] [facility].

## 2022-01-30 ENCOUNTER — Other Ambulatory Visit: Payer: Self-pay | Admitting: Family Medicine

## 2022-02-28 DIAGNOSIS — H52223 Regular astigmatism, bilateral: Secondary | ICD-10-CM | POA: Diagnosis not present

## 2022-03-04 ENCOUNTER — Other Ambulatory Visit: Payer: Self-pay | Admitting: Family Medicine

## 2022-03-05 ENCOUNTER — Telehealth: Payer: Self-pay | Admitting: Gastroenterology

## 2022-03-05 ENCOUNTER — Telehealth: Payer: Self-pay | Admitting: Family Medicine

## 2022-03-05 NOTE — Telephone Encounter (Signed)
Pt called to confirm next OV, gave pt the date. Pt also inquired about a colonoscopy. He states no on called him back in April 2023 when he wanted to have the procedure done. Gave pt number to  gastroenterology to schedule colonoscopy. Order was placed in Sept. 2022, may need another referral order placed. Please advise.

## 2022-03-05 NOTE — Telephone Encounter (Signed)
Patient left vm wanting to schedule his colonoscopy. Requests call back.

## 2022-03-06 ENCOUNTER — Telehealth: Payer: Self-pay

## 2022-03-06 ENCOUNTER — Other Ambulatory Visit: Payer: Self-pay

## 2022-03-06 DIAGNOSIS — Z86010 Personal history of colonic polyps: Secondary | ICD-10-CM

## 2022-03-06 MED ORDER — NA SULFATE-K SULFATE-MG SULF 17.5-3.13-1.6 GM/177ML PO SOLN: 1.0000 | Freq: Once | ORAL | 0 refills | Status: AC

## 2022-03-06 NOTE — Telephone Encounter (Signed)
Gastroenterology Pre-Procedure Review  Request Date: 04/22/22 Requesting Physician: Dr. Allen Norris  PATIENT REVIEW QUESTIONS: The patient responded to the following health history questions as indicated:    1. Are you having any GI issues? no 2. Do you have a personal history of Polyps? yes (11/12/2016 Dr. Jamal Collin noted 51m polyp) 3. Do you have a family history of Colon Cancer or Polyps? no 4. Diabetes Mellitus? no 5. Joint replacements in the past 12 months?no 6. Major health problems in the past 3 months?no 7. Any artificial heart valves, MVP, or defibrillator?no    MEDICATIONS & ALLERGIES:    Patient reports the following regarding taking any anticoagulation/antiplatelet therapy:   Plavix, Coumadin, Eliquis, Xarelto, Lovenox, Pradaxa, Brilinta, or Effient? no Aspirin? yes (81 mg daily)  Patient confirms/reports the following medications:  Current Outpatient Medications  Medication Sig Dispense Refill   AMBULATORY NON FORMULARY MEDICATION Trimix 75/2.5/25 -(Pap/Phent/PGE)  Dosage: Inject 0.5-1 cc per injection   Vial 167m Qty #10 [place] Phone: [phone] 10 mL 6   Ascorbic Acid (VITAMIN C) 1000 MG tablet Take 1,000 mg by mouth daily.     aspirin EC 81 MG tablet Take 81 mg by mouth daily.     celecoxib (CELEBREX) 200 MG capsule TAKE 1 CAPSULE BY MOUTH EVERY DAY AS NEEDED 90 capsule 3   Coenzyme Q10 100 MG capsule Take 100 mg by mouth daily.     Multiple Vitamins-Minerals (MULTIVITAMIN ADULT PO) Take by mouth daily.     mupirocin ointment (BACTROBAN) 2 % Apply 1 application topically as directed. In nasal twice weekly.     NON FORMULARY Beet juice daily     Omega-3 Fatty Acids (FISH OIL) 1200 MG CAPS Take by mouth daily.      sildenafil (REVATIO) 20 MG tablet Take 1 tablet (20 mg total) by mouth daily. 90 tablet 3   simvastatin (ZOCOR) 10 MG tablet TAKE 1 TABLET BY MOUTH EVERYDAY AT BEDTIME 90 tablet 2   vitamin E  180 MG (400 UNITS) capsule Take 400 Units by mouth daily.     No current facility-administered medications for this visit.    Patient confirms/reports the following allergies:  No Known Allergies  No orders of the defined types were placed in this encounter.   AUTHORIZATION INFORMATION Primary Insurance: 1D#: Group #:  Secondary Insurance: 1D#: Group #:  SCHEDULE INFORMATION: Date: 04/22/22 Time: Location: [facility]

## 2022-03-06 NOTE — Telephone Encounter (Signed)
Patient just missed your call and is requesting you call back.

## 2022-03-06 NOTE — Telephone Encounter (Signed)
Returned patients call to schedule colonoscopy.  Referral I [facility]que 04/29/21.    Scheduling note: last colonoscopy performed by Dr. Jamal Collin 11/12/16 polyps were noted.  Thanks, [place], [place]

## 2022-03-06 NOTE — Telephone Encounter (Signed)
Looks like patient contacted GI and they have him scheduled for Colonoscopy [phone] Personal history of colon polyps Z86.010 [facility] Dr. Allen Norris 04/22/22

## 2022-03-31 DIAGNOSIS — H2513 Age-related nuclear cataract, bilateral: Secondary | ICD-10-CM | POA: Diagnosis not present

## 2022-03-31 DIAGNOSIS — H43813 Vitreous degeneration, bilateral: Secondary | ICD-10-CM | POA: Diagnosis not present

## 2022-03-31 DIAGNOSIS — H40003 Preglaucoma, unspecified, bilateral: Secondary | ICD-10-CM | POA: Diagnosis not present

## 2022-04-18 ENCOUNTER — Ambulatory Visit: Payer: No Typology Code available for payment source | Admitting: Urology

## 2022-04-22 ENCOUNTER — Encounter: Payer: Self-pay | Admitting: Gastroenterology

## 2022-04-22 ENCOUNTER — Encounter
Admission: RE | Disposition: A | Discharge status: Home / Self Care | Payer: Self-pay | Source: Home / Self Care | Attending: Gastroenterology

## 2022-04-22 ENCOUNTER — Ambulatory Visit: Payer: No Typology Code available for payment source | Admitting: Registered Nurse

## 2022-04-22 ENCOUNTER — Ambulatory Visit
Admission: RE | Admit: 2022-04-22 | Discharge: 2022-04-22 | Disposition: A | Discharge status: Home / Self Care | Payer: No Typology Code available for payment source | Attending: Gastroenterology | Admitting: Gastroenterology

## 2022-04-22 DIAGNOSIS — M1990 Unspecified osteoarthritis, unspecified site: Secondary | ICD-10-CM | POA: Insufficient documentation

## 2022-04-22 DIAGNOSIS — Z1211 Encounter for screening for malignant neoplasm of colon: Secondary | ICD-10-CM | POA: Insufficient documentation

## 2022-04-22 DIAGNOSIS — D125 Benign neoplasm of sigmoid colon: Secondary | ICD-10-CM | POA: Diagnosis not present

## 2022-04-22 DIAGNOSIS — K5730 Diverticulosis of large intestine without perforation or abscess without bleeding: Secondary | ICD-10-CM | POA: Insufficient documentation

## 2022-04-22 DIAGNOSIS — Z860100 Personal history of colon polyps, unspecified: Secondary | ICD-10-CM

## 2022-04-22 DIAGNOSIS — K635 Polyp of colon: Secondary | ICD-10-CM | POA: Insufficient documentation

## 2022-04-22 DIAGNOSIS — K640 First degree hemorrhoids: Secondary | ICD-10-CM | POA: Insufficient documentation

## 2022-04-22 HISTORY — PX: COLONOSCOPY WITH PROPOFOL: SHX5780

## 2022-04-22 SURGERY — COLONOSCOPY WITH PROPOFOL
Anesthesia: General

## 2022-04-22 MED ORDER — SODIUM CHLORIDE 0.9 % IV SOLN: INTRAVENOUS | Status: DC

## 2022-04-22 MED ORDER — LIDOCAINE HCL (CARDIAC) PF 100 MG/5ML IV SOSY
PREFILLED_SYRINGE | INTRAVENOUS | Status: DC | PRN
  Administered 2022-04-22: 100 mg via INTRAVENOUS

## 2022-04-22 MED ORDER — PROPOFOL 500 MG/50ML IV EMUL
INTRAVENOUS | Status: DC | PRN
  Administered 2022-04-22: 175 ug/kg/min via INTRAVENOUS

## 2022-04-22 MED ORDER — PROPOFOL 10 MG/ML IV BOLUS
INTRAVENOUS | Status: DC | PRN
  Administered 2022-04-22: 80 mg via INTRAVENOUS

## 2022-04-22 NOTE — Op Note (Signed)
[facility] Gastroenterology Patient Name: Edward Mccoy Procedure Date: 04/22/2022 8:48 AM MRN: [id number] Account #: [id number] Date of Birth: 1950/12/07 Admit Type: Outpatient Age: 71 Room: [facility] ENDO ROOM 4 Gender: Male Note Status: Finalized Instrument Name: Jasper Riling [id number] Procedure:             Colonoscopy Indications:           High risk colon cancer surveillance: Personal history                         of colonic polyps Providers:             Lucilla Lame MD, MD Referring MD:          Janine Ores. Rosanna Randy, MD (Referring MD) Medicines:             Propofol per Anesthesia Complications:         No immediate complications. Procedure:             Pre-Anesthesia Assessment:                        - Prior to the procedure, a History and Physical was                         performed, and patient medications and allergies were                         reviewed. The patient's tolerance of previous                         anesthesia was also reviewed. The risks and benefits                         of the procedure and the sedation options and risks                         were discussed with the patient. All questions were                         answered, and informed consent was obtained. Prior                         Anticoagulants: The patient has taken no previous                         anticoagulant or antiplatelet agents. ASA Grade                         Assessment: II - A patient with mild systemic disease.                         After reviewing the risks and benefits, the patient                         was deemed in satisfactory condition to undergo the                         procedure.  After obtaining informed consent, the colonoscope was                         passed under direct vision. Throughout the procedure,                         the patient's blood pressure, pulse, and oxygen                         saturations were  monitored continuously. The                         Colonoscope was introduced through the anus and                         advanced to the the cecum, identified by appendiceal                         orifice and ileocecal valve. The colonoscopy was                         performed without difficulty. The patient tolerated                         the procedure well. The quality of the bowel                         preparation was excellent. Findings:      The perianal and digital rectal examinations were normal.      Multiple small-mouthed diverticula were found in the sigmoid colon and       descending colon.      A 3 mm polyp was found in the sigmoid colon. The polyp was sessile. The       polyp was removed with a cold biopsy forceps. Resection and retrieval       were complete.      Non-bleeding internal hemorrhoids were found during retroflexion. The       hemorrhoids were Grade I (internal hemorrhoids that do not prolapse). Impression:            - Diverticulosis in the sigmoid colon and in the                         descending colon.                        - One 3 mm polyp in the sigmoid colon, removed with a                         cold biopsy forceps. Resected and retrieved.                        - Non-bleeding internal hemorrhoids. Recommendation:        - Discharge patient to home.                        - Resume previous diet.                        - Continue present medications.                        -  Await pathology results.                        - Repeat colonoscopy is not recommended for                         surveillance. Procedure Code(s):     --- Professional ---                        [id number], Colonoscopy, flexible; with biopsy, single or                         multiple Diagnosis Code(s):     --- Professional ---                        Z86.010, Personal history of colonic polyps                        K63.5, Polyp of colon CPT copyright 2019 American  Medical Association. All rights reserved. The codes documented in this report are preliminary and upon coder review may  be revised to meet current compliance requirements. Lucilla Lame MD, MD 04/22/2022 9:25:51 AM This report has been signed electronically. Number of Addenda: 0 Note Initiated On: 04/22/2022 8:48 AM Scope Withdrawal Time: 0 hours 7 minutes 47 seconds  Total Procedure Duration: 0 hours 13 minutes 17 seconds  Estimated Blood Loss:  Estimated blood loss: none.      [facility]

## 2022-04-22 NOTE — H&P (Signed)
Edward Lame, MD [address] [place], [place] [address] [phone] Fax : [phone]  Primary Care Physician:  [name]., MD Primary Gastroenterologist:  Dr. Allen Norris  Pre-Procedure History & Physical: HPI:  ESDRAS Mccoy is a 71 y.o. male is here for an colonoscopy.   Past Medical History:  Diagnosis Date   Cancer 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 Authorizing Provider  Ascorbic Acid (VITAMIN C) 1000 MG tablet Take 1,000 mg by mouth daily.   Yes [provider]  aspirin EC 81 MG tablet Take 81 mg by mouth daily.   Yes [provider]  celecoxib (CELEBREX) 200 MG capsule TAKE 1 CAPSULE BY MOUTH EVERY DAY AS NEEDED 03/05/22  Yes [name]., MD  Coenzyme Q10 100 MG capsule Take 100 mg by mouth daily.   Yes [provider]  Multiple Vitamins-Minerals (MULTIVITAMIN ADULT PO) Take by mouth daily.   Yes [provider]  mupirocin ointment (BACTROBAN) 2 % Apply 1 application topically as directed. In nasal twice weekly.   Yes [provider]  NON FORMULARY Beet juice daily   Yes [provider]  Omega-3 Fatty Acids (FISH OIL) 1200 MG CAPS Take by mouth daily.    Yes [provider]  simvastatin (ZOCOR) 10 MG tablet TAKE 1 TABLET BY MOUTH  EVERYDAY AT BEDTIME 09/04/21  Yes [name]., MD  vitamin E 180 MG (400 UNITS) capsule Take 400 Units by mouth daily.   Yes [provider]  AMBULATORY NON FORMULARY MEDICATION Trimix 75/2.5/25 -(Pap/Phent/PGE)  Dosage: Inject 0.5-1 cc per injection   Vial 62m  Qty #10 [facility]  Phone: [phone]/28/23   Stoioff, SRonda Fairly MD  sildenafil (REVATIO) 20 MG tablet Take 1 tablet (20 mg total) by mouth daily. 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  Tobacco Use   Smoking status: Never   Smokeless tobacco: Never  Vaping Use   Vaping Use: Never used  Substance and Sexual Activity   Alcohol use: Yes    Alcohol/week: 0.0 standard drinks of alcohol  Comment: occasionally- beer once a month   Drug use: No   Sexual activity: Yes    Birth control/protection: None  Other Topics Concern   Not on file  Social History Narrative   Not on file   Social Determinants of Health   Financial Resource Strain: Low Risk  (04/23/2020)   Overall Financial Resource Strain (CARDIA)    Difficulty of Paying Living Expenses: Not hard at all  Food Insecurity: No Food Insecurity (04/23/2020)   Hunger Vital Sign    Worried About Running Out of Food in the Last Year: Never true    Ran Out of Food in the Last Year: Never true  Transportation Needs: No Transportation Needs (04/23/2020)   PRAPARE - Hydrologist (Medical): No    Lack of Transportation  (Non-Medical): No  Physical Activity: Sufficiently Active (04/23/2020)   Exercise Vital Sign    Days of Exercise per Week: 5 days    Minutes of Exercise per Session: 30 min  Stress: No Stress Concern Present (04/23/2020)   [place]    Feeling of Stress : Not at all  Social Connections: Moderately Isolated (04/23/2020)   Social Connection and Isolation Panel [NHANES]    Frequency of Communication with Friends and Family: More than three times a week    Frequency of Social Gatherings with Friends and Family: More than three times a week    Attends Religious Services: Never    Marine scientist or Organizations: No    Attends Archivist Meetings: Never    Marital Status: Married  Human resources officer Violence: Not At Risk (04/23/2020)   Humiliation, Afraid, Rape, and Kick questionnaire    Fear of Current or Ex-Partner: No    Emotionally Abused: No    Physically Abused: No    Sexually Abused: No    Review of Systems: See HPI, otherwise negative ROS  Physical Exam: There were no vitals taken for this visit. General:   Alert,  pleasant and cooperative in NAD Head:  Normocephalic and atraumatic. Neck:  Supple; no masses or thyromegaly. Lungs:  Clear throughout to auscultation.    Heart:  Regular rate and rhythm. Abdomen:  Soft, nontender and nondistended. Normal bowel sounds, without guarding, and without rebound.   Neurologic:  Alert and  oriented x4;  grossly normal neurologically.  Impression/Plan: [name] is here for an colonoscopy to be performed for a history of adenomatous polyps on  2018   Risks, benefits, limitations, and alternatives regarding  colonoscopy have been reviewed with the patient.  Questions have been answered.  All parties agreeable.   Edward Lame, MD  04/22/2022, 8:48 AM

## 2022-04-22 NOTE — Transfer of Care (Signed)
Immediate Anesthesia Transfer of Care Note  Patient: Edward Mccoy  Procedure(s) Performed: COLONOSCOPY WITH PROPOFOL  Patient Location: Endoscopy Unit  Anesthesia Type:General  Level of Consciousness: drowsy  Airway & Oxygen Therapy: Patient Spontanous Breathing  Post-op Assessment: Report given to RN and Post -op Vital signs reviewed and stable  Post vital signs: Reviewed and stable  Last Vitals:  Vitals Value Taken Time  BP 120/81 04/22/22 0930  Temp    Pulse 84 04/22/22 0930  Resp 16 04/22/22 0930  SpO2 98 % 04/22/22 0930  Vitals shown include unvalidated device data.  Last Pain:  Vitals:   04/22/22 0849  TempSrc: Temporal  PainSc: 0-No pain         Complications: No notable events documented.

## 2022-04-22 NOTE — Anesthesia Postprocedure Evaluation (Signed)
Anesthesia Post Note  Patient: Edward Mccoy  Procedure(s) Performed: COLONOSCOPY WITH PROPOFOL  Patient location during evaluation: Endoscopy Anesthesia Type: General Level of consciousness: awake and alert Pain management: pain level controlled Vital Signs Assessment: post-procedure vital signs reviewed and stable Respiratory status: spontaneous breathing, nonlabored ventilation, respiratory function stable and patient connected to nasal cannula oxygen Cardiovascular status: blood pressure returned to baseline and stable Postop Assessment: no apparent nausea or vomiting Anesthetic complications: no   No notable events documented.   Last Vitals:  Vitals:   04/22/22 0930 04/22/22 0940  BP: 120/81 113/70  Pulse: 84 62  Resp: 20 (!) 21  Temp: (!) 36.1 C   SpO2: 100% 98%    Last Pain:  Vitals:   04/22/22 0940  TempSrc:   PainSc: 0-No pain                 Ilene Qua

## 2022-04-22 NOTE — Anesthesia Preprocedure Evaluation (Signed)
Anesthesia Evaluation  Patient identified by MRN, date of birth, ID band Patient awake    Reviewed: Allergy & Precautions, NPO status , Patient's Chart, lab work & pertinent test results  History of Anesthesia Complications Negative for: history of anesthetic complications  Airway Mallampati: II  TM Distance: >3 FB Neck ROM: Full    Dental  (+) Poor Dentition   Pulmonary neg pulmonary ROS,    Pulmonary exam normal breath sounds clear to auscultation       Cardiovascular negative cardio ROS Normal cardiovascular exam Rhythm:Regular Rate:Normal     Neuro/Psych negative neurological ROS  negative psych ROS   GI/Hepatic negative GI ROS, Neg liver ROS,   Endo/Other  negative endocrine ROS  Renal/GU negative Renal ROS  negative genitourinary   Musculoskeletal  (+) Arthritis , Osteoarthritis,    Abdominal   Peds negative pediatric ROS (+)  Hematology negative hematology ROS (+)   Anesthesia Other Findings   Reproductive/Obstetrics negative OB ROS                             Anesthesia Physical Anesthesia Plan  ASA: 2  Anesthesia Plan: General   Post-op Pain Management: Minimal or no pain anticipated   Induction: Intravenous  PONV Risk Score and Plan: 1 and Propofol infusion and TIVA  Airway Management Planned: Natural Airway and Nasal Cannula  Additional Equipment:   Intra-op Plan:   Post-operative Plan:   Informed Consent: I have reviewed the patients History and Physical, chart, labs and discussed the procedure including the risks, benefits and alternatives for the proposed anesthesia with the patient or authorized representative who has indicated his/her understanding and acceptance.     Dental Advisory Given  Plan Discussed with: Anesthesiologist, CRNA and Surgeon  Anesthesia Plan Comments: (Patient consented for risks of anesthesia including but not limited to:  -  adverse reactions to medications - risk of airway placement if required - damage to eyes, teeth, lips or other oral mucosa - nerve damage due to positioning  - sore throat or hoarseness - Damage to heart, brain, nerves, lungs, other parts of body or loss of life  Patient voiced understanding.)        Anesthesia Quick Evaluation

## 2022-04-23 ENCOUNTER — Encounter: Payer: Self-pay | Admitting: Gastroenterology

## 2022-04-23 LAB — SURGICAL PATHOLOGY

## 2022-04-24 ENCOUNTER — Other Ambulatory Visit: Payer: Self-pay | Admitting: *Deleted

## 2022-04-24 ENCOUNTER — Ambulatory Visit (INDEPENDENT_AMBULATORY_CARE_PROVIDER_SITE_OTHER): Payer: No Typology Code available for payment source | Admitting: Urology

## 2022-04-24 ENCOUNTER — Encounter: Payer: Self-pay | Admitting: Urology

## 2022-04-24 VITALS — BP 131/81 | HR 68 | Ht 70.0 in | Wt 185.0 lb

## 2022-04-24 DIAGNOSIS — N5231 Erectile dysfunction following radical prostatectomy: Secondary | ICD-10-CM

## 2022-04-24 DIAGNOSIS — Z125 Encounter for screening for malignant neoplasm of prostate: Secondary | ICD-10-CM

## 2022-04-24 DIAGNOSIS — Z8546 Personal history of malignant neoplasm of prostate: Secondary | ICD-10-CM

## 2022-04-24 MED ORDER — SILDENAFIL CITRATE 20 MG PO TABS: 20.0000 mg | ORAL_TABLET | Freq: Every day | ORAL | 3 refills | Status: DC

## 2022-04-24 MED ORDER — AMBULATORY NON FORMULARY MEDICATION: 3 refills | Status: DC

## 2022-04-24 MED ORDER — AMBULATORY NON FORMULARY MEDICATION: 6 refills | Status: DC

## 2022-04-24 NOTE — Addendum Note (Signed)
Addended by: Kyra Manges on: 04/24/2022 01:22 PM   Modules accepted: Orders

## 2022-04-24 NOTE — Progress Notes (Signed)
04/24/2022 8:07 AM   Edward Mccoy 1951/01/25 [id number]  Referring provider: [name]., MD [address] [place],  [place] [address]  Chief Complaint  Patient presents with   Prostate Cancer    Urologic history: 1. pT2c Gleason 3+4 adenocarcinoma the prostate - RALP 09/2006; PSA remains undetectable   2.  Post prostatectomy erectile dysfunction - Trimix/sildenafil   HPI: 71 y.o. male presents for annual follow-up.  Doing well since last visit No bothersome LUTS Denies dysuria, gross hematuria Denies flank, abdominal or pelvic pain PSA last year was 0.2.  He is scheduled for a PSA next week with Dr. Rosanna Randy Has noted his Trimix efficacy is decreasing.  Sometimes he will lose the erection shortly after the injection.  He is injecting 0.9 cc   PMH: Past Medical History:  Diagnosis Date   Cancer [facility]) 2008   prostate   Cancer of appendix ([place]) 11/15/14   high-grade appendiceal mucinous neoplasm   Colon polyp    ED (erectile dysfunction)    Hyperlipidemia    MRSA infection 05/2015   Right Kneee    Surgical History: Past Surgical History:  Procedure Laterality Date   APPENDECTOMY  11/15/14   patient report it was cancerous    COLONOSCOPY  07-09-06   Dr Allen Norris   COLONOSCOPY WITH PROPOFOL N/A 11/12/2016   Procedure: COLONOSCOPY WITH PROPOFOL;  Surgeon: Christene Lye, MD;  Location: [facility] ENDOSCOPY;  Service: Endoscopy;  Laterality: N/A;   COLONOSCOPY WITH PROPOFOL N/A 04/22/2022   Procedure: COLONOSCOPY WITH PROPOFOL;  Surgeon: Lucilla Lame, MD;  Location: [facility] ENDOSCOPY;  Service: Endoscopy;  Laterality: N/A;   PROSTATE SURGERY  June 2008   SHOULDER SURGERY  Nov 1999, Oct 2015    Home Medications:  Allergies as of 04/24/2022   No Known Allergies      Medication List        Accurate as of April 24, 2022  8:07 AM. If you have any questions, ask your nurse or doctor.          STOP taking these medications    Coenzyme  Q10 100 MG capsule Stopped by: [name], MD   vitamin C 1000 MG tablet Stopped by: [name], MD   vitamin E 180 MG (400 UNITS) capsule Stopped by: [name], MD       TAKE these medications    AMBULATORY NON FORMULARY MEDICATION Trimix 75/2.5/25 -(Pap/Phent/PGE)  Dosage: Inject 0.5-1 cc per injection   Vial 22m  Qty #10 [facility] [place]  Phone: [phone]   aspirin EC 81 MG tablet Take 81 mg by mouth daily.   celecoxib 200 MG capsule Commonly known as: CELEBREX TAKE 1 CAPSULE BY MOUTH EVERY DAY AS NEEDED   Fish Oil 1200 MG Caps Take by mouth daily.   MULTIVITAMIN ADULT PO Take by mouth daily.   mupirocin ointment 2 % Commonly known as: BACTROBAN Apply 1 application topically as directed. In nasal twice weekly.   NON FORMULARY Beet juice daily   sildenafil 20 MG tablet Commonly known as: REVATIO Take 1 tablet (20 mg total) by mouth daily.   simvastatin 10 MG tablet Commonly known as: ZOCOR TAKE 1 TABLET BY MOUTH EVERYDAY AT BEDTIME        Allergies: No Known Allergies  Family History: Family History  Problem Relation Age of Onset   Cancer Mother        unknown type of  ca   Diabetes Father    Parkinson's disease Father     Social History:  reports that he has never smoked. He has never used smokeless tobacco. He reports current alcohol use. He reports that he does not use drugs.   Physical Exam: BP 131/81   Pulse 68   Ht '5\' 10"'$  (1.778 m)   Wt 185 lb (83.9 kg)   BMI 26.54 kg/m   Constitutional:  Alert and oriented, No acute distress. HEENT: [place] AT Respiratory: Normal respiratory effort, no increased work of breathing. Psychiatric: Normal mood and affect.   Assessment & Plan:    1.  History prostate cancer PSA detectable last year and he is scheduled to have drawn next week If rising will schedule PSMA/PET   2.  Erectile dysfunction Sildenafil  refilled Standard Trimix with decreased efficacy.  Rx super Trimix 150/5/50 sent to [facility].  Start at 0.2 cc and increase by 0.2 cc to titrate erections lasting 30- 60 minutes    [name], MD  [facility] [address] [place],  [id number] [phone]

## 2022-04-25 LAB — PSA: Prostate Specific Ag, Serum: 0.1 ng/mL (ref 0.0–4.0)

## 2022-04-28 ENCOUNTER — Encounter: Payer: Self-pay | Admitting: Family Medicine

## 2022-04-30 ENCOUNTER — Encounter: Payer: Medicare HMO | Admitting: Family Medicine

## 2022-05-07 DIAGNOSIS — N529 Male erectile dysfunction, unspecified: Secondary | ICD-10-CM | POA: Diagnosis not present

## 2022-05-07 DIAGNOSIS — Z6826 Body mass index (BMI) 26.0-26.9, adult: Secondary | ICD-10-CM | POA: Diagnosis not present

## 2022-05-07 DIAGNOSIS — N182 Chronic kidney disease, stage 2 (mild): Secondary | ICD-10-CM | POA: Diagnosis not present

## 2022-05-07 DIAGNOSIS — E663 Overweight: Secondary | ICD-10-CM | POA: Diagnosis not present

## 2022-05-07 DIAGNOSIS — Z8546 Personal history of malignant neoplasm of prostate: Secondary | ICD-10-CM | POA: Diagnosis not present

## 2022-05-07 DIAGNOSIS — E785 Hyperlipidemia, unspecified: Secondary | ICD-10-CM | POA: Diagnosis not present

## 2022-05-07 DIAGNOSIS — Z8589 Personal history of malignant neoplasm of other organs and systems: Secondary | ICD-10-CM | POA: Diagnosis not present

## 2022-05-07 DIAGNOSIS — Z008 Encounter for other general examination: Secondary | ICD-10-CM | POA: Diagnosis not present

## 2022-05-12 DIAGNOSIS — H2512 Age-related nuclear cataract, left eye: Secondary | ICD-10-CM | POA: Diagnosis not present

## 2022-05-20 ENCOUNTER — Encounter: Payer: Self-pay | Admitting: Ophthalmology

## 2022-05-27 NOTE — Discharge Instructions (Signed)

## 2022-05-28 ENCOUNTER — Ambulatory Visit: Payer: No Typology Code available for payment source | Admitting: General Practice

## 2022-05-28 ENCOUNTER — Encounter: Payer: Self-pay | Admitting: Ophthalmology

## 2022-05-28 ENCOUNTER — Other Ambulatory Visit: Payer: Self-pay

## 2022-05-28 ENCOUNTER — Ambulatory Visit
Admission: RE | Admit: 2022-05-28 | Discharge: 2022-05-28 | Disposition: A | Discharge status: Home / Self Care | Payer: No Typology Code available for payment source | Attending: Ophthalmology | Admitting: Ophthalmology

## 2022-05-28 ENCOUNTER — Encounter
Admission: RE | Disposition: A | Discharge status: Home / Self Care | Payer: Self-pay | Source: Home / Self Care | Attending: Ophthalmology

## 2022-05-28 DIAGNOSIS — M1990 Unspecified osteoarthritis, unspecified site: Secondary | ICD-10-CM | POA: Insufficient documentation

## 2022-05-28 DIAGNOSIS — E785 Hyperlipidemia, unspecified: Secondary | ICD-10-CM | POA: Diagnosis not present

## 2022-05-28 DIAGNOSIS — R7303 Prediabetes: Secondary | ICD-10-CM

## 2022-05-28 DIAGNOSIS — I4892 Unspecified atrial flutter: Secondary | ICD-10-CM

## 2022-05-28 DIAGNOSIS — H2512 Age-related nuclear cataract, left eye: Secondary | ICD-10-CM | POA: Insufficient documentation

## 2022-05-28 HISTORY — DX: Unspecified atrial flutter: I48.92

## 2022-05-28 HISTORY — PX: CATARACT EXTRACTION W/PHACO: SHX586

## 2022-05-28 SURGERY — PHACOEMULSIFICATION, CATARACT, WITH IOL INSERTION
Anesthesia: Monitor Anesthesia Care | Site: Eye | Laterality: Left

## 2022-05-28 MED ORDER — SIGHTPATH DOSE#1 BSS IO SOLN
INTRAOCULAR | Status: DC | PRN
  Administered 2022-05-28: 15 mL

## 2022-05-28 MED ORDER — SIGHTPATH DOSE#1 BSS IO SOLN
INTRAOCULAR | Status: DC | PRN
  Administered 2022-05-28: 1 mL via INTRAMUSCULAR

## 2022-05-28 MED ORDER — ARMC OPHTHALMIC DILATING DROPS
1.0000 | OPHTHALMIC | Status: DC | PRN
  Administered 2022-05-28 (×3): 1 via OPHTHALMIC

## 2022-05-28 MED ORDER — BRIMONIDINE TARTRATE-TIMOLOL 0.2-0.5 % OP SOLN
OPHTHALMIC | Status: DC | PRN
  Administered 2022-05-28: 1 [drp] via OPHTHALMIC

## 2022-05-28 MED ORDER — TETRACAINE HCL 0.5 % OP SOLN
1.0000 [drp] | OPHTHALMIC | Status: DC | PRN
  Administered 2022-05-28 (×3): 1 [drp] via OPHTHALMIC

## 2022-05-28 MED ORDER — CEFUROXIME OPHTHALMIC INJECTION 1 MG/0.1 ML
INJECTION | OPHTHALMIC | Status: DC | PRN
  Administered 2022-05-28: 0.1 mL via INTRACAMERAL

## 2022-05-28 MED ORDER — LACTATED RINGERS IV SOLN: INTRAVENOUS | Status: DC

## 2022-05-28 MED ORDER — FENTANYL CITRATE (PF) 100 MCG/2ML IJ SOLN
INTRAMUSCULAR | Status: DC | PRN
  Administered 2022-05-28 (×2): 50 ug via INTRAVENOUS

## 2022-05-28 MED ORDER — SIGHTPATH DOSE#1 NA HYALUR & NA CHOND-NA HYALUR IO KIT
PACK | INTRAOCULAR | Status: DC | PRN
  Administered 2022-05-28: 1 via OPHTHALMIC

## 2022-05-28 MED ORDER — MIDAZOLAM HCL 2 MG/2ML IJ SOLN
INTRAMUSCULAR | Status: DC | PRN
  Administered 2022-05-28: 2 mg via INTRAVENOUS

## 2022-05-28 MED ORDER — SIGHTPATH DOSE#1 BSS IO SOLN
INTRAOCULAR | Status: DC | PRN
  Administered 2022-05-28: 78 mL via OPHTHALMIC

## 2022-05-28 SURGICAL SUPPLY — 12 items
CATARACT SUITE SIGHTPATH (MISCELLANEOUS) ×1 IMPLANT
FEE CATARACT SUITE SIGHTPATH (MISCELLANEOUS) ×1 IMPLANT
GLOVE SRG 8 PF TXTR STRL LF DI (GLOVE) ×1 IMPLANT
GLOVE SURG ENC TEXT LTX SZ7.5 (GLOVE) ×1 IMPLANT
GLOVE SURG UNDER POLY LF SZ8 (GLOVE) ×1
LENS IOL EYHANCE TORIC II 16.0 ×1 IMPLANT
LENS IOL EYHANCE TRC 150 16.0 IMPLANT
LENS IOL EYHNC TORIC 150 16.0 ×1 IMPLANT
NDL FILTER BLUNT 18X1 1/2 (NEEDLE) ×1 IMPLANT
NEEDLE FILTER BLUNT 18X1 1/2 (NEEDLE) ×1 IMPLANT
SYR 3ML LL SCALE MARK (SYRINGE) ×1 IMPLANT
WATER STERILE IRR 250ML POUR (IV SOLUTION) ×1 IMPLANT

## 2022-05-28 NOTE — Anesthesia Postprocedure Evaluation (Signed)
Anesthesia Post Note  Patient: Edward Mccoy  Procedure(s) Performed: CATARACT EXTRACTION PHACO AND INTRAOCULAR LENS PLACEMENT (IOC) LEFT EYHANCE TORIC 8.40 01:20.7 (Left: Eye)  Patient location during evaluation: PACU Anesthesia Type: MAC Level of consciousness: awake and alert Pain management: pain level controlled Vital Signs Assessment: post-procedure vital signs reviewed and stable Respiratory status: spontaneous breathing, nonlabored ventilation, respiratory function stable and patient connected to nasal cannula oxygen Cardiovascular status: stable and blood pressure returned to baseline Postop Assessment: no apparent nausea or vomiting Anesthetic complications: no Comments: Patient went into new onset atrial flutter intraop, but went back into normal sinus within a couple of minutes of getting to the PACU.  Called cardiology who said that he would be fine being seen in the clinic tomorrow morning, so the appointment was made.  Patient was stable and in NSR on discharge.   No notable events documented.   Last Vitals:  Vitals:   05/28/22 1330 05/28/22 1340  BP: 109/73 113/75  Pulse: (!) 58 (!) 57  Resp: (!) 22 18  Temp: (!) 36.2 C (!) 36.3 C  SpO2: 96% 98%    Last Pain:  Vitals:   05/28/22 1340  TempSrc:   PainSc: 0-No pain                 Martha Clan

## 2022-05-28 NOTE — Anesthesia Preprocedure Evaluation (Signed)
Anesthesia Evaluation  Patient identified by MRN, date of birth, ID band Patient awake    Reviewed: Allergy & Precautions, NPO status , Patient's Chart, lab work & pertinent test results  History of Anesthesia Complications Negative for: history of anesthetic complications  Airway Mallampati: II  TM Distance: >3 FB Neck ROM: Full    Dental  (+) Poor Dentition   Pulmonary neg pulmonary ROS,    Pulmonary exam normal breath sounds clear to auscultation       Cardiovascular negative cardio ROS Normal cardiovascular exam Rhythm:Regular Rate:Normal     Neuro/Psych negative neurological ROS  negative psych ROS   GI/Hepatic negative GI ROS, Neg liver ROS,   Endo/Other  negative endocrine ROS  Renal/GU negative Renal ROS  negative genitourinary   Musculoskeletal  (+) Arthritis , Osteoarthritis,    Abdominal   Peds negative pediatric ROS (+)  Hematology negative hematology ROS (+)   Anesthesia Other Findings Past Medical History: 2008: Cancer [facility])     Comment:  prostate 11/15/14: Cancer of appendix [facility])     Comment:  high-grade appendiceal mucinous neoplasm No date: Colon polyp No date: ED (erectile dysfunction) No date: Hyperlipidemia 05/2015: MRSA infection     Comment:  Right Kneee   Reproductive/Obstetrics negative OB ROS                             Anesthesia Physical  Anesthesia Plan  ASA: 2  Anesthesia Plan: MAC   Post-op Pain Management: Minimal or no pain anticipated   Induction: Intravenous  PONV Risk Score and Plan: 1 and Midazolam and Treatment may vary due to age or medical condition  Airway Management Planned: Natural Airway and Nasal Cannula  Additional Equipment:   Intra-op Plan:   Post-operative Plan:   Informed Consent: I have reviewed the patients History and Physical, chart, labs and discussed the procedure including the risks, benefits and  alternatives for the proposed anesthesia with the patient or authorized representative who has indicated his/her understanding and acceptance.     Dental Advisory Given  Plan Discussed with: Anesthesiologist, CRNA and Surgeon  Anesthesia Plan Comments: (Patient consented for risks of anesthesia including but not limited to:  - adverse reactions to medications - risk of airway placement if required - damage to eyes, teeth, lips or other oral mucosa - nerve damage due to positioning  - sore throat or hoarseness - Damage to heart, brain, nerves, lungs, other parts of body or loss of life  Patient voiced understanding.)        Anesthesia Quick Evaluation

## 2022-05-28 NOTE — Op Note (Signed)
  LOCATION:  [place]   PREOPERATIVE DIAGNOSIS:  Nuclear sclerotic cataract of the left eye.  H25.12  POSTOPERATIVE DIAGNOSIS:  Nuclear sclerotic cataract of the left eye.   PROCEDURE:  Phacoemulsification with Toric posterior chamber intraocular lens placement of the left eye.  Ultrasound time: Procedure(s): CATARACT EXTRACTION PHACO AND INTRAOCULAR LENS PLACEMENT (IOC) LEFT EYHANCE TORIC 8.40 01:20.7 (Left)  LENS:   Implant Name Type Inv. Item Serial No. Manufacturer Lot No. LRB No. Used Action  LENS IOL EYHANCE TORIC II 16.0 - [id number]  LENS IOL EYHANCE TORIC II 16.0 [id number] [employer]  Left 1 Implanted     [id number] Toric intraocular lens with 1.5 diopters of cylindrical power with axis orientation at 2 degrees.     SURGEON:  Wyonia Hough, MD   ANESTHESIA:  Topical with tetracaine drops and 2% Xylocaine jelly, augmented with 1% preservative-free intracameral lidocaine.  COMPLICATIONS:  None.   DESCRIPTION OF PROCEDURE:  The patient was identified in the holding room and transported to the operating suite and placed in the supine position under the operating microscope.  The left eye was identified as the operative eye, and it was prepped and draped in the usual sterile ophthalmic fashion.    A clear-corneal paracentesis incision was made at the 1:30 position.  0.5 ml of preservative-free 1% lidocaine was injected into the anterior chamber. The anterior chamber was filled with Viscoat.  A 2.4 millimeter near clear corneal incision was then made at the 10:30 position.  A cystotome and capsulorrhexis forceps were then used to make a curvilinear capsulorrhexis.  Hydrodissection and hydrodelineation were then performed using balanced salt solution.   Phacoemulsification was then used in stop and chop fashion to remove the lens, nucleus and epinucleus.  The remaining cortex was aspirated using the irrigation and aspiration handpiece.  Provisc viscoelastic was then  placed into the capsular bag to distend it for lens placement.  The Verion digital marker was used to align the implant at the intended axis.   A Toric lens was then injected into the capsular bag.  It was rotated clockwise until the axis marks on the lens were approximately 15 degrees in the counterclockwise direction to the intended alignment.  The viscoelastic was aspirated from the eye using the irrigation aspiration handpiece.  Then, a Koch spatula through the sideport incision was used to rotate the lens in a clockwise direction until the axis markings of the intraocular lens were lined up with the Verion alignment.  Balanced salt solution was then used to hydrate the wounds. Cefuroxime 0.1 ml of a '10mg'$ /ml solution was injected into the anterior chamber for a dose of 1 mg of intracameral antibiotic at the completion of the case.    The eye was noted to have a physiologic pressure and there was no wound leak noted.   Timolol and Brimonidine drops were applied to the eye.  The patient was taken to the recovery room in stable condition having had no complications of anesthesia or surgery.  Jeniece Hannis 05/28/2022, 1:23 PM

## 2022-05-28 NOTE — Transfer of Care (Signed)
Immediate Anesthesia Transfer of Care Note  Patient: Edward Mccoy  Procedure(s) Performed: CATARACT EXTRACTION PHACO AND INTRAOCULAR LENS PLACEMENT (IOC) LEFT EYHANCE TORIC 8.40 01:20.7 (Left: Eye)  Patient Location: PACU  Anesthesia Type: MAC  Level of Consciousness: awake, alert  and patient cooperative  Airway and Oxygen Therapy: Patient Spontanous Breathing and Patient connected to supplemental oxygen  Post-op Assessment: Post-op Vital signs reviewed, Patient's Cardiovascular Status Stable, Respiratory Function Stable, Patent Airway and No signs of Nausea or vomiting  Post-op Vital Signs: Reviewed and stable  Complications: No notable events documented.

## 2022-05-28 NOTE — H&P (Signed)
[facility]   Primary Care Physician:  [name]., MD Ophthalmologist: Dr. Leandrew Koyanagi  Pre-Procedure History & Physical: HPI:  Edward Mccoy is a 71 y.o. male here for ophthalmic surgery.   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   Colon polyp    ED (erectile dysfunction)    Hyperlipidemia    MRSA infection 05/2015   Right Kneee    Past Surgical History:  Procedure Laterality Date   APPENDECTOMY  11/15/14   patient report it was cancerous    COLONOSCOPY  07-09-06   Dr Allen Norris   COLONOSCOPY WITH PROPOFOL N/A 11/12/2016   Procedure: COLONOSCOPY WITH PROPOFOL;  Surgeon: Christene Lye, MD;  Location: [facility] ENDOSCOPY;  Service: Endoscopy;  Laterality: N/A;   COLONOSCOPY WITH PROPOFOL N/A 04/22/2022   Procedure: COLONOSCOPY WITH PROPOFOL;  Surgeon: Lucilla Lame, MD;  Location: [facility] ENDOSCOPY;  Service: Endoscopy;  Laterality: N/A;   PROSTATE SURGERY  June 2008   SHOULDER SURGERY  Nov 1999, Oct 2015    Prior to Admission medications   Medication Sig Start Date End Date Taking? Authorizing Provider  aspirin EC 81 MG tablet Take 81 mg by mouth daily.   Yes [provider]  celecoxib (CELEBREX) 200 MG capsule TAKE 1 CAPSULE BY MOUTH EVERY DAY AS NEEDED 03/05/22  Yes [name]., MD  Multiple Vitamins-Minerals (MULTIVITAMIN ADULT PO) Take by mouth daily.   Yes [provider]  Omega-3 Fatty Acids (FISH OIL) 1200 MG CAPS Take by mouth daily.    Yes [provider]  simvastatin (ZOCOR) 10 MG tablet TAKE 1 TABLET BY MOUTH EVERYDAY AT BEDTIME 09/04/21  Yes [name]., MD  AMBULATORY NON FORMULARY MEDICATION Trimix 150/5/50 -(Pap/Phent/PGE)  Dosage: Inject 0.2cc as directed, may increase by 0.2cc until erections last 30-60 minutes   Vial 82m  Qty [address]  Phone: [phone]/21/23   StAbbie SonsMD  mupirocin ointment (BACTROBAN) 2 % Apply 1 application topically as directed. In nasal twice weekly.    [provider]  NON FORMULARY Beet juice daily    [provider]  sildenafil (REVATIO) 20 MG tablet Take 1 tablet (20 mg total) by mouth daily. 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  Tobacco Use   Smoking status: Never   Smokeless tobacco: Never  Vaping Use   Vaping Use: Never used  Substance and Sexual Activity   Alcohol use: Yes    Alcohol/week: 0.0 standard drinks of alcohol    Comment: occasionally- beer once a month   Drug use: No   Sexual activity: Yes    Birth control/protection: None  Other Topics Concern   Not on file  Social History Narrative  Not on file   Social Determinants of Health   Financial Resource Strain: Low Risk  (04/23/2020)   Overall Financial Resource Strain (CARDIA)    Difficulty of Paying Living Expenses: Not hard at all  Food Insecurity: No Food Insecurity (04/23/2020)   Hunger Vital Sign    Worried About Running Out of Food in the Last Year: Never true    Ran Out of Food in the Last Year: Never true  Transportation Needs: No Transportation Needs (04/23/2020)   PRAPARE - Hydrologist (Medical): No    Lack of Transportation (Non-Medical): No  Physical Activity: Sufficiently Active (04/23/2020)   Exercise Vital Sign    Days of Exercise per Week: 5 days    Minutes of Exercise per Session:  30 min  Stress: No Stress Concern Present (04/23/2020)   [place]    Feeling of Stress : Not at all  Social Connections: Moderately Isolated (04/23/2020)   Social Connection and Isolation Panel [NHANES]    Frequency of Communication with Friends and Family: More than three times a week    Frequency of Social Gatherings with Friends and Family: More than three times a week    Attends Religious Services: Never    Marine scientist or Organizations: No    Attends Archivist Meetings: Never    Marital Status: Married  Human resources officer Violence: Not At Risk (04/23/2020)   Humiliation, Afraid, Rape, and Kick questionnaire    Fear of Current or Ex-Partner: No    Emotionally Abused: No    Physically Abused: No    Sexually Abused: No    Review of Systems: See HPI, otherwise negative ROS  Physical Exam: BP 133/74   Pulse 70   Temp 97.7 F (36.5 C) (Temporal)   Resp 20   Ht '5\' 10"'$  (1.778 m)   Wt 83 kg   SpO2 100%   BMI 26.26 kg/m  General:   Alert,  pleasant and cooperative in NAD Head:  Normocephalic and atraumatic. Lungs:  Clear to auscultation.    Heart:  Regular rate and rhythm.   Impression/Plan: Edward Mccoy is here for ophthalmic surgery.  Risks, benefits, limitations, and alternatives regarding ophthalmic surgery have been reviewed with the patient.  Questions have been answered.  All parties agreeable.   Leandrew Koyanagi, MD  05/28/2022, 11:57 AM

## 2022-05-29 ENCOUNTER — Encounter: Payer: Self-pay | Admitting: Internal Medicine

## 2022-05-29 ENCOUNTER — Ambulatory Visit
Admission: RE | Admit: 2022-05-29 | Discharge: 2022-05-29 | Disposition: A | Discharge status: Home / Self Care | Payer: No Typology Code available for payment source | Source: Ambulatory Visit | Attending: Internal Medicine | Admitting: Internal Medicine

## 2022-05-29 ENCOUNTER — Telehealth: Payer: Self-pay

## 2022-05-29 ENCOUNTER — Ambulatory Visit: Payer: No Typology Code available for payment source | Attending: Internal Medicine | Admitting: Internal Medicine

## 2022-05-29 VITALS — BP 130/80 | HR 60 | Ht 70.0 in | Wt 190.0 lb

## 2022-05-29 DIAGNOSIS — I480 Paroxysmal atrial fibrillation: Secondary | ICD-10-CM

## 2022-05-29 DIAGNOSIS — Z136 Encounter for screening for cardiovascular disorders: Secondary | ICD-10-CM

## 2022-05-29 DIAGNOSIS — I4892 Unspecified atrial flutter: Secondary | ICD-10-CM | POA: Diagnosis not present

## 2022-05-29 NOTE — Progress Notes (Signed)
ELECTROPHYSIOLOGY CONSULT NOTE  Patient ID: Edward Mccoy, MRN: [id number], DOB/AGE: 72-Feb-1952 72 y.o. Admit date: (Not on file) Date of Consult: 05/29/2022  Primary Physician: [name]., MD Primary Cardiologist: new     Edward Mccoy is a 71 y.o. male who is being seen today for the evaluation of atrial flutter at the request of Dr a Rosey Bath.    HPI Edward Mccoy is a 71 y.o. male referred from the outpatient surgical center for atrial flutter identified periprocedurally at the time of cataract surgery.  He has no history of atrial flutter, no palpitations no exercise intolerance lightheadedness.  Fit and without dyspnea or chest discomfort.  No bleeding history.  No transient neurological symptoms i.e. 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   Colon polyp    ED (erectile dysfunction)    Hyperlipidemia    MRSA infection 05/2015   Right Kneee      Surgical History:  Past Surgical History:  Procedure Laterality Date   APPENDECTOMY  11/15/14   patient report it was cancerous    COLONOSCOPY  07-09-06   Dr Allen Norris   COLONOSCOPY WITH PROPOFOL N/A 11/12/2016   Procedure: COLONOSCOPY WITH PROPOFOL;  Surgeon: Christene Lye, MD;  Location: [facility] ENDOSCOPY;  Service: Endoscopy;  Laterality: N/A;   COLONOSCOPY WITH PROPOFOL N/A 04/22/2022   Procedure: COLONOSCOPY WITH PROPOFOL;  Surgeon: Lucilla Lame, MD;  Location: [facility] ENDOSCOPY;  Service: Endoscopy;  Laterality: N/A;   PROSTATE SURGERY  June 2008   SHOULDER SURGERY  Nov 1999, Oct 2015     Home Meds: Current Meds  Medication Sig   AMBULATORY NON FORMULARY MEDICATION Trimix 150/5/50 -(Pap/Phent/PGE)  Dosage: Inject 0.2cc as directed, may increase by 0.2cc until erections last 30-60 minutes   Vial 30m  Qty #[address] [place] Phone: [phone]   aspirin EC 81 MG tablet Take 81 mg by mouth daily.   celecoxib (CELEBREX) 200 MG capsule TAKE 1 CAPSULE BY MOUTH EVERY DAY AS NEEDED   Multiple Vitamins-Minerals (MULTIVITAMIN ADULT PO) Take by mouth daily.   mupirocin ointment (BACTROBAN) 2 % Apply 1 application topically as directed. In nasal twice weekly.   NON FORMULARY Beet juice daily   Omega-3 Fatty Acids (FISH OIL) 1200 MG CAPS Take by mouth daily.    OVER THE COUNTER MEDICATION Balance in Nature fruits & veggies caps daily   sildenafil (REVATIO) 20 MG tablet Take 1 tablet (20 mg total) by mouth daily.   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  Tobacco Use   Smoking status: Never   Smokeless tobacco: Never  Vaping Use   Vaping Use: Never used  Substance and Sexual Activity   Alcohol use: Yes    Alcohol/week: 0.0 standard drinks of alcohol    Comment:  occasionally- beer once a month   Drug use: No   Sexual activity: Yes    Birth control/protection: None  Other Topics Concern   Not on file  Social History Narrative   Not on file   Social Determinants of Health   Financial Resource Strain: Low Risk  (04/23/2020)   Overall Financial Resource Strain (CARDIA)    Difficulty of Paying Living Expenses: Not hard at all  Food Insecurity: No Food Insecurity (04/23/2020)   Hunger Vital Sign    Worried About Running Out of Food in the Last Year: Never true    Ran Out of Food in the Last Year: Never true  Transportation Needs: No Transportation Needs (04/23/2020)   PRAPARE - Hydrologist  (Medical): No    Lack of Transportation (Non-Medical): No  Physical Activity: Sufficiently Active (04/23/2020)   Exercise Vital Sign    Days of Exercise per Week: 5 days    Minutes of Exercise per Session: 30 min  Stress: No Stress Concern Present (04/23/2020)   [place]    Feeling of Stress : Not at all  Social Connections: Moderately Isolated (04/23/2020)   Social Connection and Isolation Panel [NHANES]    Frequency of Communication with Friends and Family: More than three times a week    Frequency of Social Gatherings with Friends and Family: More than three times a week    Attends Religious Services: Never    Marine scientist or Organizations: No    Attends Archivist Meetings: Never    Marital Status: Married  Human resources officer Violence: Not At Risk (04/23/2020)   Humiliation, Afraid, Rape, and Kick questionnaire    Fear of Current or Ex-Partner: No    Emotionally Abused: No    Physically Abused: No    Sexually Abused: No     Family History  Problem Relation Age of Onset   Hyperlipidemia Mother    Cancer Mother        unknown type of ca   Diabetes Father    Parkinson's disease Father      ROS:  Please see the history of present illness.     All other systems reviewed and negative.    Physical Exam: Blood pressure 130/80, pulse 60, height '5\' 10"'$  (1.778 m), weight 190 lb (86.2 kg), SpO2 99 %. General: Well developed, well nourished male in no acute distress. Head: Normocephalic, atraumatic, sclera non-icteric, no xanthomas, nares are without discharge. EENT: normal  Lymph Nodes:  none Neck: Negative for carotid bruits. JVD not elevated. Back:without scoliosis kyphosis Lungs: Clear bilaterally to auscultation without wheezes, rales, or rhonchi. Breathing is unlabored. Heart: RRR with S1 S2. No   murmur . No rubs, or gallops appreciated. Abdomen: Soft, non-tender, non-distended with  normoactive bowel sounds. No hepatomegaly. No rebound/guarding. No obvious abdominal masses. Msk:  Strength and tone appear normal for age. Extremities: No clubbing or cyanosis. No  edema.  Distal pedal pulses are 2+ and equal bilaterally. Skin: Warm and Dry Neuro: Alert and oriented X 3. CN III-XII intact Grossly normal sensory and motor function . Psych:  Responds to questions appropriately with a normal affect.        EKG: sinus with 1 AVB 23/09/40   Assessment and Plan:  Atrial flutter-typical-paroxysmal  First-degree AV block  Patient had atrial flutter.  During my surgery.  No attributable symptoms and was unaware of it as he awakened.  Thus,  it is unclear as to whether he has had it before.  Atrial flutter however is associated with an almost universal risk of recurrence, hence, the thromboembolic risk would be expected to be estimated more with his CHA2DS2-VASc score of 1.  This being the case, guidelines suggest anticoagulation or not with an estimated stroke risk of about 1 %/year.  We have reviewed the potential benefits of anticoagulation with a 7075% reduced risk of thromboembolism and a 0.3% annual risk of intracranial hemorrhage.  He would like to avoid medications, and the alternatives then would be to do nothing or to consider catheter ablation for the elimination of substrate and hopefully there with a reduction in the risk of thromboembolism.  We discussed the potential risks of the procedure including but not limited to stroke death and heart block requiring pacing and the possibility- 30-50%, of atrial fibrillation in the wake of a flutter ablation procedure.  He would like to proceed with catheter ablation.  In anticipation, we will undertake an echocardiogram.  I have asked him to discontinue his aspirin as there is no clear indication for its use for  primary prevention and there is no role for its use and thromboembolic risk reduction either.  As relates to his long  standing statin, I suggested that he undertake a calcium score to see whether it would be necessary or not to continue.  We will arrange.    Virl Axe

## 2022-05-29 NOTE — Patient Instructions (Addendum)
Medication Instructions:  - Your physician recommends that you continue on your current medications as directed. Please refer to the Current Medication list given to you today.  *If you need a refill on your cardiac medications before your next appointment, please call your pharmacy*   Lab Work: - none ordered  If you have labs (blood work) drawn today and your tests are completely normal, you will receive your results only by: [place] (if you have MyChart) OR A paper copy in the mail If you have any lab test that is abnormal or we need to change your treatment, we will call you to review the results.   Testing/Procedures: 1) Echocardiogram: - Your physician has requested that you have an echocardiogram. Echocardiography is a painless test that uses sound waves to create images of your heart. It provides your doctor with information about the size and shape of your heart and how well your heart's chambers and valves are working. This procedure takes approximately one hour. There are no restrictions for this procedure.There is a possibility that an IV may need to be started during your test to inject an image enhancing agent. This is done to obtain more optimal pictures of your heart. Therefore we ask that you do at least drink some water prior to coming in to hydrate your veins.   Please do NOT wear cologne, perfume, aftershave, or lotions (deodorant is allowed). Please arrive 15 minutes prior to your appointment time.   2) CT Coronary Calcium Score:  Your physician has recommended that you have CT Coronary Calcium Score.  - $99 out of pocket cost at the time of your test - Call [phone] to schedule at your convenience.  Location: [facility] [place], [place] [address]   3) Cardiac Ablation with Dr. Cristopher Peru: - Dr. Tanna Furry nurse, Merrilee Seashore, will reach out to you directly about setting this up  Follow-Up: At [facility], you and your health needs are our priority.  As part of our continuing mission to provide you with exceptional heart care, we have created designated Provider Care Teams.  These Care Teams include your primary Cardiologist (physician) and Advanced Practice Providers (APPs -  Physician Assistants and Nurse Practitioners) who all work together to provide you with the care you need, when you need it.  We recommend signing up for the patient portal called "MyChart".  Sign up information is provided on this After Visit Summary.  MyChart is used to connect with patients for Virtual Visits (Telemedicine).  Patients are able to view lab/test results, encounter notes, upcoming appointments, etc.  Non-urgent messages can be sent to your provider as well.   To learn more about what you can do with MyChart, go to [email or web address].    Your next appointment:   Pending your procedure with Dr. Lovena Le   The format for your next appointment:   In Person  Provider:   Virl Axe, MD    Other Instructions  Echocardiogram An echocardiogram is a test that uses sound waves (ultrasound) to produce images of the heart. Images from an echocardiogram can provide important information about: Heart size and shape. The size and thickness and movement of your heart's walls. Heart muscle function and strength. Heart valve function or if you have stenosis. Stenosis is when the heart valves are too narrow. If blood is flowing backward through the heart valves (regurgitation). A tumor or infectious growth around the heart valves. Areas of heart muscle  that are not working well because of poor blood flow or injury from a heart attack. Aneurysm detection. An aneurysm is a weak or damaged part of an artery wall. The wall bulges out from the normal force of blood pumping through the body. Tell a health care provider about: Any allergies you have. All medicines you are taking, including vitamins, herbs, eye  drops, creams, and over-the-counter medicines. Any blood disorders you have. Any surgeries you have had. Any medical conditions you have. Whether you are pregnant or may be pregnant. What are the risks? Generally, this is a safe test. However, problems may occur, including an allergic reaction to dye (contrast) that may be used during the test. What happens before the test? No specific preparation is needed. You may eat and drink normally. What happens during the test?  You will take off your clothes from the waist up and put on a hospital gown. Electrodes or electrocardiogram (ECG)patches may be placed on your chest. The electrodes or patches are then connected to a device that monitors your heart rate and rhythm. You will lie down on a table for an ultrasound exam. A gel will be applied to your chest to help sound waves pass through your skin. A handheld device, called a transducer, will be pressed against your chest and moved over your heart. The transducer produces sound waves that travel to your heart and bounce back (or "echo" back) to the transducer. These sound waves will be captured in real-time and changed into images of your heart that can be viewed on a video monitor. The images will be recorded on a computer and reviewed by your health care provider. You may be asked to change positions or hold your breath for a short time. This makes it easier to get different views or better views of your heart. In some cases, you may receive contrast through an IV in one of your veins. This can improve the quality of the pictures from your heart. The procedure may vary among health care providers and hospitals. What can I expect after the test? You may return to your normal, everyday life, including diet, activities, and medicines, unless your health care provider tells you not to do that. Follow these instructions at home: It is up to you to get the results of your test. Ask your health care  provider, or the department that is doing the test, when your results will be ready. Keep all follow-up visits. This is important. Summary An echocardiogram is a test that uses sound waves (ultrasound) to produce images of the heart. Images from an echocardiogram can provide important information about the size and shape of your heart, heart muscle function, heart valve function, and other possible heart problems. You do not need to do anything to prepare before this test. You may eat and drink normally. After the echocardiogram is completed, you may return to your normal, everyday life, unless your health care provider tells you not to do that. This information is not intended to replace advice given to you by your health care provider. Make sure you discuss any questions you have with your health care provider. Document Revised: 04/03/2021 Document Reviewed: 03/13/2020 Elsevier Patient Education  [place].  Cardiac Ablation Cardiac ablation is a procedure to destroy, or ablate, a small amount of heart tissue that is causing problems. The heart has many electrical connections. Sometimes, these connections are abnormal and can cause the heart to beat very fast or irregularly. Ablating the  abnormal areas can improve the heart's rhythm or return it to normal. Ablation may be done for people who: Have irregular or rapid heartbeats (arrhythmias). Have Wolff-Parkinson-White syndrome. Have taken medicines for an arrhythmia that did not work or caused side effects. Have a high-risk heartbeat that may be life-threatening. Tell a health care provider about: Any allergies you have. All medicines you are taking, including vitamins, herbs, eye drops, creams, and over-the-counter medicines. Any problems you or family members have had with anesthesia. Any bleeding problems you have. Any surgeries you have had. Any medical conditions you have. Whether you are pregnant or may be pregnant. What are  the risks? Your health care provider will talk with you about risks. These may include: Infection. Bruising and bleeding. Stroke or blood clots. Damage to nearby structures or organs. Allergic reaction to medicines or dyes. Needing a pacemaker if the heart gets damaged. A pacemaker is a device that helps the heart beat normally. Failure of the procedure. A repeat procedure may be needed. What happens before the procedure? Medicines Ask your health care provider about: Changing or stopping your regular medicines. These include any heart rhythm medicines, diabetes medicines, or blood thinners you take. Taking medicines such as aspirin and ibuprofen. These medicines can thin your blood. Do not take them unless your health care provider tells you to. Taking over-the-counter medicines, vitamins, herbs, and supplements. General instructions Follow instructions from your health care provider about what you may eat and drink. If you will be going home right after the procedure, plan to have a responsible adult: Take you home from the hospital or clinic. You will not be allowed to drive. Care for you for the time you are told. Ask your health care provider what steps will be taken to prevent infection. What happens during the procedure?  An IV will be inserted into one of your veins. You may be given: A sedative. This helps you relax. Anesthesia. This will: Numb certain areas of your body. An incision will be made in your neck or your groin. A needle will be inserted through the incision and into a large vein in your neck or groin. The small, thin tube (catheter) will be inserted through the needle and moved to your heart. A type of X-ray (fluoroscopy) will be used to help guide the catheter and provide images of the heart on a monitor. Dye may be injected through the catheter to help your surgeon see the area of the heart that needs treatment. Electrical currents will be sent from the  catheter to destroy heart tissue in certain areas. There are three types of energy that may be used to do this: Heat (radiofrequency energy). Laser energy. Extreme cold (cryoablation). When the tissue has been destroyed, the catheter will be removed. Pressure will be held on the insertion area to prevent bleeding. A bandage (dressing) will be placed over the insertion area. The procedure may vary among health care providers and hospitals. What happens after the procedure? Your blood pressure, heart rate and rhythm, breathing rate, and blood oxygen level will be monitored until you leave the hospital or clinic. Your insertion area will be checked for bleeding. You will need to lie still for a few hours. If your groin was used, you will need to keep your leg straight for a few hours after the catheter is removed. This information is not intended to replace advice given to you by your health care provider. Make sure you discuss any questions you have with  your health care provider. Document Revised: 01/07/2022 Document Reviewed: 01/07/2022 Elsevier Patient Education  [place]

## 2022-05-29 NOTE — Telephone Encounter (Signed)
Pt contacted to schedule an Atrial flutter ablation with Dr. Cristopher Peru. Marland Kitchen  Pt chose 07/02/22 procedure date, at 1030am.   Pt wants labs drawn on 11/20 at [facility] on [address].    Unable to contact scheduling this evening, will f/u.   Pt has eye surgery 11/22, will F/u with Dr. Caryl Comes and RN for advice.

## 2022-05-30 NOTE — Addendum Note (Signed)
Addended by: Oleta Mouse C on: 05/30/2022 02:06 PM   Modules accepted: Orders

## 2022-06-03 ENCOUNTER — Other Ambulatory Visit: Payer: Self-pay | Admitting: *Deleted

## 2022-06-03 DIAGNOSIS — H2511 Age-related nuclear cataract, right eye: Secondary | ICD-10-CM | POA: Diagnosis not present

## 2022-06-03 DIAGNOSIS — E7800 Pure hypercholesterolemia, unspecified: Secondary | ICD-10-CM

## 2022-06-03 MED ORDER — SIMVASTATIN 10 MG PO TABS: ORAL_TABLET | ORAL | 3 refills | Status: DC

## 2022-06-03 NOTE — Telephone Encounter (Signed)
Pt called with question regarding Coronary CT score and discontinuing his 81 mg ASA, and Atorvastatin.  Will speak with MD for clarity.    Pt will rescheduled procedure with Dr Wallace Going. Pt wants to get Ablation behind him first.

## 2022-06-04 DIAGNOSIS — L821 Other seborrheic keratosis: Secondary | ICD-10-CM | POA: Diagnosis not present

## 2022-06-04 DIAGNOSIS — D2271 Melanocytic nevi of right lower limb, including hip: Secondary | ICD-10-CM | POA: Diagnosis not present

## 2022-06-04 DIAGNOSIS — D225 Melanocytic nevi of trunk: Secondary | ICD-10-CM | POA: Diagnosis not present

## 2022-06-04 DIAGNOSIS — D2272 Melanocytic nevi of left lower limb, including hip: Secondary | ICD-10-CM | POA: Diagnosis not present

## 2022-06-04 DIAGNOSIS — D2262 Melanocytic nevi of left upper limb, including shoulder: Secondary | ICD-10-CM | POA: Diagnosis not present

## 2022-06-04 DIAGNOSIS — C44519 Basal cell carcinoma of skin of other part of trunk: Secondary | ICD-10-CM | POA: Diagnosis not present

## 2022-06-04 DIAGNOSIS — D2261 Melanocytic nevi of right upper limb, including shoulder: Secondary | ICD-10-CM | POA: Diagnosis not present

## 2022-06-04 DIAGNOSIS — D485 Neoplasm of uncertain behavior of skin: Secondary | ICD-10-CM | POA: Diagnosis not present

## 2022-06-04 DIAGNOSIS — L570 Actinic keratosis: Secondary | ICD-10-CM | POA: Diagnosis not present

## 2022-06-05 ENCOUNTER — Telehealth: Payer: Self-pay | Admitting: Internal Medicine

## 2022-06-05 DIAGNOSIS — R931 Abnormal findings on diagnostic imaging of heart and coronary circulation: Secondary | ICD-10-CM

## 2022-06-05 NOTE — Telephone Encounter (Signed)
Late entry: I spoke with the patient by phone on 06/03/22 with the results of his CT calcium score.  I advised him I had reviewed these results again with Dr. Caryl Comes in light of the fact the he has a pending cataract surgery on 06/25/22.  Per Dr. Caryl Comes: - OK to resume ASA after his cataract sugery on 06/25/22  I have advised the patient: 1) Restart ASA 81 mg once daily after his catarct sugery 2) Continue with his Simvastatin 10 mg once daily (he requested a refill and this was sent to his pharmacy on 06/03/22) 3) check a fasting Lipid profile (per the patient, he is due on 06/23/22 for pre-ablation lab work- he will fast for this and I will add a lipid panel on to his labs for Dr. Lovena Le that day 4) Exercise Myoview to ensure no iducible ischemia secondary to his elevated Calcium score  The patient voices understanding and is agreeable. I have discussed an exercise myoview with the patient and he voices understanding and is agreeable.  He is aware that I will have scheduling reach out to him to set this up.  He is going out of town from 06/09/22-06/20/22. He is having cataract surgery on 06/25/22. He is having an echo on 06/24/22 at 7:30 am He is having lab work on 06/23/22 for Dr. Lovena Le. He is having an atrial flutter ablation on 07/02/22.  He is agreeable with trying to co-ordinate his stress test on 11/20 or 11/21 (after the echo).  He is aware that I will send a message to our scheduling team to arrange to have this done and forward his pre-test instructions through his MyChart.   The patient voices understanding and is agreeable.

## 2022-06-05 NOTE — Telephone Encounter (Signed)
  Deboraha Sprang, MD 06/01/2022  4:03 PM EDT     Please Inform Patient that -Ca scan   is very abnormal weith a very high score Three things 1) resume ASA81 2) continue statin and we should get FLipids 3) should have myoview to make sure no inducible ischemia Thanks

## 2022-06-06 ENCOUNTER — Other Ambulatory Visit: Payer: Self-pay | Admitting: *Deleted

## 2022-06-06 DIAGNOSIS — Z01812 Encounter for preprocedural laboratory examination: Secondary | ICD-10-CM

## 2022-06-06 DIAGNOSIS — I4892 Unspecified atrial flutter: Secondary | ICD-10-CM

## 2022-06-06 DIAGNOSIS — R931 Abnormal findings on diagnostic imaging of heart and coronary circulation: Secondary | ICD-10-CM

## 2022-06-06 NOTE — Telephone Encounter (Signed)
Edward Mccoy  Sent: Fri June 06, 2022  9:13 AM  To: Emily Filbert, RN         Message  I have scheduled patient on 11/20. He mentioned you were going to change his lab work appt from [place] to here at [facility]. He also stated a cholesterol order was going to be added    I changed his lab orders and addressed this in a MyChart message to him.

## 2022-06-16 ENCOUNTER — Telehealth: Payer: Self-pay | Admitting: Internal Medicine

## 2022-06-16 NOTE — Telephone Encounter (Signed)
I called and spoke with Edward Mccoy- I advised her that Dr. Caryl Comes is out of the office until Friday 06/20/22, but ask him to please sign off the order at that time.  Edward Mccoy advised that is ok & appreciative of the call back.

## 2022-06-16 NOTE — Telephone Encounter (Signed)
[facility] is calling to get orders for the exercise myoview signed. They states they had sent over orders to be sign on 11/02 with no response.

## 2022-06-18 ENCOUNTER — Encounter: Payer: Self-pay | Admitting: Ophthalmology

## 2022-06-18 ENCOUNTER — Telehealth: Payer: Self-pay | Admitting: *Deleted

## 2022-06-18 NOTE — Telephone Encounter (Signed)
-----   Message from Karen Kitchens, NP sent at 06/18/2022  3:36 PM EST ----- Regarding: Request for pre-operative cardiac clearance Request for pre-operative cardiac clearance:  1. What type of surgery is being performed?  CATARACT EXTRACTION PHACO AND INTRAOCULAR LENS PLACEMENT ([facility]) RIGHT EYHANCE TORIC  2. When is this surgery scheduled?  06/25/2022  3. Type of clearance being requested (medical, pharmacy, both)? MEDICAL Please note, patient is in the midst of a workup with you all. She is scheduled for an ablation procedure in the near future. Patient advising that he was told in the office that he is "ok to have cataract surgery as scheduled" before the completion of the cardiac workup. Please confirm.    4. Are there any medications that need to be held prior to surgery? NONE  5. Practice name and name of physician performing surgery?  Performing surgeon: Dr. Leandrew Koyanagi, MD Requesting clearance: Honor Loh, FNP-C    6. Anesthesia type (none, local, MAC, general)? Topical + conscious sedation  7. What is the office phone and fax number?   Fax: [phone]  ATTENTION: Unable to create telephone message as per your standard workflow. Directed by [facility] providers to send requests for cardiac clearance to this pool for appropriate distribution to provider covering pre-operative clearances.   Honor Loh, MSN, APRN, FNP-C, CEN [facility]  Peri-operative Services Nurse Practitioner Phone: [phone] 06/18/22 3:36 PM

## 2022-06-18 NOTE — Addendum Note (Signed)
Addended by: Deboraha Sprang on: 06/18/2022 02:06 AM   Modules accepted: Orders

## 2022-06-18 NOTE — Telephone Encounter (Signed)
   Patient Name: Edward Mccoy  DOB: 04/02/1951 MRN: [id number]  Primary Cardiologist: None  Chart reviewed as part of pre-operative protocol coverage. Cataract extractions are recognized in guidelines as low risk surgeries that do not typically require specific preoperative testing or holding of blood thinner therapy. Therefore, given past medical history and time since last visit, based on ACC/AHA guidelines, Edward Mccoy would be at acceptable risk for the planned procedure without further cardiovascular testing.   I will route this recommendation to the requesting party via Epic fax function and remove from pre-op pool.  Please call with questions.  Elgie Collard, PA-C 06/18/2022, 3:58 PM

## 2022-06-18 NOTE — Telephone Encounter (Signed)
Help again :((   I looked at a few ways to finid it but couldnot  if you are working either thrusday or Friday can you help me find it do sign Thanks SK

## 2022-06-18 NOTE — Telephone Encounter (Addendum)
Sent you a secure chat, but the order is in your basket under the "co-sign clinic orders" tab.

## 2022-06-20 ENCOUNTER — Other Ambulatory Visit: Payer: Self-pay | Admitting: Internal Medicine

## 2022-06-20 DIAGNOSIS — R931 Abnormal findings on diagnostic imaging of heart and coronary circulation: Secondary | ICD-10-CM

## 2022-06-23 ENCOUNTER — Other Ambulatory Visit
Admission: RE | Admit: 2022-06-23 | Discharge: 2022-06-23 | Disposition: A | Discharge status: Home / Self Care | Payer: No Typology Code available for payment source | Attending: Internal Medicine | Admitting: Internal Medicine

## 2022-06-23 ENCOUNTER — Other Ambulatory Visit: Payer: No Typology Code available for payment source

## 2022-06-23 ENCOUNTER — Encounter
Admission: RE | Admit: 2022-06-23 | Discharge: 2022-06-23 | Disposition: A | Discharge status: Home / Self Care | Payer: No Typology Code available for payment source | Source: Ambulatory Visit | Attending: Internal Medicine | Admitting: Internal Medicine

## 2022-06-23 DIAGNOSIS — I2510 Atherosclerotic heart disease of native coronary artery without angina pectoris: Secondary | ICD-10-CM | POA: Diagnosis not present

## 2022-06-23 DIAGNOSIS — Z136 Encounter for screening for cardiovascular disorders: Secondary | ICD-10-CM | POA: Insufficient documentation

## 2022-06-23 DIAGNOSIS — I4892 Unspecified atrial flutter: Secondary | ICD-10-CM

## 2022-06-23 DIAGNOSIS — R931 Abnormal findings on diagnostic imaging of heart and coronary circulation: Secondary | ICD-10-CM

## 2022-06-23 DIAGNOSIS — Z01812 Encounter for preprocedural laboratory examination: Secondary | ICD-10-CM | POA: Insufficient documentation

## 2022-06-23 LAB — NM MYOCAR MULTI W/SPECT W/WALL MOTION / EF
Angina Index: 0
Duke Treadmill Score: 9
Estimated workload: 10.1
Exercise duration (min): 9 min
Exercise duration (sec): 0 s
LV dias vol: 74 mL (ref 62–150)
LV sys vol: 22 mL
Nuc Stress EF: 70 %
Peak HR: 150 {beats}/min
Percent HR: 101 %
Rest Nuclear Isotope Dose: 11.1 mCi
SDS: 0
SRS: 5
SSS: 1
ST Depression (mm): 0 mm
Stress Nuclear Isotope Dose: 32.7 mCi
TID: 0.87

## 2022-06-23 LAB — LIPID PANEL
Cholesterol: 215 mg/dL — ABNORMAL HIGH (ref 0–200)
HDL: 60 mg/dL (ref >40)
LDL Cholesterol: 133 mg/dL — ABNORMAL HIGH (ref 0–99)
Total CHOL/HDL Ratio: 3.6 RATIO
Triglycerides: 108 mg/dL (ref <150)
VLDL: 22 mg/dL (ref 0–40)

## 2022-06-23 LAB — BASIC METABOLIC PANEL
Anion gap: 8 (ref 5–15)
BUN: 22 mg/dL (ref 8–23)
CO2: 27 mmol/L (ref 22–32)
Calcium: 8.8 mg/dL — ABNORMAL LOW (ref 8.9–10.3)
Chloride: 107 mmol/L (ref 98–111)
Creatinine, Ser: 0.78 mg/dL (ref 0.61–1.24)
GFR, Estimated: >60 mL/min (ref >60)
Glucose, Bld: 101 mg/dL — ABNORMAL HIGH (ref 70–99)
Potassium: 4 mmol/L (ref 3.5–5.1)
Sodium: 142 mmol/L (ref 135–145)

## 2022-06-23 LAB — CBC
HCT: 42.3 % (ref 39.0–52.0)
Hemoglobin: 13.9 g/dL (ref 13.0–17.0)
MCH: 31.4 pg (ref 26.0–34.0)
MCHC: 32.9 g/dL (ref 30.0–36.0)
MCV: 95.5 fL (ref 80.0–100.0)
Platelets: 211 10*3/uL (ref 150–400)
RBC: 4.43 MIL/uL (ref 4.22–5.81)
RDW: 12.1 % (ref 11.5–15.5)
WBC: 5.6 10*3/uL (ref 4.0–10.5)
nRBC: 0 % (ref 0.0–0.2)

## 2022-06-23 MED ORDER — TECHNETIUM TC 99M TETROFOSMIN IV KIT
32.7400 | PACK | Freq: Once | INTRAVENOUS | Status: AC | PRN
  Administered 2022-06-23: 32.74 via INTRAVENOUS

## 2022-06-23 MED ORDER — TECHNETIUM TC 99M TETROFOSMIN IV KIT
11.0600 | PACK | Freq: Once | INTRAVENOUS | Status: AC | PRN
  Administered 2022-06-23: 11.06 via INTRAVENOUS

## 2022-06-24 ENCOUNTER — Telehealth: Payer: Self-pay | Admitting: Internal Medicine

## 2022-06-24 ENCOUNTER — Ambulatory Visit: Payer: No Typology Code available for payment source | Attending: Internal Medicine

## 2022-06-24 DIAGNOSIS — I4892 Unspecified atrial flutter: Secondary | ICD-10-CM | POA: Diagnosis not present

## 2022-06-24 LAB — ECHOCARDIOGRAM COMPLETE
AR max vel: 3.57 cm2
AV Area VTI: 4.08 cm2
AV Area mean vel: 3.7 cm2
AV Mean grad: 1 mmHg
AV Peak grad: 2.1 mmHg
Ao pk vel: 0.73 m/s
Area-P 1/2: 2.32 cm2
Calc EF: 66 %
S' Lateral: 2.4 cm
Single Plane A2C EF: 60.4 %
Single Plane A4C EF: 67.7 %

## 2022-06-24 NOTE — Telephone Encounter (Signed)
Patient calling calling for lab, echo, and stress test results.

## 2022-06-24 NOTE — Telephone Encounter (Signed)
Patient called in for results of tests and discuss necessity of the scheduled aflutter ablation. Patient does not want to have this procedure unless it is absolutely necessary. He states he feels fine, he is active and he was told that he only had one episode of aflutter.  I advised that Dr Caryl Comes is out of the office today and we may not be able to address his concerns until  Monday 06/30/22 or Tuesday 07/01/22. Patient verbalized understanding.

## 2022-06-24 NOTE — Discharge Instructions (Signed)

## 2022-06-25 ENCOUNTER — Encounter: Payer: Self-pay | Admitting: Ophthalmology

## 2022-06-25 ENCOUNTER — Encounter
Admission: RE | Disposition: A | Discharge status: Home / Self Care | Payer: Self-pay | Source: Home / Self Care | Attending: Ophthalmology

## 2022-06-25 ENCOUNTER — Ambulatory Visit: Payer: No Typology Code available for payment source | Admitting: Anesthesiology

## 2022-06-25 ENCOUNTER — Ambulatory Visit
Admission: RE | Admit: 2022-06-25 | Discharge: 2022-06-25 | Disposition: A | Discharge status: Home / Self Care | Payer: No Typology Code available for payment source | Attending: Ophthalmology | Admitting: Ophthalmology

## 2022-06-25 ENCOUNTER — Other Ambulatory Visit: Payer: Self-pay

## 2022-06-25 DIAGNOSIS — M1990 Unspecified osteoarthritis, unspecified site: Secondary | ICD-10-CM | POA: Diagnosis not present

## 2022-06-25 DIAGNOSIS — H2511 Age-related nuclear cataract, right eye: Secondary | ICD-10-CM | POA: Insufficient documentation

## 2022-06-25 DIAGNOSIS — H269 Unspecified cataract: Secondary | ICD-10-CM | POA: Diagnosis not present

## 2022-06-25 DIAGNOSIS — I4891 Unspecified atrial fibrillation: Secondary | ICD-10-CM | POA: Diagnosis not present

## 2022-06-25 DIAGNOSIS — E785 Hyperlipidemia, unspecified: Secondary | ICD-10-CM | POA: Diagnosis not present

## 2022-06-25 HISTORY — PX: CATARACT EXTRACTION W/PHACO: SHX586

## 2022-06-25 SURGERY — PHACOEMULSIFICATION, CATARACT, WITH IOL INSERTION
Anesthesia: Monitor Anesthesia Care | Site: Eye | Laterality: Right

## 2022-06-25 MED ORDER — BRIMONIDINE TARTRATE-TIMOLOL 0.2-0.5 % OP SOLN
OPHTHALMIC | Status: DC | PRN
  Administered 2022-06-25: 1 [drp] via OPHTHALMIC

## 2022-06-25 MED ORDER — LACTATED RINGERS IV SOLN: INTRAVENOUS | Status: DC

## 2022-06-25 MED ORDER — TETRACAINE HCL 0.5 % OP SOLN
1.0000 [drp] | OPHTHALMIC | Status: DC | PRN
  Administered 2022-06-25 (×3): 1 [drp] via OPHTHALMIC

## 2022-06-25 MED ORDER — SIGHTPATH DOSE#1 BSS IO SOLN
INTRAOCULAR | Status: DC | PRN
  Administered 2022-06-25: 1 mL via INTRAMUSCULAR

## 2022-06-25 MED ORDER — MIDAZOLAM HCL 2 MG/2ML IJ SOLN
INTRAMUSCULAR | Status: DC | PRN
  Administered 2022-06-25: 2 mg via INTRAVENOUS

## 2022-06-25 MED ORDER — CEFUROXIME OPHTHALMIC INJECTION 1 MG/0.1 ML
INJECTION | OPHTHALMIC | Status: DC | PRN
  Administered 2022-06-25: 1 mg via OPHTHALMIC

## 2022-06-25 MED ORDER — SIGHTPATH DOSE#1 BSS IO SOLN
INTRAOCULAR | Status: DC | PRN
  Administered 2022-06-25: 15 mL

## 2022-06-25 MED ORDER — FENTANYL CITRATE (PF) 100 MCG/2ML IJ SOLN
INTRAMUSCULAR | Status: DC | PRN
  Administered 2022-06-25: 100 ug via INTRAVENOUS

## 2022-06-25 MED ORDER — ARMC OPHTHALMIC DILATING DROPS
1.0000 | OPHTHALMIC | Status: DC | PRN
  Administered 2022-06-25 (×3): 1 via OPHTHALMIC

## 2022-06-25 MED ORDER — SIGHTPATH DOSE#1 BSS IO SOLN
INTRAOCULAR | Status: DC | PRN
  Administered 2022-06-25: 71 mL via OPHTHALMIC

## 2022-06-25 MED ORDER — SIGHTPATH DOSE#1 NA HYALUR & NA CHOND-NA HYALUR IO KIT
PACK | INTRAOCULAR | Status: DC | PRN
  Administered 2022-06-25: 1 via OPHTHALMIC

## 2022-06-25 SURGICAL SUPPLY — 22 items
CANNULA ANT/CHMB 27G (MISCELLANEOUS) IMPLANT
CANNULA ANT/CHMB 27GA (MISCELLANEOUS) IMPLANT
CATARACT SUITE SIGHTPATH (MISCELLANEOUS) ×1 IMPLANT
FEE CATARACT SUITE SIGHTPATH (MISCELLANEOUS) ×1 IMPLANT
GLOVE SRG 8 PF TXTR STRL LF DI (GLOVE) ×1 IMPLANT
GLOVE SURG ENC TEXT LTX SZ7.5 (GLOVE) ×1 IMPLANT
GLOVE SURG GAMMEX PI TX LF 7.5 (GLOVE) IMPLANT
GLOVE SURG UNDER POLY LF SZ8 (GLOVE) ×1
LENS IOL EYHANCE TORIC II 16.5 ×1 IMPLANT
LENS IOL EYHANCE TRC 150 16.5 IMPLANT
LENS IOL EYHNC TORIC 150 16.5 ×1 IMPLANT
NDL FILTER BLUNT 18X1 1/2 (NEEDLE) ×1 IMPLANT
NDL RETROBULBAR .5 NSTRL (NEEDLE) IMPLANT
NEEDLE FILTER BLUNT 18X1 1/2 (NEEDLE) ×1 IMPLANT
PACK VIT ANT 23G (MISCELLANEOUS) IMPLANT
RING MALYGIN 7.0 (MISCELLANEOUS) IMPLANT
SUT ETHILON 10-0 CS-B-6CS-B-6 (SUTURE)
SUT VICRYL  9 0 (SUTURE)
SUT VICRYL 9 0 (SUTURE) IMPLANT
SUTURE EHLN 10-0 CS-B-6CS-B-6 (SUTURE) IMPLANT
SYR 3ML LL SCALE MARK (SYRINGE) ×1 IMPLANT
WATER STERILE IRR 250ML POUR (IV SOLUTION) ×1 IMPLANT

## 2022-06-25 NOTE — Anesthesia Postprocedure Evaluation (Signed)
Anesthesia Post Note  Patient: Edward Mccoy  Procedure(s) Performed: CATARACT EXTRACTION PHACO AND INTRAOCULAR LENS PLACEMENT ([place]) RIGHT EYHANCE TORIC (Right: Eye)  Patient location during evaluation: PACU Anesthesia Type: MAC Level of consciousness: awake and alert Pain management: pain level controlled Vital Signs Assessment: post-procedure vital signs reviewed and stable Respiratory status: spontaneous breathing, nonlabored ventilation, respiratory function stable and patient connected to nasal cannula oxygen Cardiovascular status: stable and blood pressure returned to baseline Postop Assessment: no apparent nausea or vomiting Anesthetic complications: no   No notable events documented.   Last Vitals:  Vitals:   06/25/22 0850 06/25/22 0855  BP: 103/74 112/70  Pulse: (!) 58 (!) 58  Resp: 18 20  Temp: (!) 36.4 C   SpO2: 98% 96%    Last Pain:  Vitals:   06/25/22 0715  TempSrc: Temporal  PainSc: 0-No pain                 Martha Clan

## 2022-06-25 NOTE — H&P (Signed)
[facility]   Primary Care Physician:  Edward Mccoy., MD Ophthalmologist: Dr. Leandrew Mccoy  Pre-Procedure History & Physical: HPI:  Edward Mccoy is a 71 y.o. male here for ophthalmic surgery.   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   Colon polyp    ED (erectile dysfunction)    Hyperlipidemia    MRSA infection 05/2015   Right Kneee    Past Surgical History:  Procedure Laterality Date   APPENDECTOMY  11/15/14   patient report it was cancerous    CATARACT EXTRACTION W/PHACO Left 05/28/2022   Procedure: CATARACT EXTRACTION PHACO AND INTRAOCULAR LENS PLACEMENT ([place]) LEFT EYHANCE TORIC 8.40 01:20.7;  Surgeon: Edward Koyanagi, MD;  Location: [place];  Service: Ophthalmology;  Laterality: Left;   COLONOSCOPY  07-09-06   Dr Edward Mccoy   COLONOSCOPY WITH PROPOFOL N/A 11/12/2016   Procedure: COLONOSCOPY WITH PROPOFOL;  Surgeon: Edward Lye, MD;  Location: [facility] ENDOSCOPY;  Service: Endoscopy;  Laterality: N/A;   COLONOSCOPY WITH PROPOFOL N/A 04/22/2022   Procedure: COLONOSCOPY WITH PROPOFOL;  Surgeon: Edward Lame, MD;  Location: [facility] ENDOSCOPY;  Service: Endoscopy;  Laterality: N/A;   PROSTATE SURGERY  June 2008   SHOULDER SURGERY  Nov 1999, Oct 2015    Prior to Admission medications   Medication Sig Start Date End Date Taking? Authorizing Provider  aspirin EC 81 MG tablet Take 81 mg by mouth daily.   Yes [provider]  celecoxib (CELEBREX) 200 MG capsule TAKE 1 CAPSULE BY MOUTH EVERY DAY AS NEEDED 03/05/22  Yes Edward Mccoy., MD  Multiple Vitamins-Minerals (MULTIVITAMIN ADULT PO) Take by mouth daily.   Yes [provider]  NON FORMULARY Beet juice daily   Yes [provider]  Omega-3 Fatty Acids (FISH OIL) 1200 MG CAPS Take by mouth daily.    Yes [provider]  OVER THE COUNTER MEDICATION Balance in Nature fruits & veggies caps daily   Yes [provider]  simvastatin (ZOCOR) 10 MG tablet TAKE 1 TABLET BY MOUTH EVERYDAY AT BEDTIME 06/03/22  Yes Edward Sprang, MD  AMBULATORY NON FORMULARY MEDICATION Trimix 150/5/50 -(Pap/Phent/PGE)  Dosage: Inject 0.2cc as directed, may increase by 0.2cc until erections last 30-60 minutes   Vial 89m  [place] Phone: [phone]/21/23   StAbbie SonsMD  mupirocin ointment (BACTROBAN) 2 % Apply 1 application topically as directed. In nasal twice weekly.    [provider]  sildenafil (REVATIO) 20 MG tablet Take 1 tablet (20 mg total) by mouth daily. 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  Tobacco Use   Smoking status: Never   Smokeless tobacco: Never  Vaping Use   Vaping Use: Never used  Substance and Sexual Activity   Alcohol use: Yes    Alcohol/week: 0.0 standard drinks of alcohol    Comment: occasionally- beer once a month   Drug use: No   Sexual activity: Yes    Birth control/protection: None  Other Topics Concern   Not on file  Social History Narrative   Not on file   Social Determinants of Health   Financial Resource Strain: Low Risk  (04/23/2020)   Overall Financial Resource Strain (CARDIA)    Difficulty of Paying Living Expenses: Not hard at all  Food Insecurity: No Food Insecurity (04/23/2020)   Hunger Vital  Sign    Worried About Running Out of Food in the Last Year: Never true    Ran Out of Food in the Last Year: Never true  Transportation Needs: No Transportation Needs (04/23/2020)   PRAPARE - Hydrologist (Medical): No    Lack of Transportation (Non-Medical): No  Physical Activity: Sufficiently Active (04/23/2020)   Exercise Vital Sign    Days of Exercise per Week: 5 days    Minutes of Exercise per Session: 30 min  Stress: No Stress Concern Present (04/23/2020)   [place]    Feeling of Stress : Not at all  Social Connections: Moderately Isolated (04/23/2020)   Social Connection and Isolation Panel [NHANES]    Frequency of Communication with Friends and Family: More than three times a week    Frequency of Social Gatherings with Friends and Family: More than three times a week    Attends Religious Services: Never    Marine scientist or Organizations: No    Attends Archivist Meetings: Never    Marital Status: Married  Human resources officer Violence: Not At Risk (04/23/2020)   Humiliation, Afraid, Rape, and Kick questionnaire    Fear of Current or Ex-Partner: No    Emotionally Abused: No    Physically Abused: No    Sexually Abused: No    Review of Systems: See HPI, otherwise negative ROS  Physical Exam: BP 134/86   Pulse 63   Temp 97.8 F (36.6 C) (Temporal)   Resp 18   Ht '5\' 10"'$  (1.778 m)   Wt 86.2 kg   SpO2 98%   BMI 27.27 kg/m  General:   Alert,  pleasant and cooperative in NAD Head:  Normocephalic and atraumatic. Lungs:  Clear to auscultation.    Heart:  Regular rate and rhythm.   Impression/Plan: Edward Mccoy is here for ophthalmic surgery.  Risks, benefits, limitations, and alternatives regarding ophthalmic surgery have been reviewed with the patient.  Questions have been answered.  All parties agreeable.   Edward Koyanagi, MD  06/25/2022, 8:05 AM

## 2022-06-25 NOTE — Anesthesia Preprocedure Evaluation (Addendum)
Anesthesia Evaluation  Patient identified by MRN, date of birth, ID band Patient awake    Reviewed: Allergy & Precautions, NPO status , Patient's Chart, lab work & pertinent test results  History of Anesthesia Complications Negative for: history of anesthetic complications  Airway Mallampati: II  TM Distance: >3 FB Neck ROM: Full    Dental  (+) Poor Dentition   Pulmonary neg pulmonary ROS   Pulmonary exam normal breath sounds clear to auscultation       Cardiovascular Exercise Tolerance: Good (-) hypertension(-) angina (-) Past MI and (-) Cardiac Stents + dysrhythmias (one episode, ablation scheduled for next week) Atrial Fibrillation (-) Valvular Problems/Murmurs Rhythm:Regular Rate:Normal     Neuro/Psych negative neurological ROS  negative psych ROS   GI/Hepatic negative GI ROS, Neg liver ROS,,,  Endo/Other  negative endocrine ROS    Renal/GU negative Renal ROS  negative genitourinary   Musculoskeletal  (+) Arthritis , Osteoarthritis,    Abdominal   Peds negative pediatric ROS (+)  Hematology negative hematology ROS (+)   Anesthesia Other Findings Past Medical History: 2008: Cancer [facility])     Comment:  prostate 11/15/14: Cancer of appendix ([place])     Comment:  high-grade appendiceal mucinous neoplasm No date: Colon polyp No date: ED (erectile dysfunction) No date: Hyperlipidemia 05/2015: MRSA infection     Comment:  Right Kneee   Reproductive/Obstetrics negative OB ROS                             Anesthesia Physical Anesthesia Plan  ASA: 2  Anesthesia Plan: MAC   Post-op Pain Management: Minimal or no pain anticipated   Induction: Intravenous  PONV Risk Score and Plan: 1 and Midazolam and Treatment may vary due to age or medical condition  Airway Management Planned: Natural Airway and Nasal Cannula  Additional Equipment:   Intra-op Plan:   Post-operative Plan:    Informed Consent: I have reviewed the patients History and Physical, chart, labs and discussed the procedure including the risks, benefits and alternatives for the proposed anesthesia with the patient or authorized representative who has indicated his/her understanding and acceptance.     Dental Advisory Given  Plan Discussed with: Anesthesiologist, CRNA and Surgeon  Anesthesia Plan Comments: (Patient consented for risks of anesthesia including but not limited to:  - adverse reactions to medications - risk of airway placement if required - damage to eyes, teeth, lips or other oral mucosa - nerve damage due to positioning  - sore throat or hoarseness - Damage to heart, brain, nerves, lungs, other parts of body or loss of life  Patient voiced understanding.)        Anesthesia Quick Evaluation

## 2022-06-25 NOTE — Op Note (Signed)
  LOCATION:  [place]   PREOPERATIVE DIAGNOSIS:  Nuclear sclerotic cataract of the right eye.  H25.11   POSTOPERATIVE DIAGNOSIS:  Nuclear sclerotic cataract of the right eye.   PROCEDURE:  Phacoemulsification with Toric posterior chamber intraocular lens placement of the right eye.  Ultrasound time: Procedure(s) with comments: CATARACT EXTRACTION PHACO AND INTRAOCULAR LENS PLACEMENT ([facility]) RIGHT EYHANCE TORIC (Right) - 7.76 01:07.9  LENS:   Implant Name Type Inv. Item Serial No. Manufacturer Lot No. LRB No. Used Action  LENS IOL EYHANCE TORIC II 16.5 - [id number]  LENS IOL EYHANCE TORIC II 16.5 [id number] [employer]  Right 1 Implanted     [id number] 16.5D Toric intraocular lens with 1.5 diopters of cylindrical power with axis orientation at 6 degrees.   SURGEON:  Wyonia Hough, MD   ANESTHESIA: Topical with tetracaine drops and 2% Xylocaine jelly, augmented with 1% preservative-free intracameral lidocaine. .   COMPLICATIONS:  None.   DESCRIPTION OF PROCEDURE:  The patient was identified in the holding room and transported to the operating suite and placed in the supine position under the operating microscope.  The right eye was identified as the operative eye, and it was prepped and draped in the usual sterile ophthalmic fashion.    A clear-corneal paracentesis incision was made at the 12:00 position.  0.5 ml of preservative-free 1% lidocaine was injected into the anterior chamber. The anterior chamber was filled with Viscoat.  A 2.4 millimeter near clear corneal incision was then made at the 9:00 position.  A cystotome and capsulorrhexis forceps were then used to make a curvilinear capsulorrhexis.  Hydrodissection and hydrodelineation were then performed using balanced salt solution.   Phacoemulsification was then used in stop and chop fashion to remove the lens, nucleus and epinucleus.  The remaining cortex was aspirated using the irrigation and aspiration handpiece.   Provisc viscoelastic was then placed into the capsular bag to distend it for lens placement.  The Verion digital marker was used to align the implant at the intended axis.   A Toric lens was then injected into the capsular bag.  It was rotated clockwise until the axis marks on the lens were approximately 15 degrees in the counterclockwise direction to the intended alignment.  The viscoelastic was aspirated from the eye using the irrigation aspiration handpiece.  Then, a Koch spatula through the sideport incision was used to rotate the lens in a clockwise direction until the axis markings of the intraocular lens were lined up with the Verion alignment.  Balanced salt solution was then used to hydrate the wounds. Cefuroxime 0.1 ml of a '10mg'$ /ml solution was injected into the anterior chamber for a dose of 1 mg of intracameral antibiotic at the completion of the case.    The eye was noted to have a physiologic pressure and there was no wound leak noted.   Timolol and Brimonidine drops were applied to the eye.  The patient was taken to the recovery room in stable condition having had no complications of anesthesia or surgery.  Anne Boltz 06/25/2022, 8:48 AM

## 2022-06-25 NOTE — Transfer of Care (Signed)
Immediate Anesthesia Transfer of Care Note  Patient: Edward Mccoy  Procedure(s) Performed: CATARACT EXTRACTION PHACO AND INTRAOCULAR LENS PLACEMENT ([facility]) RIGHT EYHANCE TORIC (Right: Eye)  Patient Location: PACU  Anesthesia Type: MAC  Level of Consciousness: awake, alert  and patient cooperative  Airway and Oxygen Therapy: Patient Spontanous Breathing and Patient connected to supplemental oxygen  Post-op Assessment: Post-op Vital signs reviewed, Patient's Cardiovascular Status Stable, Respiratory Function Stable, Patent Airway and No signs of Nausea or vomiting  Post-op Vital Signs: Reviewed and stable  Complications: No notable events documented.

## 2022-07-01 NOTE — Anesthesia Preprocedure Evaluation (Signed)
Anesthesia Evaluation  Patient identified by MRN, date of birth, ID band Patient awake    Reviewed: Allergy & Precautions, NPO status , Patient's Chart, lab work & pertinent test results  History of Anesthesia Complications Negative for: history of anesthetic complications  Airway Mallampati: III  TM Distance: >3 FB Neck ROM: Full    Dental no notable dental hx.    Pulmonary neg pulmonary ROS   Pulmonary exam normal breath sounds clear to auscultation       Cardiovascular (-) hypertension(-) angina (-) Past MI, (-) Cardiac Stents and (-) CABG + dysrhythmias (1st degree AV block) Atrial Fibrillation  Rhythm:Regular Rate:Normal  HLD  TTE 06/24/2022: IMPRESSIONS     1. Left ventricular ejection fraction, by estimation, is 55 to 60%. The  left ventricle has normal function. The left ventricle has no regional  wall motion abnormalities. Left ventricular diastolic parameters are  consistent with Grade I diastolic  dysfunction (impaired relaxation).   2. Right ventricular systolic function is normal. The right ventricular  size is normal.   3. The mitral valve is normal in structure. Mild mitral valve  regurgitation. No evidence of mitral stenosis.   4. The aortic valve is tricuspid. Aortic valve regurgitation is not  visualized. No aortic stenosis is present.   5. There is borderline dilatation of the ascending aorta and of the  aortic root, measuring 38 mm.   6. The inferior vena cava is normal in size with greater than 50%  respiratory variability, suggesting right atrial pressure of 3 mmHg.   NM Myocardial Perfusion Imaging 06/23/2022: low risk  CT Cardiac 05/29/2022: IMPRESSION AND RECOMMENDATION: 1. Coronary calcium score of 823. This was 79th percentile for age and sex matched control.   2. CAC >300 in LAD, LCx, RCA. CAC-DRS A3/N3.   3. Recommend aspirin and statin if no contraindications.   4. Continue heart  healthy lifestyle and risk factor modification.     Neuro/Psych negative neurological ROS     GI/Hepatic negative GI ROS, Neg liver ROS,,,  Endo/Other  Prediabetes, thyroid nodule  Renal/GU negative Renal ROS     Musculoskeletal  (+) Arthritis ,    Abdominal  (+) - obese  Peds  Hematology negative hematology ROS (+)   Anesthesia Other Findings H/o prostate and appendiceal cancer  Reproductive/Obstetrics                             Anesthesia Physical Anesthesia Plan  ASA: 2  Anesthesia Plan: General   Post-op Pain Management:    Induction:   PONV Risk Score and Plan: 2 and Ondansetron, Dexamethasone and Treatment may vary due to age or medical condition  Airway Management Planned: Oral ETT  Additional Equipment:   Intra-op Plan:   Post-operative Plan:   Informed Consent: I have reviewed the patients History and Physical, chart, labs and discussed the procedure including the risks, benefits and alternatives for the proposed anesthesia with the patient or authorized representative who has indicated his/her understanding and acceptance.     Dental advisory given  Plan Discussed with: CRNA and Anesthesiologist  Anesthesia Plan Comments: (Risks of general anesthesia discussed including, but not limited to, sore throat, hoarse voice, chipped/damaged teeth, injury to vocal cords, nausea and vomiting, allergic reactions, lung infection, heart attack, stroke, and death. All questions answered. )        Anesthesia Quick Evaluation

## 2022-07-01 NOTE — Pre-Procedure Instructions (Signed)
Instructed patient on the following items: Arrival time 0630 Nothing to eat or drink after midnight No meds AM of procedure Responsible person to drive you home and stay with you for 24 hrs

## 2022-07-01 NOTE — Telephone Encounter (Signed)
Per Dr. Caryl Comes- he called the patient on 06/30/22 in the PM and discussed his procedure. Dr. Caryl Comes advised the patient is going to proceed with his ablation on 07/02/22 as scheduled.

## 2022-07-02 ENCOUNTER — Ambulatory Visit (HOSPITAL_COMMUNITY)
Admission: RE | Admit: 2022-07-02 | Discharge: 2022-07-02 | Disposition: A | Discharge status: Home / Self Care | Payer: No Typology Code available for payment source | Attending: Internal Medicine | Admitting: Internal Medicine

## 2022-07-02 ENCOUNTER — Other Ambulatory Visit: Payer: Self-pay

## 2022-07-02 ENCOUNTER — Encounter (HOSPITAL_COMMUNITY)
Admission: RE | Disposition: A | Discharge status: Home / Self Care | Payer: Self-pay | Source: Home / Self Care | Attending: Internal Medicine

## 2022-07-02 ENCOUNTER — Ambulatory Visit (HOSPITAL_COMMUNITY): Payer: No Typology Code available for payment source | Admitting: Anesthesiology

## 2022-07-02 ENCOUNTER — Ambulatory Visit (HOSPITAL_BASED_OUTPATIENT_CLINIC_OR_DEPARTMENT_OTHER): Payer: No Typology Code available for payment source | Admitting: Anesthesiology

## 2022-07-02 DIAGNOSIS — I440 Atrioventricular block, first degree: Secondary | ICD-10-CM | POA: Insufficient documentation

## 2022-07-02 DIAGNOSIS — I483 Typical atrial flutter: Secondary | ICD-10-CM | POA: Diagnosis not present

## 2022-07-02 DIAGNOSIS — I4892 Unspecified atrial flutter: Secondary | ICD-10-CM | POA: Insufficient documentation

## 2022-07-02 HISTORY — PX: A-FLUTTER ABLATION: EP1230

## 2022-07-02 SURGERY — A-FLUTTER ABLATION
Anesthesia: General

## 2022-07-02 MED ORDER — HEPARIN SODIUM (PORCINE) 1000 UNIT/ML IJ SOLN
INTRAMUSCULAR | Status: AC
  Filled 2022-07-02: qty 10

## 2022-07-02 MED ORDER — SODIUM CHLORIDE 0.9 % IV SOLN: INTRAVENOUS | Status: DC

## 2022-07-02 MED ORDER — ROCURONIUM BROMIDE 10 MG/ML (PF) SYRINGE
PREFILLED_SYRINGE | INTRAVENOUS | Status: DC | PRN
  Administered 2022-07-02: 70 mg via INTRAVENOUS

## 2022-07-02 MED ORDER — HEPARIN (PORCINE) IN NACL 1000-0.9 UT/500ML-% IV SOLN
INTRAVENOUS | Status: DC | PRN
  Administered 2022-07-02: 500 mL

## 2022-07-02 MED ORDER — PHENYLEPHRINE 80 MCG/ML (10ML) SYRINGE FOR IV PUSH (FOR BLOOD PRESSURE SUPPORT)
PREFILLED_SYRINGE | INTRAVENOUS | Status: DC | PRN
  Administered 2022-07-02: 80 ug via INTRAVENOUS

## 2022-07-02 MED ORDER — MIDAZOLAM HCL 2 MG/2ML IJ SOLN
INTRAMUSCULAR | Status: DC | PRN
  Administered 2022-07-02: 2 mg via INTRAVENOUS

## 2022-07-02 MED ORDER — ONDANSETRON HCL 4 MG/2ML IJ SOLN: 4.0000 mg | Freq: Four times a day (QID) | INTRAMUSCULAR | Status: DC | PRN

## 2022-07-02 MED ORDER — SODIUM CHLORIDE 0.9 % IV SOLN: 250.0000 mL | INTRAVENOUS | Status: DC | PRN

## 2022-07-02 MED ORDER — LIDOCAINE 2% (20 MG/ML) 5 ML SYRINGE
INTRAMUSCULAR | Status: DC | PRN
  Administered 2022-07-02: 50 mg via INTRAVENOUS

## 2022-07-02 MED ORDER — SUGAMMADEX SODIUM 200 MG/2ML IV SOLN
INTRAVENOUS | Status: DC | PRN
  Administered 2022-07-02: 300 mg via INTRAVENOUS

## 2022-07-02 MED ORDER — HEPARIN SODIUM (PORCINE) 1000 UNIT/ML IJ SOLN
INTRAMUSCULAR | Status: DC | PRN
  Administered 2022-07-02: 1000 [IU] via INTRAVENOUS

## 2022-07-02 MED ORDER — ACETAMINOPHEN 325 MG PO TABS: 650.0000 mg | ORAL_TABLET | ORAL | Status: DC | PRN

## 2022-07-02 MED ORDER — SODIUM CHLORIDE 0.9% FLUSH: 3.0000 mL | INTRAVENOUS | Status: DC | PRN

## 2022-07-02 MED ORDER — SODIUM CHLORIDE 0.9% FLUSH: 3.0000 mL | Freq: Two times a day (BID) | INTRAVENOUS | Status: DC

## 2022-07-02 MED ORDER — PROPOFOL 10 MG/ML IV BOLUS
INTRAVENOUS | Status: DC | PRN
  Administered 2022-07-02: 200 mg via INTRAVENOUS

## 2022-07-02 MED ORDER — FENTANYL CITRATE (PF) 250 MCG/5ML IJ SOLN
INTRAMUSCULAR | Status: DC | PRN
  Administered 2022-07-02: 100 ug via INTRAVENOUS

## 2022-07-02 SURGICAL SUPPLY — 12 items
BAG SNAP BAND KOVER 36X36 (MISCELLANEOUS) IMPLANT
CATH JOSEPH QUAD ALLRED 6F REP (CATHETERS) IMPLANT
CATH POLARIS X REPROCESSED (CATHETERS) IMPLANT
CATH SMTCH THERMOCOOL SF FJ (CATHETERS) IMPLANT
PACK EP LATEX FREE (CUSTOM PROCEDURE TRAY) ×1
PACK EP LF (CUSTOM PROCEDURE TRAY) ×1 IMPLANT
PAD DEFIB RADIO PHYSIO CONN (PAD) ×1 IMPLANT
PATCH CARTO3 (PAD) IMPLANT
SHEATH PINNACLE 6F 10CM (SHEATH) IMPLANT
SHEATH PINNACLE 7F 10CM (SHEATH) IMPLANT
SHEATH PINNACLE 8F 10CM (SHEATH) IMPLANT
TUBING SMART ABLATE COOLFLOW (TUBING) IMPLANT

## 2022-07-02 NOTE — Anesthesia Postprocedure Evaluation (Signed)
Anesthesia Post Note  Patient: Edward Mccoy  Procedure(s) Performed: A-FLUTTER ABLATION     Patient location during evaluation: PACU Anesthesia Type: General Level of consciousness: awake Pain management: pain level controlled Vital Signs Assessment: post-procedure vital signs reviewed and stable Respiratory status: spontaneous breathing, nonlabored ventilation and respiratory function stable Cardiovascular status: blood pressure returned to baseline and stable Postop Assessment: no apparent nausea or vomiting Anesthetic complications: no   There were no known notable events for this encounter.  Last Vitals:  Vitals:   07/02/22 1100 07/02/22 1102  BP: 133/74 133/74  Pulse: 68 68  Resp: (!) 21 17  Temp:    SpO2: 97% 97%    Last Pain:  Vitals:   07/02/22 1130  TempSrc:   PainSc: 2                  Nilda Simmer

## 2022-07-02 NOTE — Discharge Instructions (Signed)

## 2022-07-02 NOTE — H&P (Signed)
ELECTROPHYSIOLOGY CONSULT NOTE  Patient ID: Edward Mccoy, MRN: [id number], DOB/AGE: Sep 19, 1950 71 y.o. Admit date: (Not on file) Date of Consult: 05/29/2022   Primary Physician: Edward Mccoy., MD Primary Cardiologist: new     GLENDON Mccoy is a 71 y.o. male who is being seen today for the evaluation of atrial flutter at the request of Dr a Edward Mccoy.      HPI Edward Mccoy is a 71 y.o. male referred from the outpatient surgical center for atrial flutter identified periprocedurally at the time of cataract surgery.   He has no history of atrial flutter, no palpitations no exercise intolerance lightheadedness.   Fit and without dyspnea or chest discomfort.  No bleeding history.  No transient neurological symptoms i.e. 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   Colon polyp     ED (erectile dysfunction)     Hyperlipidemia     MRSA infection 05/2015    Right Kneee       Surgical History:       Past Surgical History:  Procedure Laterality Date   APPENDECTOMY   11/15/14    patient report it was cancerous    COLONOSCOPY   07-09-06    Dr Allen Norris   COLONOSCOPY WITH PROPOFOL N/A 11/12/2016    Procedure: COLONOSCOPY WITH PROPOFOL;  Surgeon: Christene Lye, MD;  Location: [facility] ENDOSCOPY;  Service: Endoscopy;  Laterality: N/A;   COLONOSCOPY WITH PROPOFOL N/A 04/22/2022    Procedure: COLONOSCOPY WITH PROPOFOL;  Surgeon: Lucilla Lame, MD;  Location: [facility] ENDOSCOPY;  Service: Endoscopy;  Laterality: N/A;   PROSTATE SURGERY   June 2008   SHOULDER SURGERY   Nov 1999, Oct 2015      Home Meds: Active Medications      Current Meds  Medication Sig   AMBULATORY NON FORMULARY MEDICATION Trimix 150/5/50 -(Pap/Phent/PGE)   Dosage: Inject 0.2cc as directed, may  increase by 0.2cc until erections last 30-60 minutes     Vial 69m   [place] Phone: [phone]   aspirin EC 81 MG tablet Take 81 mg by mouth daily.   celecoxib (CELEBREX) 200 MG capsule TAKE 1 CAPSULE BY MOUTH EVERY DAY AS NEEDED   Multiple Vitamins-Minerals (MULTIVITAMIN ADULT PO) Take by mouth daily.   mupirocin ointment (BACTROBAN) 2 % Apply 1 application topically as directed. In nasal twice weekly.   NON FORMULARY Beet juice daily   Omega-3 Fatty Acids (FISH OIL) 1200 MG CAPS Take by mouth daily.    OVER THE COUNTER MEDICATION Balance in Nature fruits & veggies caps daily   sildenafil (REVATIO) 20 MG tablet Take 1 tablet (20 mg total) by mouth daily.   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  Tobacco Use   Smoking status: Never   Smokeless tobacco: Never  Vaping Use   Vaping Use: Never used  Substance and Sexual Activity   Alcohol use: Yes      Alcohol/week: 0.0 standard drinks of alcohol      Comment: occasionally- beer once a month   Drug use: No   Sexual activity: Yes      Birth control/protection: None  Other Topics Concern   Not on file  Social History Narrative   Not on file    Social Determinants of Health        Financial Resource Strain: Low Risk  (04/23/2020)    Overall Financial Resource Strain (CARDIA)     Difficulty of Paying Living Expenses: Not hard at all  Food Insecurity: No Food Insecurity (04/23/2020)    Hunger Vital Sign     Worried About Running Out of Food in the Last Year: Never true     Ran Out of Food in the Last Year: Never true   Transportation Needs: No Transportation Needs (04/23/2020)    PRAPARE - Armed forces logistics/support/administrative officer (Medical): No     Lack of Transportation (Non-Medical): No  Physical Activity: Sufficiently Active (04/23/2020)    Exercise Vital Sign     Days of Exercise per Week: 5 days     Minutes of Exercise per Session: 30 min  Stress: No Stress Concern Present (04/23/2020)    [place]     Feeling of Stress : Not at all  Social Connections: Moderately Isolated (04/23/2020)    Social Connection and Isolation Panel [NHANES]     Frequency of Communication with Friends and Family: More than three times a week     Frequency of Social Gatherings with Friends and Family: More than three times a week     Attends Religious Services: Never     Marine scientist or Organizations: No     Attends Archivist Meetings: Never     Marital Status: Married  Human resources officer Violence: Not At Risk (04/23/2020)    Humiliation, Afraid, Rape, and Kick questionnaire     Fear of Current or Ex-Partner: No     Emotionally Abused: No     Physically Abused: No     Sexually Abused: No           Family History  Problem Relation Age of Onset   Hyperlipidemia Mother     Cancer Mother          unknown type of ca   Diabetes Father     Parkinson's disease Father        ROS:  Please see the history of present illness.     All other systems reviewed and negative.      Physical Exam: Blood pressure 130/80, pulse 60, height '5\' 10"'$  (1.778 m), weight 190 lb (86.2 kg), SpO2 99 %. General: Well developed, well nourished male in no acute distress. Head: Normocephalic, atraumatic, sclera non-icteric, no xanthomas, nares are without discharge. EENT: normal  Lymph Nodes:  none Neck: Negative for carotid bruits. JVD not elevated. Back:without scoliosis kyphosis Lungs: Clear bilaterally to auscultation without wheezes, rales, or rhonchi.  Breathing is unlabored. Heart: RRR with S1 S2. No   murmur . No rubs, or gallops appreciated. Abdomen:  Soft, non-tender, non-distended with normoactive bowel sounds. No hepatomegaly. No rebound/guarding. No obvious abdominal masses. Msk:  Strength and tone appear normal for age. Extremities: No clubbing or cyanosis. No  edema.  Distal pedal pulses are 2+ and equal bilaterally. Skin: Warm and Dry Neuro: Alert and oriented X 3. CN III-XII intact Grossly normal sensory and motor function . Psych:  Responds to questions appropriately with a normal affect.                     EKG: sinus with 1 AVB 23/09/40     Assessment and Plan:  Atrial flutter-typical-paroxysmal   First-degree AV block   Patient had atrial flutter.  During my surgery.  No attributable symptoms and was unaware of it as he awakened.  Thus, it is unclear as to whether he has had it before.  Atrial flutter however is associated with an almost universal risk of recurrence, hence, the thromboembolic risk would be expected to be estimated more with his CHA2DS2-VASc score of 1.  This being the case, guidelines suggest anticoagulation or not with an estimated stroke risk of about 1 %/year.  We have reviewed the potential benefits of anticoagulation with a 7075% reduced risk of thromboembolism and a 0.3% annual risk of intracranial hemorrhage.  He would like to avoid medications, and the alternatives then would be to do nothing or to consider catheter ablation for the elimination of substrate and hopefully there with a reduction in the risk of thromboembolism.  We discussed the potential risks of the procedure including but not limited to stroke death and heart block requiring pacing and the possibility- 30-50%, of atrial fibrillation in the wake of a flutter ablation procedure.  He would like to proceed with catheter ablation.   In anticipation, we will undertake an echocardiogram.   I have asked him to discontinue his aspirin as there  is no clear indication for its use for  primary prevention and there is no role for its use and thromboembolic risk reduction either.   As relates to his long standing statin, I suggested that he undertake a calcium score to see whether it would be necessary or not to continue.  We will arrange.       Virl Axe   EP Attending  Patient seen and examined. Discussed with Dr. Renaldo Reel. The patient has typical atrial flutter and desires to undergo catheter ablation. I have reviewed the indications/risks/benefits/goals/expectations of the procedure with the patient and he wishes to proceed.]  Edward Mccoy

## 2022-07-02 NOTE — Anesthesia Procedure Notes (Signed)
Procedure Name: Intubation Date/Time: 07/02/2022 8:30 AM  Performed by: Eligha Bridegroom, CRNAPre-anesthesia Checklist: Patient identified, Emergency Drugs available, Suction available and Patient being monitored Patient Re-evaluated:Patient Re-evaluated prior to induction Oxygen Delivery Method: Circle system utilized Preoxygenation: Pre-oxygenation with 100% oxygen Induction Type: IV induction Ventilation: Mask ventilation without difficulty and Oral airway inserted - appropriate to patient size Laryngoscope Size: Mac and 4 Tube type: Oral Tube size: 7.5 mm Placement Confirmation: ETT inserted through vocal cords under direct vision Secured at: 22 cm Tube secured with: Tape Dental Injury: Teeth and Oropharynx as per pre-operative assessment

## 2022-07-02 NOTE — Progress Notes (Signed)
SITE AREA: right groin/femoral  SITE PRIOR TO REMOVAL:  LEVEL 0  PRESSURE APPLIED FOR: approximately 10 minutes  MANUAL: yes  PATIENT STATUS DURING PULL: stable  POST PULL SITE:  LEVEL 0  POST PULL INSTRUCTIONS GIVEN: yes  POST PULL PULSES PRESENT: bilateral pedal pulses at +2  DRESSING APPLIED: gauze with tegaderm  BEDREST BEGINS @ 1012  COMMENTS:

## 2022-07-02 NOTE — Transfer of Care (Signed)
Immediate Anesthesia Transfer of Care Note  Patient: SHAWNTE DEMAREST  Procedure(s) Performed: A-FLUTTER ABLATION  Patient Location: PACU  Anesthesia Type:General  Level of Consciousness: awake, alert , and oriented  Airway & Oxygen Therapy: Patient Spontanous Breathing  Post-op Assessment: Report given to RN and Post -op Vital signs reviewed and stable  Post vital signs: Reviewed and stable  Last Vitals:  Vitals Value Taken Time  BP 135/76 07/02/22 0951  Temp    Pulse 67 07/02/22 0952  Resp 13 07/02/22 0952  SpO2 99 % 07/02/22 0952  Vitals shown include unvalidated device data.  Last Pain:  Vitals:   07/02/22 0619  TempSrc: Temporal  PainSc:       Patients Stated Pain Goal: 4 (40/10/27 2536)  Complications: There were no known notable events for this encounter.

## 2022-07-03 ENCOUNTER — Encounter (HOSPITAL_COMMUNITY): Payer: Self-pay | Admitting: Internal Medicine

## 2022-07-08 ENCOUNTER — Other Ambulatory Visit: Payer: Self-pay | Admitting: *Deleted

## 2022-07-08 MED ORDER — ATORVASTATIN CALCIUM 40 MG PO TABS: ORAL_TABLET | ORAL | 1 refills | Status: DC

## 2022-08-05 ENCOUNTER — Encounter: Payer: Self-pay | Admitting: Internal Medicine

## 2022-08-05 ENCOUNTER — Ambulatory Visit: Payer: PPO | Attending: Internal Medicine | Admitting: Internal Medicine

## 2022-08-05 VITALS — BP 138/62 | HR 73 | Ht 70.0 in | Wt 188.0 lb

## 2022-08-05 DIAGNOSIS — I4892 Unspecified atrial flutter: Secondary | ICD-10-CM | POA: Diagnosis not present

## 2022-08-05 NOTE — Patient Instructions (Signed)
Medication Instructions:  Your physician recommends that you continue on your current medications as directed. Please refer to the Current Medication list given to you today.    *If you need a refill on your cardiac medications before your next appointment, please call your pharmacy*   Lab Work: NONE If you have labs (blood work) drawn today and your tests are completely normal, you will receive your results only by: [place] (if you have MyChart) OR A paper copy in the mail If you have any lab test that is abnormal or we need to change your treatment, we will call you to review the results.   Testing/Procedures: NONE   Follow-Up: As needed  The format for your next appointment:   In Person  Provider:   You may see Cristopher Peru, MDor one of the following Advanced Practice Providers on your designated Care Team:   Tommye Standard, [place] Legrand Como "Jonni Sanger" Chalmers Cater, [place]     Important Information About Sugar

## 2022-08-05 NOTE — Progress Notes (Signed)
HPI [name] returns today for ongoing evaluation of atrial flutter.  He is a very pleasant 72 year old man with a history of atrial flutter who underwent EP study and catheter ablation approximately 1 month ago.  The patient underwent successful atrial flutter isthmus ablation.  In the interim he has done well with no symptomatic palpitations, chest pain, or shortness of breath.  He remains active.  No syncope.  No peripheral edema. No Known Allergies   Current Outpatient Medications  Medication Sig Dispense Refill   acetaminophen (TYLENOL) 650 MG CR tablet Take 1,300 mg by mouth every 8 (eight) hours as needed for pain.     AMBULATORY NON FORMULARY MEDICATION Trimix 150/5/50 -(Pap/Phent/PGE)  Dosage: Inject 0.2cc as directed, may increase by 0.2cc until erections last 30-60 minutes   Vial 31m  [place] Phone: [phone] 5 mL 3   aspirin EC 81 MG tablet Take 162 mg by mouth daily.     atorvastatin (LIPITOR) 40 MG tablet Take 1 tablet (40 mg) by mouth once daily 90 tablet 1   brimonidine-timolol (COMBIGAN) 0.2-0.5 % ophthalmic solution Place 1 drop into the right eye every 12 (twelve) hours.     celecoxib (CELEBREX) 200 MG capsule TAKE 1 CAPSULE BY MOUTH EVERY DAY AS NEEDED (Patient taking differently: Take 200 mg by mouth daily.) 90 capsule 3   Melatonin 10 MG TABS Take 10 mg by mouth at bedtime as needed (sleep).     Multiple Vitamins-Minerals (MULTIVITAMIN ADULT PO) Take 1 tablet by mouth daily.     mupirocin ointment (BACTROBAN) 2 % Place 1 application  into the nose daily as needed (immune support).     Omega-3 Fatty Acids (FISH OIL) 1200 MG CAPS Take 1,200 mg by mouth daily.     OVER THE COUNTER MEDICATION Take 3 capsules by mouth daily. Balance in Nature fruits     OVER THE COUNTER MEDICATION Take 3 tablets by mouth daily. Balance of Nature veggies     OVER THE COUNTER MEDICATION Take 1 Scoop by  mouth daily. Super Beets     Prednisolon-Moxiflox-Bromfenac 1-0.5-0.075 % SOLN Place 1 drop into the right eye See admin instructions. Instill 1 drop in the right eye 4 times daily for 1 week, twice daily for 1 week, then once daily for 1 week then stop     sildenafil (REVATIO) 20 MG tablet Take 1 tablet (20 mg total) by mouth daily. 90 tablet 3   No current facility-administered medications for this visit.     Past Medical History:  Diagnosis Date   Atrial fl[facility] ([facility]/25/2023   [facility] [place]   prostate   [facility] of [place] ([facility]   high-grade appendiceal mucinous neoplasm   Colon polyp    ED (erectile dysfunction)    Hyperlipidemia    MRSA infection 05/2015   Right Kneee    ROS:   All systems reviewed and negative except as noted in the HPI.   Past Surgical History:  Procedure Laterality Date   A-FLUTTER ABLATION N/A 07/02/2022   Procedure: A-FLUTTER ABLATION;  Surgeon: TaEvans LanceMD;  [facility];  Service: Cardiovascular;  Laterality: N/A;   APPENDECTOMY  11/15/14   patient report it was cancerous    CATARACT EXTRACTION W/PHACO Left 05/28/2022   Procedure: CATARACT EXTRACTION PHACO AND INTRAOCULAR LENS PLACEMENT ([place] EYHANCE TORIC 8.40 01:20.7;  Surgeon: BrLeandrew KoyanagiMD;  Location:  [place];  Service: Ophthalmology;  Laterality: Left;   CATARACT EXTRACTION W/PHACO Right 06/25/2022   Procedure: CATARACT EXTRACTION PHACO AND INTRAOCULAR LENS PLACEMENT ([place]) RIGHT Tyler Deis;  Surgeon: Leandrew Koyanagi, MD;  Location: [place];  Service: Ophthalmology;  Laterality: Right;  7.76 01:07.9   COLONOSCOPY  07-09-06   Dr Allen Norris   COLONOSCOPY WITH PROPOFOL N/A 11/12/2016   Procedure: COLONOSCOPY WITH PROPOFOL;  Surgeon: Christene Lye, MD;  Location: [facility] ENDOSCOPY;  Service: Endoscopy;  Laterality: N/A;   COLONOSCOPY WITH PROPOFOL N/A 04/22/2022   Procedure: COLONOSCOPY WITH PROPOFOL;  Surgeon:  Lucilla Lame, MD;  Location: [facility] ENDOSCOPY;  Service: Endoscopy;  Laterality: N/A;   PROSTATE SURGERY  June 2008   SHOULDER SURGERY  Nov 1999, Oct 2015     Family History  Problem Relation Age of Onset   Hyperlipidemia Mother    Cancer Mother        unknown type of ca   Diabetes Father    Parkinson's disease Father      Social History   Socioeconomic History   Marital status: Married    Spouse name: [name]   Number of children: 1   Years of education: 16   Highest education level: Bachelor's degree (e.g., BA, AB, BS)  Occupational History   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  Tobacco Use   Smoking status: Never   Smokeless tobacco: Never  Vaping Use   Vaping Use: Never used  Substance and Sexual Activity   Alcohol use: Yes    Alcohol/week: 0.0 standard drinks of alcohol    Comment: occasionally- beer once a month   Drug use: No   Sexual activity: Yes    Birth control/protection: None  Other Topics Concern   Not on file  Social History Narrative   Not on file   Social Determinants of Health   Financial Resource Strain: Low Risk  (04/23/2020)   Overall Financial Resource Strain (CARDIA)    Difficulty of Paying Living Expenses: Not hard at all  Food Insecurity: No Food Insecurity (04/23/2020)   Hunger Vital Sign    Worried About Running Out of Food in the Last Year: Never true    Ran Out of Food in the Last Year: Never true  Transportation Needs: No Transportation Needs (04/23/2020)   PRAPARE - Hydrologist (Medical): No    Lack of Transportation (Non-Medical): No  Physical Activity: Sufficiently Active (04/23/2020)   Exercise Vital Sign    Days of Exercise per Week: 5 days    Minutes of Exercise per Session: 30 min  Stress: No Stress Concern Present (04/23/2020)   [place]    Feeling of Stress : Not at all  Social Connections: Moderately Isolated (04/23/2020)   Social Connection  and Isolation Panel [NHANES]    Frequency of Communication with Friends and Family: More than three times a week    Frequency of Social Gatherings with Friends and Family: More than three times a week    Attends Religious Services: Never    Marine scientist or Organizations: No    Attends Archivist Meetings: Never    Marital Status: Married  Human resources officer Violence: Not At Risk (04/23/2020)   Humiliation, Afraid, Rape, and Kick questionnaire    Fear of Current or Ex-Partner: No    Emotionally Abused: No    Physically Abused: No    Sexually Abused: No     BP  138/62   Pulse 73   Ht '5\' 10"'$  (1.778 m)   Wt 188 lb (85.3 kg)   SpO2 97%   BMI 26.98 kg/m   Physical Exam:  Well appearing NAD HEENT: Unremarkable Neck:  No JVD, no thyromegally Lymphatics:  No adenopathy Back:  No CVA tenderness Lungs:  Clear, with no wheezes, rales, or rhonchi. HEART:  Regular rate rhythm, no murmurs, no rubs, no clicks Abd:  soft, positive bowel sounds, no organomegally, no rebound, no guarding Ext:  2 plus pulses, no edema, no cyanosis, no clubbing Skin:  No rashes no nodules Neuro:  CN II through XII intact, motor grossly intact  EKG -normal sinus rhythm.  Assess/Plan: 1.  Atrial flutter.  The patient is maintaining sinus rhythm very nicely after catheter ablation of atrial flutter.  He will undergo watchful waiting.  No change in his medications. 2.  Hypertension.  Mostly his blood pressure is well-controlled.  He will continue his current medications and maintain a low-sodium diet.  Cristopher Peru, MD

## 2022-09-10 DIAGNOSIS — C44519 Basal cell carcinoma of skin of other part of trunk: Secondary | ICD-10-CM | POA: Diagnosis not present

## 2022-09-11 DIAGNOSIS — Z0000 Encounter for general adult medical examination without abnormal findings: Secondary | ICD-10-CM | POA: Diagnosis not present

## 2022-09-11 DIAGNOSIS — E7800 Pure hypercholesterolemia, unspecified: Secondary | ICD-10-CM | POA: Diagnosis not present

## 2022-09-11 DIAGNOSIS — C181 Malignant neoplasm of appendix: Secondary | ICD-10-CM | POA: Diagnosis not present

## 2022-09-11 DIAGNOSIS — Z86010 Personal history of colonic polyps: Secondary | ICD-10-CM | POA: Diagnosis not present

## 2022-09-11 DIAGNOSIS — N529 Male erectile dysfunction, unspecified: Secondary | ICD-10-CM | POA: Diagnosis not present

## 2022-09-24 DIAGNOSIS — C4441 Basal cell carcinoma of skin of scalp and neck: Secondary | ICD-10-CM | POA: Diagnosis not present

## 2022-09-24 DIAGNOSIS — C44519 Basal cell carcinoma of skin of other part of trunk: Secondary | ICD-10-CM | POA: Diagnosis not present

## 2022-11-10 NOTE — Addendum Note (Signed)
Addended by: Duke Salvia on: 11/10/2022 10:18 AM   Modules accepted: Orders

## 2022-11-12 ENCOUNTER — Encounter (HOSPITAL_COMMUNITY): Payer: Self-pay | Admitting: Internal Medicine

## 2022-11-12 ENCOUNTER — Telehealth (HOSPITAL_COMMUNITY): Payer: Self-pay | Admitting: *Deleted

## 2022-11-12 NOTE — Telephone Encounter (Signed)
Letter sent with instructions for upcoming stress test.  Edward Mccoy  

## 2022-11-13 NOTE — Addendum Note (Signed)
Addended by: Alois Cliche on: 11/13/2022 06:02 PM   Modules accepted: Orders

## 2022-11-13 NOTE — Addendum Note (Signed)
Addended by: Duke Salvia on: 11/13/2022 06:28 PM   Modules accepted: Orders

## 2022-11-19 ENCOUNTER — Encounter (HOSPITAL_COMMUNITY): Payer: PPO

## 2022-12-09 DIAGNOSIS — M1711 Unilateral primary osteoarthritis, right knee: Secondary | ICD-10-CM | POA: Diagnosis not present

## 2022-12-19 DIAGNOSIS — D225 Melanocytic nevi of trunk: Secondary | ICD-10-CM | POA: Diagnosis not present

## 2022-12-19 DIAGNOSIS — D2261 Melanocytic nevi of right upper limb, including shoulder: Secondary | ICD-10-CM | POA: Diagnosis not present

## 2022-12-19 DIAGNOSIS — L570 Actinic keratosis: Secondary | ICD-10-CM | POA: Diagnosis not present

## 2022-12-19 DIAGNOSIS — D485 Neoplasm of uncertain behavior of skin: Secondary | ICD-10-CM | POA: Diagnosis not present

## 2022-12-19 DIAGNOSIS — C44519 Basal cell carcinoma of skin of other part of trunk: Secondary | ICD-10-CM | POA: Diagnosis not present

## 2022-12-19 DIAGNOSIS — D2272 Melanocytic nevi of left lower limb, including hip: Secondary | ICD-10-CM | POA: Diagnosis not present

## 2022-12-19 DIAGNOSIS — X32XXXA Exposure to sunlight, initial encounter: Secondary | ICD-10-CM | POA: Diagnosis not present

## 2022-12-19 DIAGNOSIS — L821 Other seborrheic keratosis: Secondary | ICD-10-CM | POA: Diagnosis not present

## 2022-12-19 DIAGNOSIS — D2271 Melanocytic nevi of right lower limb, including hip: Secondary | ICD-10-CM | POA: Diagnosis not present

## 2022-12-19 DIAGNOSIS — D2262 Melanocytic nevi of left upper limb, including shoulder: Secondary | ICD-10-CM | POA: Diagnosis not present

## 2022-12-23 ENCOUNTER — Other Ambulatory Visit: Payer: Self-pay | Admitting: Internal Medicine

## 2023-02-06 DIAGNOSIS — H40003 Preglaucoma, unspecified, bilateral: Secondary | ICD-10-CM | POA: Diagnosis not present

## 2023-02-13 DIAGNOSIS — Z961 Presence of intraocular lens: Secondary | ICD-10-CM | POA: Diagnosis not present

## 2023-02-13 DIAGNOSIS — H40003 Preglaucoma, unspecified, bilateral: Secondary | ICD-10-CM | POA: Diagnosis not present

## 2023-02-13 DIAGNOSIS — H43811 Vitreous degeneration, right eye: Secondary | ICD-10-CM | POA: Diagnosis not present

## 2023-03-04 DIAGNOSIS — C44519 Basal cell carcinoma of skin of other part of trunk: Secondary | ICD-10-CM | POA: Diagnosis not present

## 2023-04-10 ENCOUNTER — Other Ambulatory Visit: Payer: Self-pay | Admitting: Urology

## 2023-04-20 DIAGNOSIS — J0110 Acute frontal sinusitis, unspecified: Secondary | ICD-10-CM | POA: Diagnosis not present

## 2023-04-29 ENCOUNTER — Encounter: Payer: Self-pay | Admitting: Urology

## 2023-04-29 ENCOUNTER — Ambulatory Visit (INDEPENDENT_AMBULATORY_CARE_PROVIDER_SITE_OTHER): Payer: HMO | Admitting: Urology

## 2023-04-29 VITALS — BP 138/70 | HR 74 | Ht 70.0 in | Wt 180.0 lb

## 2023-04-29 DIAGNOSIS — Z8546 Personal history of malignant neoplasm of prostate: Secondary | ICD-10-CM | POA: Diagnosis not present

## 2023-04-29 DIAGNOSIS — N529 Male erectile dysfunction, unspecified: Secondary | ICD-10-CM

## 2023-04-29 NOTE — Progress Notes (Signed)
I, Duke Salvia, acting as a Neurosurgeon for Riki Altes, MD., have documented all relevant documentation on the behalf of Riki Altes, MD, as directed by  Riki Altes, MD while in the presence of Riki Altes, MD.  04/29/2023 9:33 AM   Edward Mccoy Jan 23, 1951 [id number]  Referring provider: Bosie Clos, MD [address] [place],  [place] [address]  Chief Complaint  Patient presents with   Prostate Cancer    Urologic history: 1. pT2c Gleason 3+4 adenocarcinoma the prostate - RALP 09/2006; PSA remains undetectable   2.  Post prostatectomy erectile dysfunction - Trimix/sildenafil   HPI: 72 y.o. male presents for annual follow-up.  Doing well since last visit No bothersome LUTS Denies dysuria, gross hematuria Denies flank, abdominal or pelvic pain PSA last year was 0.1.  No recent PSA drawn Trimix was switched to Super Trimix 150/5/50 last year, with slight improved efficacy. 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   Colon polyp    ED (erectile dysfunction)    Hyperlipidemia    MRSA infection 05/2015   Right Kneee    Surgical History: Past Surgical History:  Procedure Laterality Date   A-FLUTTER ABLATION N/A 07/02/2022   Procedure: A-FLUTTER ABLATION;  Surgeon: Marinus Maw, MD;  [facility];  Service: Cardiovascular;  Laterality: N/A;   APPENDECTOMY  11/15/14   patient report it was cancerous    CATARACT EXTRACTION W/PHACO Left 05/28/2022   Procedure: CATARACT EXTRACTION PHACO AND INTRAOCULAR LENS PLACEMENT (IOC) LEFT EYHANCE TORIC 8.40 01:20.7;  Surgeon: Lockie Mola, MD;  Location: [facility];  Service: Ophthalmology;  Laterality: Left;   CATARACT EXTRACTION W/PHACO Right 06/25/2022    Procedure: CATARACT EXTRACTION PHACO AND INTRAOCULAR LENS PLACEMENT (IOC) RIGHT Kizzie Bane;  Surgeon: Lockie Mola, MD;  Location: [facility];  Service: Ophthalmology;  Laterality: Right;  7.76 01:07.9   COLONOSCOPY  07-09-06   Dr Servando Snare   COLONOSCOPY WITH PROPOFOL N/A 11/12/2016   Procedure: COLONOSCOPY WITH PROPOFOL;  Surgeon: Kieth Brightly, MD;  Location: [facility];  Service: Endoscopy;  Laterality: N/A;   COLONOSCOPY WITH PROPOFOL N/A 04/22/2022   Procedure: COLONOSCOPY WITH PROPOFOL;  Surgeon: Midge Minium, MD;  Location: [facility];  Service: Endoscopy;  Laterality: N/A;   PROSTATE SURGERY  June 2008   SHOULDER SURGERY  Nov 1999, Oct 2015    Home Medications:  Allergies as of 04/29/2023   No Known Allergies      Medication List        Accurate as of April 29, 2023  9:33 AM. If you have any questions, ask your nurse or doctor.          acetaminophen 650 MG CR tablet Commonly known as: TYLENOL Take 1,300 mg by mouth every 8 (eight) hours as needed for pain.   AMBULATORY NON FORMULARY MEDICATION Trimix 150/5/50 -(Pap/Phent/PGE)  Dosage: Inject 0.2cc as directed, may increase by 0.2cc until erections last 30-60 minutes   Vial 1ml  Qty #82ml Refills 3    [employer], [employer].  Phone: [phone] Fax:[phone]   aspirin EC 81 MG tablet Take 162 mg by mouth daily.   atorvastatin 40 MG tablet Commonly known as: LIPITOR TAKE 1 TABLET BY MOUTH EVERY DAY  celecoxib 200 MG capsule Commonly known as: CELEBREX TAKE 1 CAPSULE BY MOUTH EVERY DAY AS NEEDED What changed: See the new instructions.   Combigan 0.2-0.5 % ophthalmic solution Generic drug: brimonidine-timolol Place 1 drop into the right eye every 12 (twelve) hours.   Fish Oil 1200 MG Caps Take 1,200 mg by mouth daily.   Melatonin 10 MG Tabs Take 10 mg by mouth at bedtime as needed (sleep).   MULTIVITAMIN ADULT PO Take 1 tablet by mouth daily.    mupirocin ointment 2 % Commonly known as: BACTROBAN Place 1 application  into the nose daily as needed (immune support).   OVER THE COUNTER MEDICATION Take 3 capsules by mouth daily. Balance in Nature fruits   OVER THE COUNTER MEDICATION Take 3 tablets by mouth daily. Balance of Nature veggies   OVER THE COUNTER MEDICATION Take 1 Scoop by mouth daily. Super Beets   Prednisolon-Moxiflox-Bromfenac 1-0.5-0.075 % Soln Place 1 drop into the right eye See admin instructions. Instill 1 drop in the right eye 4 times daily for 1 week, twice daily for 1 week, then once daily for 1 week then stop   sildenafil 20 MG tablet Commonly known as: REVATIO Take 1 tablet by mouth once daily         Family History: Family History  Problem Relation Age of Onset   Hyperlipidemia Mother    Cancer Mother        unknown type of ca   Diabetes Father    Parkinson's disease Father     Social History:  reports that he has never smoked. He has never used smokeless tobacco. He reports current alcohol use. He reports that he does not use drugs.   Physical Exam: BP 138/70   Pulse 74   Ht 5\' 10"  (1.778 m)   Wt 180 lb (81.6 kg)   BMI 25.83 kg/m   Constitutional:  Alert and oriented, No acute distress. HEENT: [place] AT Respiratory: Normal respiratory effort, no increased work of breathing. Psychiatric: Normal mood and affect.   Assessment & Plan:    1.  History prostate cancer PSA drawn today   2.  Erectile dysfunction Will increase Trimix Super 150/10/100    I have reviewed the above documentation for accuracy and completeness, and I agree with the above.   Riki Altes, MD  [facility] [address] [place], [place] [address] [phone]

## 2023-04-30 ENCOUNTER — Other Ambulatory Visit: Payer: Self-pay | Admitting: Urology

## 2023-04-30 DIAGNOSIS — C61 Malignant neoplasm of prostate: Secondary | ICD-10-CM

## 2023-04-30 LAB — PSA: Prostate Specific Ag, Serum: 0.3 ng/mL (ref 0.0–4.0)

## 2023-05-20 ENCOUNTER — Ambulatory Visit
Admission: RE | Admit: 2023-05-20 | Discharge: 2023-05-20 | Disposition: A | Discharge status: Home / Self Care | Payer: HMO | Source: Ambulatory Visit | Attending: Urology | Admitting: Urology

## 2023-05-20 DIAGNOSIS — Z85828 Personal history of other malignant neoplasm of skin: Secondary | ICD-10-CM | POA: Diagnosis not present

## 2023-05-20 DIAGNOSIS — C61 Malignant neoplasm of prostate: Secondary | ICD-10-CM | POA: Diagnosis not present

## 2023-05-20 DIAGNOSIS — C181 Malignant neoplasm of appendix: Secondary | ICD-10-CM | POA: Insufficient documentation

## 2023-05-20 MED ORDER — FLOTUFOLASTAT F 18 GALLIUM 296-5846 MBQ/ML IV SOLN
8.0000 | Freq: Once | INTRAVENOUS | Status: AC
  Administered 2023-05-20: 8.47 via INTRAVENOUS
  Filled 2023-05-20: qty 8

## 2023-05-22 ENCOUNTER — Telehealth: Payer: Self-pay

## 2023-05-22 ENCOUNTER — Encounter: Payer: Self-pay | Admitting: Urology

## 2023-05-22 NOTE — Telephone Encounter (Signed)
Patient called in and left a message for PET results.

## 2023-05-22 NOTE — Telephone Encounter (Signed)
See mychart message with patient. Results are not finalized yet

## 2023-05-26 ENCOUNTER — Encounter: Payer: Self-pay | Admitting: Urology

## 2023-05-28 ENCOUNTER — Encounter: Payer: Self-pay | Admitting: Urology

## 2023-07-09 ENCOUNTER — Other Ambulatory Visit: Payer: Self-pay | Admitting: Urology

## 2023-07-31 DIAGNOSIS — D2262 Melanocytic nevi of left upper limb, including shoulder: Secondary | ICD-10-CM | POA: Diagnosis not present

## 2023-07-31 DIAGNOSIS — D2272 Melanocytic nevi of left lower limb, including hip: Secondary | ICD-10-CM | POA: Diagnosis not present

## 2023-07-31 DIAGNOSIS — Z85828 Personal history of other malignant neoplasm of skin: Secondary | ICD-10-CM | POA: Diagnosis not present

## 2023-07-31 DIAGNOSIS — D485 Neoplasm of uncertain behavior of skin: Secondary | ICD-10-CM | POA: Diagnosis not present

## 2023-07-31 DIAGNOSIS — D2261 Melanocytic nevi of right upper limb, including shoulder: Secondary | ICD-10-CM | POA: Diagnosis not present

## 2023-07-31 DIAGNOSIS — D225 Melanocytic nevi of trunk: Secondary | ICD-10-CM | POA: Diagnosis not present

## 2023-07-31 DIAGNOSIS — L570 Actinic keratosis: Secondary | ICD-10-CM | POA: Diagnosis not present

## 2023-09-17 ENCOUNTER — Other Ambulatory Visit: Payer: Self-pay | Admitting: Internal Medicine

## 2023-09-23 DIAGNOSIS — L905 Scar conditions and fibrosis of skin: Secondary | ICD-10-CM | POA: Diagnosis not present

## 2023-10-07 ENCOUNTER — Other Ambulatory Visit: Payer: Self-pay | Admitting: Urology

## 2023-10-14 DIAGNOSIS — M1711 Unilateral primary osteoarthritis, right knee: Secondary | ICD-10-CM | POA: Diagnosis not present

## 2023-10-22 ENCOUNTER — Other Ambulatory Visit: Payer: Self-pay | Admitting: Internal Medicine

## 2024-01-06 ENCOUNTER — Other Ambulatory Visit: Payer: Self-pay | Admitting: Urology

## 2024-02-16 ENCOUNTER — Encounter: Payer: Self-pay | Admitting: Urology

## 2024-03-15 DIAGNOSIS — N529 Male erectile dysfunction, unspecified: Secondary | ICD-10-CM | POA: Diagnosis not present

## 2024-03-15 DIAGNOSIS — Z1331 Encounter for screening for depression: Secondary | ICD-10-CM | POA: Diagnosis not present

## 2024-03-15 DIAGNOSIS — Z8546 Personal history of malignant neoplasm of prostate: Secondary | ICD-10-CM | POA: Diagnosis not present

## 2024-03-15 DIAGNOSIS — F4321 Adjustment disorder with depressed mood: Secondary | ICD-10-CM | POA: Diagnosis not present

## 2024-03-15 DIAGNOSIS — C181 Malignant neoplasm of appendix: Secondary | ICD-10-CM | POA: Diagnosis not present

## 2024-04-08 DIAGNOSIS — L570 Actinic keratosis: Secondary | ICD-10-CM | POA: Diagnosis not present

## 2024-04-08 DIAGNOSIS — D225 Melanocytic nevi of trunk: Secondary | ICD-10-CM | POA: Diagnosis not present

## 2024-04-08 DIAGNOSIS — D2262 Melanocytic nevi of left upper limb, including shoulder: Secondary | ICD-10-CM | POA: Diagnosis not present

## 2024-04-08 DIAGNOSIS — D2272 Melanocytic nevi of left lower limb, including hip: Secondary | ICD-10-CM | POA: Diagnosis not present

## 2024-04-08 DIAGNOSIS — Z85828 Personal history of other malignant neoplasm of skin: Secondary | ICD-10-CM | POA: Diagnosis not present

## 2024-04-08 DIAGNOSIS — L720 Epidermal cyst: Secondary | ICD-10-CM | POA: Diagnosis not present

## 2024-04-08 DIAGNOSIS — D2261 Melanocytic nevi of right upper limb, including shoulder: Secondary | ICD-10-CM | POA: Diagnosis not present

## 2024-04-13 ENCOUNTER — Other Ambulatory Visit: Payer: Self-pay | Admitting: Urology

## 2024-04-25 DIAGNOSIS — M25511 Pain in right shoulder: Secondary | ICD-10-CM | POA: Diagnosis not present

## 2024-04-25 DIAGNOSIS — M75121 Complete rotator cuff tear or rupture of right shoulder, not specified as traumatic: Secondary | ICD-10-CM | POA: Diagnosis not present

## 2024-04-25 DIAGNOSIS — M7581 Other shoulder lesions, right shoulder: Secondary | ICD-10-CM | POA: Diagnosis not present

## 2024-04-25 DIAGNOSIS — M1711 Unilateral primary osteoarthritis, right knee: Secondary | ICD-10-CM | POA: Diagnosis not present

## 2024-04-26 ENCOUNTER — Encounter: Payer: Self-pay | Admitting: Urology

## 2024-04-26 ENCOUNTER — Ambulatory Visit: Admitting: Urology

## 2024-04-26 ENCOUNTER — Other Ambulatory Visit: Payer: Self-pay | Admitting: Surgery

## 2024-04-26 VITALS — BP 135/76 | HR 101 | Ht 70.0 in | Wt 169.6 lb

## 2024-04-26 DIAGNOSIS — N529 Male erectile dysfunction, unspecified: Secondary | ICD-10-CM | POA: Diagnosis not present

## 2024-04-26 DIAGNOSIS — C61 Malignant neoplasm of prostate: Secondary | ICD-10-CM

## 2024-04-26 NOTE — Progress Notes (Signed)
 04/26/2024 8:31 AM   Edward Mccoy Edward Mccoy March 02, 1951 [id number]  Referring provider: Bertrum Charlie CROME, MD [address] [place],  [place] [address]  Chief Complaint  Patient presents with   Follow-up   Urologic history: 1. pT2c Gleason 3+4 adenocarcinoma the prostate - RALP 09/2006; PSA remains undetectable   2.  Post prostatectomy erectile dysfunction - Trimix/sildenafil    HPI: Edward Mccoy is a 73 y.o. male presents for annual follow-up.  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   Colon polyp    ED (erectile dysfunction)    Hyperlipidemia    MRSA infection 05/2015   Right Kneee    Surgical History: Past Surgical History:  Procedure Laterality Date   A-FLUTTER ABLATION N/A 07/02/2022   Procedure: A-FLUTTER ABLATION;  Surgeon: [name], MD;  Location: [facility];  Service: Cardiovascular;  Laterality: N/A;   APPENDECTOMY  11/15/14   patient report it was cancerous    CATARACT EXTRACTION W/PHACO Left 05/28/2022   Procedure: CATARACT EXTRACTION PHACO AND INTRAOCULAR LENS PLACEMENT (IOC) LEFT EYHANCE TORIC 8.40 01:20.7;  Surgeon: [name], MD;  Location: [facility];  Service: Ophthalmology;  Laterality: Left;   CATARACT EXTRACTION W/PHACO Right 06/25/2022   Procedure: CATARACT EXTRACTION PHACO AND INTRAOCULAR LENS PLACEMENT (IOC) RIGHT [place];  Surgeon: [name], MD;  Location: [facility];  Service: Ophthalmology;  Laterality: Right;  7.76 01:07.9   COLONOSCOPY  07-09-06   Dr Jinny   COLONOSCOPY WITH PROPOFOL  N/A 11/12/2016    Procedure: COLONOSCOPY WITH PROPOFOL ;  Surgeon: [name], MD;  Location: [facility];  Service: Endoscopy;  Laterality: N/A;   COLONOSCOPY WITH PROPOFOL  N/A 04/22/2022   Procedure: COLONOSCOPY WITH PROPOFOL ;  Surgeon: [name], MD;  Location: [facility];  Service: Endoscopy;  Laterality: N/A;   PROSTATE SURGERY  June 2008   SHOULDER SURGERY  Nov 1999, Oct 2015    Home Medications:  Allergies as of 04/26/2024   No Known Allergies      Medication List        Accurate as of April 26, 2024  8:31 AM. If you have any questions, ask your nurse or doctor.          acetaminophen  650 MG CR tablet Commonly known as: TYLENOL  Take 1,300 mg by mouth every 8 (eight) hours as needed for pain.   AMBULATORY NON FORMULARY MEDICATION Trimix 150/5/50 -(Pap/Phent/PGE)  Dosage: Inject 0.2cc as directed, may increase by 0.2cc until erections last 30-60 minutes   Vial 1ml  Qty #26ml Refills 3    [facility], [employer].  Phone: [phone] Fax:[phone]   aspirin EC 81 MG tablet Take 162 mg by mouth daily.   atorvastatin  40 MG tablet Commonly known as: LIPITOR TAKE 1 TABLET BY MOUTH EVERY DAY   celecoxib  200 MG capsule Commonly known as: CELEBREX  TAKE 1 CAPSULE BY MOUTH EVERY DAY AS NEEDED What changed: See the new instructions.   Combigan  0.2-0.5 % ophthalmic solution Generic drug: brimonidine -timolol  Place 1 drop into the right eye every 12 (twelve) hours.   Fish Oil 1200 MG Caps Take  1,200 mg by mouth daily.   Melatonin 10 MG Tabs Take 10 mg by mouth at bedtime as needed (sleep).   MULTIVITAMIN ADULT PO Take 1 tablet by mouth daily.   mupirocin  ointment 2 % Commonly known as: BACTROBAN  Place 1 application  into the nose daily as needed (immune support).   OVER THE COUNTER MEDICATION Take 3 capsules by mouth daily. Balance in Nature fruits   OVER THE COUNTER MEDICATION Take 3 tablets by mouth daily. Balance of Nature  veggies   OVER THE COUNTER MEDICATION Take 1 Scoop by mouth daily. Super Beets   Prednisolon-Moxiflox-Bromfenac 1-0.5-0.075 % Soln Place 1 drop into the right eye See admin instructions. Instill 1 drop in the right eye 4 times daily for 1 week, twice daily for 1 week, then once daily for 1 week then stop   sildenafil  20 MG tablet Commonly known as: REVATIO  Take 1 tablet by mouth once daily        Allergies: No Known Allergies  Family History: Family History  Problem Relation Age of Onset   Hyperlipidemia Mother    Cancer Mother        unknown type of ca   Diabetes Father    Parkinson's disease Father     Social History:  reports that he has never smoked. He has never used smokeless tobacco. He reports current alcohol use. He reports that he does not use drugs.   Physical Exam: BP 135/76   Pulse (!) 101   Ht 5' 10 (1.778 m)   Wt 169 lb 9.6 oz (76.9 kg)   BMI 24.34 kg/m   Constitutional:  Alert, No acute distress. HEENT: [place] AT Respiratory: Normal respiratory effort, no increased work of breathing. Psychiatric: Normal mood and affect.   Assessment & Plan:    1.  Prostate cancer Detectable PSA; PSMA/PET negative PSA drawn today Continue annual follow-up  2.  Erectile dysfunction Trimix refilled    Glendia Edward Barba, MD  [facility] [address], [address] [place], [place] [address] [phone]

## 2024-04-27 ENCOUNTER — Other Ambulatory Visit: Payer: Self-pay

## 2024-04-27 ENCOUNTER — Encounter
Admission: RE | Admit: 2024-04-27 | Discharge: 2024-04-27 | Disposition: A | Discharge status: Home / Self Care | Source: Ambulatory Visit | Attending: Surgery | Admitting: Surgery

## 2024-04-27 VITALS — BP 134/73 | Resp 18 | Ht 70.0 in | Wt 167.0 lb

## 2024-04-27 DIAGNOSIS — Z8546 Personal history of malignant neoplasm of prostate: Secondary | ICD-10-CM

## 2024-04-27 DIAGNOSIS — I440 Atrioventricular block, first degree: Secondary | ICD-10-CM | POA: Diagnosis not present

## 2024-04-27 DIAGNOSIS — Z01818 Encounter for other preprocedural examination: Secondary | ICD-10-CM | POA: Insufficient documentation

## 2024-04-27 DIAGNOSIS — R7303 Prediabetes: Secondary | ICD-10-CM | POA: Insufficient documentation

## 2024-04-27 DIAGNOSIS — Z01812 Encounter for preprocedural laboratory examination: Secondary | ICD-10-CM

## 2024-04-27 LAB — CBC WITH DIFFERENTIAL/PLATELET
Abs Immature Granulocytes: 0.01 K/uL (ref 0.00–0.07)
Basophils Absolute: 0 K/uL (ref 0.0–0.1)
Basophils Relative: 0 %
Eosinophils Absolute: 0.2 K/uL (ref 0.0–0.5)
Eosinophils Relative: 3 %
HCT: 39.8 % (ref 39.0–52.0)
Hemoglobin: 13.4 g/dL (ref 13.0–17.0)
Immature Granulocytes: 0 %
Lymphocytes Relative: 17 %
Lymphs Abs: 1 K/uL (ref 0.7–4.0)
MCH: 32.4 pg (ref 26.0–34.0)
MCHC: 33.7 g/dL (ref 30.0–36.0)
MCV: 96.4 fL (ref 80.0–100.0)
Monocytes Absolute: 0.6 K/uL (ref 0.1–1.0)
Monocytes Relative: 10 %
Neutro Abs: 3.9 K/uL (ref 1.7–7.7)
Neutrophils Relative %: 70 %
Platelets: 209 K/uL (ref 150–400)
RBC: 4.13 MIL/uL — ABNORMAL LOW (ref 4.22–5.81)
RDW: 12.3 % (ref 11.5–15.5)
WBC: 5.7 K/uL (ref 4.0–10.5)
nRBC: 0 % (ref 0.0–0.2)

## 2024-04-27 LAB — URINALYSIS, ROUTINE W REFLEX MICROSCOPIC
Bacteria, UA: NONE SEEN
Bilirubin Urine: NEGATIVE
Glucose, UA: NEGATIVE mg/dL
Hgb urine dipstick: NEGATIVE
Ketones, ur: 5 mg/dL — AB
Leukocytes,Ua: NEGATIVE
Nitrite: NEGATIVE
Protein, ur: 30 mg/dL — AB
Specific Gravity, Urine: 1.032 — ABNORMAL HIGH (ref 1.005–1.030)
pH: 5 (ref 5.0–8.0)

## 2024-04-27 LAB — COMPREHENSIVE METABOLIC PANEL WITH GFR
ALT: 27 U/L (ref 0–44)
AST: 30 U/L (ref 15–41)
Albumin: 4 g/dL (ref 3.5–5.0)
Alkaline Phosphatase: 31 U/L — ABNORMAL LOW (ref 38–126)
Anion gap: 8 (ref 5–15)
BUN: 15 mg/dL (ref 8–23)
CO2: 28 mmol/L (ref 22–32)
Calcium: 8.9 mg/dL (ref 8.9–10.3)
Chloride: 102 mmol/L (ref 98–111)
Creatinine, Ser: 1.16 mg/dL (ref 0.61–1.24)
GFR, Estimated: >60 mL/min (ref >60)
Glucose, Bld: 87 mg/dL (ref 70–99)
Potassium: 4.4 mmol/L (ref 3.5–5.1)
Sodium: 138 mmol/L (ref 135–145)
Total Bilirubin: 0.8 mg/dL (ref 0.0–1.2)
Total Protein: 6.4 g/dL — ABNORMAL LOW (ref 6.5–8.1)

## 2024-04-27 LAB — PSA: Prostate Specific Ag, Serum: 0.2 ng/mL (ref 0.0–4.0)

## 2024-04-27 LAB — SURGICAL PCR SCREEN
MRSA, PCR: NEGATIVE
Staphylococcus aureus: POSITIVE — AB

## 2024-04-27 NOTE — Patient Instructions (Signed)
 Your procedure is scheduled on: Tuesday 05/03/24 Report to the Registration Desk on the 1st floor of the [facility]. To find out your arrival time, please call [phone] between 1PM - 3PM on: Monday 05/02/24 If your arrival time is 6:00 am, do not arrive before that time as the [facility] entrance doors do not open until 6:00 am.  REMEMBER: Instructions that are not followed completely may result in serious medical risk, up to and including death; or upon the discretion of your surgeon and anesthesiologist your surgery may need to be rescheduled.  Do not eat food after midnight the night before surgery.  No gum chewing or hard candies.  You may however, drink CLEAR liquids up to 2 hours before you are scheduled to arrive for your surgery. Do not drink anything within 2 hours of your scheduled arrival time.  Clear liquids include: - water  - apple juice without pulp - gatorade (not RED colors) - black coffee or tea (Do NOT add milk or creamers to the coffee or tea) Do NOT drink anything that is not on this list.   In addition, your doctor has ordered for you to drink the provided:  Ensure Pre-Surgery Clear Carbohydrate Drink  Drinking this carbohydrate drink up to two hours before surgery helps to reduce insulin resistance and improve patient outcomes. Please complete drinking 2 hours before scheduled arrival time.  One week prior to surgery: Stop Anti-inflammatories (NSAIDS) such as Advil, Aleve, Ibuprofen, Motrin, Naproxen, Naprosyn and Aspirin based products such as Excedrin, Goody's Powder, BC Powder. Stop ANY OVER THE COUNTER supplements until after surgery. Omega-3 Fatty Acids (FISH OIL)  Melatonin  Multiple Vitamins-Minerals  Super Beets  Balance of Nature veggies  Balance in [place] fruits  You may however, continue to take Tylenol  if needed for pain up until the day of surgery.  Stop sildenafil  (REVATIO ) and Trimix 150/5/50 2 days prior to surgery (do not use this  after Saturday 04/30/24)  Continue taking all of your other prescription medications up until the day of surgery.  ON THE DAY OF SURGERY ONLY TAKE THESE MEDICATIONS WITH SIPS OF WATER:  None    No Alcohol for 24 hours before or after surgery.  No Smoking including e-cigarettes for 24 hours before surgery.  No chewable tobacco products for at least 6 hours before surgery.  No nicotine patches on the day of surgery.  Do not use any recreational drugs for at least a week (preferably 2 weeks) before your surgery.  Please be advised that the combination of cocaine and anesthesia may have negative outcomes, up to and including death. If you test positive for cocaine, your surgery will be cancelled.  On the morning of surgery brush your teeth with toothpaste and water, you may rinse your mouth with mouthwash if you wish. Do not swallow any toothpaste or mouthwash.  Use CHG Soap or wipes as directed on instruction sheet.  Do not wear jewelry, make-up, hairpins, clips or nail polish.  For welded (permanent) jewelry: bracelets, anklets, waist bands, etc.  Please have this removed prior to surgery.  If it is not removed, there is a chance that hospital personnel will need to cut it off on the day of surgery.  Do not wear lotions, powders, or perfumes.   Do not shave body hair from the neck down 48 hours before surgery.  Contact lenses, hearing aids and dentures may not be worn into surgery.  Do not bring valuables to the hospital. [facility]  is not responsible for any missing/lost belongings or valuables.   Notify your doctor if there is any change in your medical condition (cold, fever, infection).  Wear comfortable clothing (specific to your surgery type) to the hospital.  After surgery, you can help prevent lung complications by doing breathing exercises.  Take deep breaths and cough every 1-2 hours. Your doctor may order a device called an Incentive Spirometer to help you take  deep breaths. When coughing or sneezing, hold a pillow firmly against your incision with both hands. This is called "splinting." Doing this helps protect your incision. It also decreases belly discomfort.  If you are being admitted to the hospital overnight, leave your suitcase in the car. After surgery it may be brought to your room.  In case of increased patient census, it may be necessary for you, the patient, to continue your postoperative care in the Same Day Surgery department.  If you are being discharged the day of surgery, you will not be allowed to drive home. You will need a responsible individual to drive you home and stay with you for 24 hours after surgery.   If you are taking public transportation, you will need to have a responsible individual with you.  Please call the Pre-admissions Testing Dept. at [phone] if you have any questions about these instructions.  Surgery Visitation Policy:  Patients having surgery or a procedure may have two visitors.  Children under the age of 70 must have an adult with them who is not the patient.  Inpatient Visitation:    Visiting hours are 7 a.m. to 8 p.m. Up to four visitors are allowed at one time in a patient room. The visitors may rotate out with other people during the day.  One visitor age 14 or older may stay with the patient overnight and must be in the room by 8 p.m.   Merchandiser, retail to address health-related social needs:  [email or web address]  Pre-operative 5 CHG Bath Instructions   You can play a key role in reducing the risk of infection after surgery. Your skin needs to be as free of germs as possible. You can reduce the number of germs on your skin by washing with CHG (chlorhexidine gluconate) soap before surgery. CHG is an antiseptic soap that kills germs and continues to kill germs even after washing.   DO NOT use if you have an allergy to chlorhexidine/CHG or antibacterial soaps. If  your skin becomes reddened or irritated, stop using the CHG and notify one of our RNs at [phone].   Please shower with the CHG soap starting 4 days before surgery using the following schedule:     Please keep in mind the following:  DO NOT shave, including legs and underarms, starting the day of your first shower.   You may shave your face at any point before/day of surgery.  Place clean sheets on your bed the day you start using CHG soap. Use a clean washcloth (not used since being washed) for each shower. DO NOT sleep with pets once you start using the CHG.   CHG Shower Instructions:  If you choose to wash your hair and private area, wash first with your normal shampoo/soap.  After you use shampoo/soap, rinse your hair and body thoroughly to remove shampoo/soap residue.  Turn the water OFF and apply about 3 tablespoons (45 ml) of CHG soap to a CLEAN washcloth.  Apply CHG soap ONLY FROM YOUR NECK DOWN TO YOUR TOES (washing for  3-5 minutes)  DO NOT use CHG soap on face, private areas, open wounds, or sores.  Pay special attention to the area where your surgery is being performed.  If you are having back surgery, having someone wash your back for you may be helpful. Wait 2 minutes after CHG soap is applied, then you may rinse off the CHG soap.  Pat dry with a clean towel  Put on clean clothes/pajamas   If you choose to wear lotion, please use ONLY the CHG-compatible lotions on the back of this paper.     Additional instructions for the day of surgery: DO NOT APPLY any lotions, deodorants, cologne, or perfumes.   Put on clean/comfortable clothes.  Brush your teeth.  Ask your nurse before applying any prescription medications to the skin.      CHG Compatible Lotions   Aveeno Moisturizing lotion  Cetaphil Moisturizing Cream  Cetaphil Moisturizing Lotion  Clairol Herbal Essence Moisturizing Lotion, Dry Skin  Clairol Herbal Essence Moisturizing Lotion, Extra Dry Skin   Clairol Herbal Essence Moisturizing Lotion, Normal Skin  Curel Age Defying Therapeutic Moisturizing Lotion with Alpha Hydroxy  Curel Extreme Care Body Lotion  Curel Soothing Hands Moisturizing Hand Lotion  Curel Therapeutic Moisturizing Cream, Fragrance-Free  Curel Therapeutic Moisturizing Lotion, Fragrance-Free  Curel Therapeutic Moisturizing Lotion, Original Formula  Eucerin Daily Replenishing Lotion  Eucerin Dry Skin Therapy Plus Alpha Hydroxy Crme  Eucerin Dry Skin Therapy Plus Alpha Hydroxy Lotion  Eucerin Original Crme  Eucerin Original Lotion  Eucerin Plus Crme Eucerin Plus Lotion  Eucerin TriLipid Replenishing Lotion  Keri Anti-Bacterial Hand Lotion  Keri Deep Conditioning Original Lotion Dry Skin Formula Softly Scented  Keri Deep Conditioning Original Lotion, Fragrance Free Sensitive Skin Formula  Keri Lotion Fast Absorbing Fragrance Free Sensitive Skin Formula  Keri Lotion Fast Absorbing Softly Scented Dry Skin Formula  Keri Original Lotion  Keri Skin Renewal Lotion Keri Silky Smooth Lotion  Keri Silky Smooth Sensitive Skin Lotion  Nivea Body Creamy Conditioning Oil  Nivea Body Extra Enriched Lotion  Nivea Body Original Lotion  Nivea Body Sheer Moisturizing Lotion Nivea Crme  Nivea Skin Firming Lotion  NutraDerm 30 Skin Lotion  NutraDerm Skin Lotion  NutraDerm Therapeutic Skin Cream  NutraDerm Therapeutic Skin Lotion  ProShield Protective Hand Cream  Provon moisturizing lotion  How to Use an Incentive Spirometer  An incentive spirometer is a tool that measures how well you are filling your lungs with each breath. Learning to take long, deep breaths using this tool can help you keep your lungs clear and active. This may help to reverse or lessen your chance of developing breathing (pulmonary) problems, especially infection. You may be asked to use a spirometer: After a surgery. If you have a lung problem or a history of smoking. After a long period of time when you  have been unable to move or be active. If the spirometer includes an indicator to show the highest number that you have reached, your health care provider or respiratory therapist will help you set a goal. Keep a log of your progress as told by your health care provider. What are the risks? Breathing too quickly may cause dizziness or cause you to pass out. Take your time so you do not get dizzy or light-headed. If you are in pain, you may need to take pain medicine before doing incentive spirometry. It is harder to take a deep breath if you are having pain. How to use your incentive spirometer  Sit up on the  edge of your bed or on a chair. Hold the incentive spirometer so that it is in an upright position. Before you use the spirometer, breathe out normally. Place the mouthpiece in your mouth. Make sure your lips are closed tightly around it. Breathe in slowly and as deeply as you can through your mouth, causing the piston or the ball to rise toward the top of the chamber. Hold your breath for 3-5 seconds, or for as long as possible. If the spirometer includes a coach indicator, use this to guide you in breathing. Slow down your breathing if the indicator goes above the marked areas. Remove the mouthpiece from your mouth and breathe out normally. The piston or ball will return to the bottom of the chamber. Rest for a few seconds, then repeat the steps 10 or more times. Take your time and take a few normal breaths between deep breaths so that you do not get dizzy or light-headed. Do this every 1-2 hours when you are awake. If the spirometer includes a goal marker to show the highest number you have reached (best effort), use this as a goal to work toward during each repetition. After each set of 10 deep breaths, cough a few times. This will help to make sure that your lungs are clear. If you have an incision on your chest or abdomen from surgery, place a pillow or a rolled-up towel firmly against  the incision when you cough. This can help to reduce pain while taking deep breaths and coughing. General tips When you are able to get out of bed: Walk around often. Continue to take deep breaths and cough in order to clear your lungs. Keep using the incentive spirometer until your health care provider says it is okay to stop using it. If you have been in the hospital, you may be told to keep using the spirometer at home. Contact a health care provider if: You are having difficulty using the spirometer. You have trouble using the spirometer as often as instructed. Your pain medicine is not giving enough relief for you to use the spirometer as told. You have a fever. Get help right away if: You develop shortness of breath. You develop a cough with bloody mucus from the lungs. You have fluid or blood coming from an incision site after you cough. Summary An incentive spirometer is a tool that can help you learn to take long, deep breaths to keep your lungs clear and active. You may be asked to use a spirometer after a surgery, if you have a lung problem or a history of smoking, or if you have been inactive for a long period of time. Use your incentive spirometer as instructed every 1-2 hours while you are awake. If you have an incision on your chest or abdomen, place a pillow or a rolled-up towel firmly against your incision when you cough. This will help to reduce pain. Get help right away if you have shortness of breath, you cough up bloody mucus, or blood comes from your incision when you cough. This information is not intended to replace advice given to you by your health care provider. Make sure you discuss any questions you have with your health care provider.   Please go to the following website to access important education materials concerning your upcoming joint replacement.  [email or web address]

## 2024-04-28 ENCOUNTER — Ambulatory Visit: Payer: Self-pay | Admitting: Urology

## 2024-04-30 NOTE — Anesthesia Preprocedure Evaluation (Signed)
 Anesthesia Evaluation  Patient identified by MRN, date of birth, ID band Patient awake    Reviewed: Allergy & Precautions, H&P , NPO status , Patient's Chart, lab work & pertinent test results  Airway Mallampati: III  TM Distance: >3 FB Neck ROM: full    Dental no notable dental hx.    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 No date: Colon polyp No date: ED (erectile dysfunction) No date: Hyperlipidemia 05/2015: MRSA infection     Comment:  Right Kneee  Past Surgical History: 07/02/2022: A-FLUTTER ABLATION; N/A     Comment:  Procedure: A-FLUTTER ABLATION;  Surgeon: Waddell Danelle ORN, MD;  Location: [facility];  Service:               Cardiovascular;  Laterality: N/A; 11/15/2014: APPENDECTOMY     Comment:  patient report it was cancerous  05/28/2022: CATARACT EXTRACTION W/PHACO; Left     Comment:  Procedure: CATARACT EXTRACTION PHACO AND INTRAOCULAR               LENS PLACEMENT (IOC) LEFT EYHANCE TORIC 8.40 01:20.7;                Surgeon: [name], MD;  Location: [facility];  Service: Ophthalmology;  Laterality: Left; 06/25/2022: CATARACT EXTRACTION W/PHACO; Right     Comment:  Procedure: CATARACT EXTRACTION PHACO AND INTRAOCULAR                LENS PLACEMENT (IOC) RIGHT COY GREGO;  Surgeon:               [name], MD;  Location: [facility];              Service: Ophthalmology;  Laterality: Right;                7.76 01:07.9 07/09/2006: COLONOSCOPY     Comment:  Dr Jinny 11/12/2016: COLONOSCOPY WITH PROPOFOL ; N/A     Comment:  Procedure: COLONOSCOPY WITH PROPOFOL ;  Surgeon:               [name], MD;  Location: [facility] ENDOS[facility];                Service: Endoscopy;  Laterality: N/A; 04/22/2022: COLONOSCOPY WITH PROPOFOL ; N/A     Comment:  Procedure: COLONOSCOPY WITH PROPOFOL ;  Surgeon: [name], MD;  Location: [facility] ENDOSCOPY;  Service:               Endoscopy;  Laterality: N/A; No date: CORONARY ANGIOPLASTY 01/03/2007: PROSTATE SURGERY Nov 1999, Oct 2015: SHOULDER SURGERY  Reproductive/Obstetrics negative OB ROS                              Anesthesia Physical Anesthesia Plan  ASA: 2  Anesthesia Plan: Spinal   Post-op Pain Management: Regional block*   Induction: Intravenous  PONV Risk Score and Plan: Propofol  infusion, Dexamethasone and Ondansetron   Airway Management Planned: Natural Airway  Additional Equipment:   Intra-op Plan:   Post-operative Plan:   Informed Consent: I have reviewed the patients History and Physical, chart, labs and discussed the procedure including the risks, benefits and alternatives for the proposed anesthesia with the patient or authorized representative who has indicated his/her understanding and acceptance.     Dental Advisory Given  Plan Discussed with: CRNA and Surgeon  Anesthesia Plan Comments:          Anesthesia Quick Evaluation

## 2024-05-03 ENCOUNTER — Ambulatory Visit: Payer: Self-pay | Admitting: Urgent Care

## 2024-05-03 ENCOUNTER — Ambulatory Visit

## 2024-05-03 ENCOUNTER — Encounter: Payer: Self-pay | Admitting: Surgery

## 2024-05-03 ENCOUNTER — Other Ambulatory Visit: Payer: Self-pay

## 2024-05-03 ENCOUNTER — Encounter
Admission: RE | Disposition: A | Discharge status: Home / Self Care | Payer: Self-pay | Source: Home / Self Care | Attending: Surgery

## 2024-05-03 ENCOUNTER — Observation Stay
Admission: RE | Admit: 2024-05-03 | Discharge: 2024-05-04 | Disposition: A | Discharge status: Home / Self Care | Attending: Surgery | Admitting: Surgery

## 2024-05-03 DIAGNOSIS — Z7901 Long term (current) use of anticoagulants: Secondary | ICD-10-CM | POA: Insufficient documentation

## 2024-05-03 DIAGNOSIS — Z8546 Personal history of malignant neoplasm of prostate: Secondary | ICD-10-CM | POA: Insufficient documentation

## 2024-05-03 DIAGNOSIS — Z7982 Long term (current) use of aspirin: Secondary | ICD-10-CM | POA: Insufficient documentation

## 2024-05-03 DIAGNOSIS — Z96651 Presence of right artificial knee joint: Principal | ICD-10-CM

## 2024-05-03 DIAGNOSIS — Z471 Aftercare following joint replacement surgery: Secondary | ICD-10-CM | POA: Diagnosis not present

## 2024-05-03 DIAGNOSIS — Z85038 Personal history of other malignant neoplasm of large intestine: Secondary | ICD-10-CM | POA: Insufficient documentation

## 2024-05-03 DIAGNOSIS — Z79899 Other long term (current) drug therapy: Secondary | ICD-10-CM | POA: Diagnosis not present

## 2024-05-03 DIAGNOSIS — M1711 Unilateral primary osteoarthritis, right knee: Secondary | ICD-10-CM | POA: Diagnosis not present

## 2024-05-03 DIAGNOSIS — R609 Edema, unspecified: Secondary | ICD-10-CM | POA: Diagnosis not present

## 2024-05-03 HISTORY — PX: TOTAL KNEE ARTHROPLASTY: SHX125

## 2024-05-03 SURGERY — ARTHROPLASTY, KNEE, TOTAL
Anesthesia: Spinal | Site: Knee | Laterality: Right

## 2024-05-03 MED ORDER — LIDOCAINE HCL (PF) 2 % IJ SOLN
INTRAMUSCULAR | Status: AC
  Filled 2024-05-03: qty 5

## 2024-05-03 MED ORDER — BUPIVACAINE HCL (PF) 0.5 % IJ SOLN
INTRAMUSCULAR | Status: AC
  Filled 2024-05-03: qty 10

## 2024-05-03 MED ORDER — APIXABAN 2.5 MG PO TABS
2.5000 mg | ORAL_TABLET | Freq: Two times a day (BID) | ORAL | Status: DC
  Administered 2024-05-04: 2.5 mg via ORAL
  Filled 2024-05-03: qty 1

## 2024-05-03 MED ORDER — FENTANYL CITRATE (PF) 100 MCG/2ML IJ SOLN
INTRAMUSCULAR | Status: AC
  Filled 2024-05-03: qty 2

## 2024-05-03 MED ORDER — PROPOFOL 1000 MG/100ML IV EMUL
INTRAVENOUS | Status: AC
  Filled 2024-05-03: qty 100

## 2024-05-03 MED ORDER — FENTANYL CITRATE (PF) 100 MCG/2ML IJ SOLN
INTRAMUSCULAR | Status: DC | PRN
  Administered 2024-05-03: 25 ug via INTRAVENOUS
  Administered 2024-05-03: 50 ug via INTRAVENOUS
  Administered 2024-05-03: 25 ug via INTRAVENOUS

## 2024-05-03 MED ORDER — OXYCODONE HCL 5 MG PO TABS: 5.0000 mg | ORAL_TABLET | ORAL | 0 refills | Status: AC | PRN

## 2024-05-03 MED ORDER — KETOROLAC TROMETHAMINE 15 MG/ML IJ SOLN
7.5000 mg | Freq: Four times a day (QID) | INTRAMUSCULAR | Status: DC
  Administered 2024-05-03 – 2024-05-04 (×3): 7.5 mg via INTRAVENOUS
  Filled 2024-05-03 (×3): qty 1

## 2024-05-03 MED ORDER — PROPOFOL 10 MG/ML IV BOLUS
INTRAVENOUS | Status: DC | PRN
  Administered 2024-05-03: 50 mg via INTRAVENOUS

## 2024-05-03 MED ORDER — CHLORHEXIDINE GLUCONATE 0.12 % MT SOLN
OROMUCOSAL | Status: AC
  Filled 2024-05-03: qty 15

## 2024-05-03 MED ORDER — ACETAMINOPHEN 10 MG/ML IV SOLN: 1000.0000 mg | Freq: Once | INTRAVENOUS | Status: DC | PRN

## 2024-05-03 MED ORDER — ACETAMINOPHEN 10 MG/ML IV SOLN
INTRAVENOUS | Status: DC | PRN
  Administered 2024-05-03: 1000 mg via INTRAVENOUS

## 2024-05-03 MED ORDER — CHLORHEXIDINE GLUCONATE 4 % EX SOLN: 1.0000 | CUTANEOUS | 1 refills | Status: AC

## 2024-05-03 MED ORDER — ORAL CARE MOUTH RINSE: 15.0000 mL | Freq: Once | OROMUCOSAL | Status: AC

## 2024-05-03 MED ORDER — ACETAMINOPHEN 325 MG PO TABS: 325.0000 mg | ORAL_TABLET | Freq: Four times a day (QID) | ORAL | Status: DC | PRN

## 2024-05-03 MED ORDER — DROPERIDOL 2.5 MG/ML IJ SOLN: 0.6250 mg | Freq: Once | INTRAMUSCULAR | Status: DC | PRN

## 2024-05-03 MED ORDER — DIPHENHYDRAMINE HCL 12.5 MG/5ML PO ELIX: 12.5000 mg | ORAL_SOLUTION | ORAL | Status: DC | PRN

## 2024-05-03 MED ORDER — BUPIVACAINE HCL (PF) 0.5 % IJ SOLN
INTRAMUSCULAR | Status: DC | PRN
  Administered 2024-05-03: 2.5 mL

## 2024-05-03 MED ORDER — BUPIVACAINE-EPINEPHRINE (PF) 0.5% -1:200000 IJ SOLN
INTRAMUSCULAR | Status: DC | PRN
  Administered 2024-05-03: 30 mL

## 2024-05-03 MED ORDER — MIDAZOLAM HCL 2 MG/2ML IJ SOLN
INTRAMUSCULAR | Status: AC
  Filled 2024-05-03: qty 2

## 2024-05-03 MED ORDER — CEFAZOLIN SODIUM-DEXTROSE 2-4 GM/100ML-% IV SOLN
INTRAVENOUS | Status: AC
  Filled 2024-05-03: qty 100

## 2024-05-03 MED ORDER — PROPOFOL 500 MG/50ML IV EMUL
INTRAVENOUS | Status: DC | PRN
  Administered 2024-05-03: 75 ug/kg/min via INTRAVENOUS

## 2024-05-03 MED ORDER — BISACODYL 10 MG RE SUPP: 10.0000 mg | Freq: Every day | RECTAL | Status: DC | PRN

## 2024-05-03 MED ORDER — METOCLOPRAMIDE HCL 10 MG PO TABS: 5.0000 mg | ORAL_TABLET | Freq: Three times a day (TID) | ORAL | Status: DC | PRN

## 2024-05-03 MED ORDER — TRIAMCINOLONE ACETONIDE 40 MG/ML IJ SUSP
INTRAMUSCULAR | Status: DC | PRN
  Administered 2024-05-03: 80 mg via INTRAMUSCULAR

## 2024-05-03 MED ORDER — TRANEXAMIC ACID-NACL 1000-0.7 MG/100ML-% IV SOLN
INTRAVENOUS | Status: AC
  Filled 2024-05-03: qty 100

## 2024-05-03 MED ORDER — ONDANSETRON HCL 4 MG/2ML IJ SOLN: 4.0000 mg | Freq: Four times a day (QID) | INTRAMUSCULAR | Status: DC | PRN

## 2024-05-03 MED ORDER — BUPIVACAINE-EPINEPHRINE (PF) 0.5% -1:200000 IJ SOLN
INTRAMUSCULAR | Status: AC
  Filled 2024-05-03: qty 30

## 2024-05-03 MED ORDER — ONDANSETRON HCL 4 MG PO TABS: 4.0000 mg | ORAL_TABLET | Freq: Four times a day (QID) | ORAL | Status: DC | PRN

## 2024-05-03 MED ORDER — MUPIROCIN 2 % EX OINT: 1.0000 | TOPICAL_OINTMENT | Freq: Two times a day (BID) | CUTANEOUS | 0 refills | Status: AC

## 2024-05-03 MED ORDER — SODIUM CHLORIDE 0.9 % IR SOLN
Status: DC | PRN
  Administered 2024-05-03: 3000 mL

## 2024-05-03 MED ORDER — MUPIROCIN 2 % EX OINT: 1.0000 | TOPICAL_OINTMENT | Freq: Every day | CUTANEOUS | Status: DC | PRN

## 2024-05-03 MED ORDER — CEFAZOLIN SODIUM-DEXTROSE 2-4 GM/100ML-% IV SOLN
2.0000 g | Freq: Four times a day (QID) | INTRAVENOUS | Status: AC
  Administered 2024-05-03: 2 g via INTRAVENOUS
  Filled 2024-05-03: qty 100

## 2024-05-03 MED ORDER — SODIUM CHLORIDE 0.9 % BOLUS PEDS
250.0000 mL | Freq: Once | INTRAVENOUS | Status: AC
  Administered 2024-05-03: 250 mL via INTRAVENOUS

## 2024-05-03 MED ORDER — METOCLOPRAMIDE HCL 5 MG/ML IJ SOLN: 5.0000 mg | Freq: Three times a day (TID) | INTRAMUSCULAR | Status: DC | PRN

## 2024-05-03 MED ORDER — MAGNESIUM HYDROXIDE 400 MG/5ML PO SUSP: 30.0000 mL | Freq: Every day | ORAL | Status: DC | PRN

## 2024-05-03 MED ORDER — KETOROLAC TROMETHAMINE 15 MG/ML IJ SOLN
15.0000 mg | Freq: Once | INTRAMUSCULAR | Status: AC
  Administered 2024-05-03: 15 mg via INTRAVENOUS

## 2024-05-03 MED ORDER — OXYCODONE HCL 5 MG/5ML PO SOLN: 5.0000 mg | Freq: Once | ORAL | Status: AC | PRN

## 2024-05-03 MED ORDER — SODIUM CHLORIDE (PF) 0.9 % IJ SOLN
INTRAMUSCULAR | Status: AC
  Filled 2024-05-03: qty 50

## 2024-05-03 MED ORDER — ONDANSETRON HCL 4 MG/2ML IJ SOLN
INTRAMUSCULAR | Status: AC
  Filled 2024-05-03: qty 2

## 2024-05-03 MED ORDER — DOCUSATE SODIUM 100 MG PO CAPS
ORAL_CAPSULE | ORAL | Status: AC
  Filled 2024-05-03: qty 1

## 2024-05-03 MED ORDER — ACETAMINOPHEN 500 MG PO TABS
1000.0000 mg | ORAL_TABLET | Freq: Four times a day (QID) | ORAL | Status: DC
  Administered 2024-05-03 – 2024-05-04 (×3): 1000 mg via ORAL
  Filled 2024-05-03 (×3): qty 2

## 2024-05-03 MED ORDER — ACETAMINOPHEN 500 MG PO TABS: 1000.0000 mg | ORAL_TABLET | Freq: Four times a day (QID) | ORAL | Status: DC

## 2024-05-03 MED ORDER — SODIUM CHLORIDE 0.9 % IV SOLN: INTRAVENOUS | Status: DC

## 2024-05-03 MED ORDER — TRIAMCINOLONE ACETONIDE 40 MG/ML IJ SUSP
INTRAMUSCULAR | Status: AC
  Filled 2024-05-03: qty 2

## 2024-05-03 MED ORDER — LIDOCAINE HCL (CARDIAC) PF 100 MG/5ML IV SOSY
PREFILLED_SYRINGE | INTRAVENOUS | Status: DC | PRN
  Administered 2024-05-03: 3 mL via INTRAVENOUS
  Administered 2024-05-03: 40 mg via INTRAVENOUS

## 2024-05-03 MED ORDER — KETOROLAC TROMETHAMINE 15 MG/ML IJ SOLN
INTRAMUSCULAR | Status: AC
  Filled 2024-05-03: qty 1

## 2024-05-03 MED ORDER — DOCUSATE SODIUM 100 MG PO CAPS
100.0000 mg | ORAL_CAPSULE | Freq: Two times a day (BID) | ORAL | Status: DC
  Administered 2024-05-03 – 2024-05-04 (×2): 100 mg via ORAL
  Filled 2024-05-03: qty 1

## 2024-05-03 MED ORDER — ATORVASTATIN CALCIUM 10 MG PO TABS
40.0000 mg | ORAL_TABLET | Freq: Every day | ORAL | Status: DC
  Administered 2024-05-03: 40 mg via ORAL
  Filled 2024-05-03 (×2): qty 4

## 2024-05-03 MED ORDER — FENTANYL CITRATE (PF) 100 MCG/2ML IJ SOLN: 25.0000 ug | INTRAMUSCULAR | Status: DC | PRN

## 2024-05-03 MED ORDER — MIDAZOLAM HCL 5 MG/5ML IJ SOLN
INTRAMUSCULAR | Status: DC | PRN
  Administered 2024-05-03: 2 mg via INTRAVENOUS

## 2024-05-03 MED ORDER — ACETAMINOPHEN 10 MG/ML IV SOLN
INTRAVENOUS | Status: AC
  Filled 2024-05-03: qty 100

## 2024-05-03 MED ORDER — SODIUM CHLORIDE 0.9 % IV SOLN
INTRAVENOUS | Status: DC | PRN
  Administered 2024-05-03: 60 mL

## 2024-05-03 MED ORDER — KETOROLAC TROMETHAMINE 30 MG/ML IJ SOLN
INTRAMUSCULAR | Status: DC | PRN
  Administered 2024-05-03: 30 mg via INTRAMUSCULAR

## 2024-05-03 MED ORDER — CHLORHEXIDINE GLUCONATE 0.12 % MT SOLN
15.0000 mL | Freq: Once | OROMUCOSAL | Status: AC
  Administered 2024-05-03: 15 mL via OROMUCOSAL

## 2024-05-03 MED ORDER — PHENYLEPHRINE HCL-NACL 20-0.9 MG/250ML-% IV SOLN
INTRAVENOUS | Status: AC
  Filled 2024-05-03: qty 250

## 2024-05-03 MED ORDER — FLEET ENEMA RE ENEM: 1.0000 | ENEMA | Freq: Once | RECTAL | Status: DC | PRN

## 2024-05-03 MED ORDER — CEFAZOLIN SODIUM-DEXTROSE 2-4 GM/100ML-% IV SOLN
2.0000 g | INTRAVENOUS | Status: AC
  Administered 2024-05-03: 2 g via INTRAVENOUS

## 2024-05-03 MED ORDER — SILDENAFIL CITRATE 20 MG PO TABS
20.0000 mg | ORAL_TABLET | Freq: Every day | ORAL | Status: DC
  Administered 2024-05-04: 20 mg via ORAL
  Filled 2024-05-03: qty 1

## 2024-05-03 MED ORDER — KETOROLAC TROMETHAMINE 30 MG/ML IJ SOLN
INTRAMUSCULAR | Status: AC
  Filled 2024-05-03: qty 1

## 2024-05-03 MED ORDER — LACTATED RINGERS IV SOLN
INTRAVENOUS | Status: DC
Start: 2024-05-03 — End: 2024-05-03

## 2024-05-03 MED ORDER — MELATONIN 5 MG PO TABS: 10.0000 mg | ORAL_TABLET | Freq: Every evening | ORAL | Status: DC | PRN

## 2024-05-03 MED ORDER — ADULT MULTIVITAMIN W/MINERALS CH
1.0000 | ORAL_TABLET | Freq: Every day | ORAL | Status: DC
  Administered 2024-05-04: 1 via ORAL
  Filled 2024-05-03: qty 1

## 2024-05-03 MED ORDER — OXYCODONE HCL 5 MG PO TABS
5.0000 mg | ORAL_TABLET | Freq: Once | ORAL | Status: AC | PRN
  Administered 2024-05-03: 5 mg via ORAL

## 2024-05-03 MED ORDER — TRANEXAMIC ACID-NACL 1000-0.7 MG/100ML-% IV SOLN
1000.0000 mg | INTRAVENOUS | Status: AC
  Administered 2024-05-03: 1000 mg via INTRAVENOUS

## 2024-05-03 MED ORDER — OXYCODONE HCL 5 MG PO TABS
ORAL_TABLET | ORAL | Status: AC
  Filled 2024-05-03: qty 1

## 2024-05-03 MED ORDER — APIXABAN 2.5 MG PO TABS: 2.5000 mg | ORAL_TABLET | Freq: Two times a day (BID) | ORAL | 0 refills | Status: AC

## 2024-05-03 MED ORDER — BUPIVACAINE LIPOSOME 1.3 % IJ SUSP
INTRAMUSCULAR | Status: AC
  Filled 2024-05-03: qty 20

## 2024-05-03 MED ORDER — OXYCODONE HCL 5 MG PO TABS
5.0000 mg | ORAL_TABLET | ORAL | Status: DC | PRN
  Administered 2024-05-03: 5 mg via ORAL
  Filled 2024-05-03: qty 1

## 2024-05-03 SURGICAL SUPPLY — 52 items
BLADE SAW 90X13X1.19 OSCILLAT (BLADE) ×1 IMPLANT
BLADE SAW SAG 25X90X1.19 (BLADE) ×1 IMPLANT
BLADE SURG SZ20 CARB STEEL (BLADE) ×1 IMPLANT
BNDG COMPR 6X5.8 VLCR NS LF (GAUZE/BANDAGES/DRESSINGS) ×1 IMPLANT
CEMENT VACUUM MIXING SYSTEM (MISCELLANEOUS) ×1 IMPLANT
CHLORAPREP W/TINT 26 (MISCELLANEOUS) ×1 IMPLANT
COMPONENT PATELLA PEG 3 32 (Joint) IMPLANT
COOLER ICEMAN CLASSIC (MISCELLANEOUS) ×1 IMPLANT
COVER MAYO STAND STRL (DRAPES) ×1 IMPLANT
CUFF TRNQT CYL 24X4X16.5-23 (TOURNIQUET CUFF) IMPLANT
CUFF TRNQT CYL 34X4.125X (TOURNIQUET CUFF) IMPLANT
DRAPE IMP U-DRAPE 54X76 (DRAPES) ×1 IMPLANT
DRAPE SHEET LG 3/4 BI-LAMINATE (DRAPES) ×1 IMPLANT
DRAPE U-SHAPE 47X51 STRL (DRAPES) ×1 IMPLANT
DRSG MEPILEX SACRM 8.7X9.8 (GAUZE/BANDAGES/DRESSINGS) IMPLANT
DRSG OPSITE POSTOP 4X10 (GAUZE/BANDAGES/DRESSINGS) ×1 IMPLANT
DRSG OPSITE POSTOP 4X8 (GAUZE/BANDAGES/DRESSINGS) ×1 IMPLANT
ELECT CAUTERY BLADE 6.4 (BLADE) ×1 IMPLANT
ELECTRODE REM PT RTRN 9FT ADLT (ELECTROSURGICAL) ×1 IMPLANT
GAUZE XEROFORM 1X8 LF (GAUZE/BANDAGES/DRESSINGS) ×1 IMPLANT
GLOVE BIO SURGEON STRL SZ7.5 (GLOVE) ×4 IMPLANT
GLOVE BIO SURGEON STRL SZ8 (GLOVE) ×4 IMPLANT
GLOVE BIOGEL PI IND STRL 8 (GLOVE) ×1 IMPLANT
GLOVE INDICATOR 8.0 STRL GRN (GLOVE) ×1 IMPLANT
GOWN STRL REUS W/ TWL LRG LVL3 (GOWN DISPOSABLE) ×1 IMPLANT
GOWN STRL REUS W/ TWL XL LVL3 (GOWN DISPOSABLE) ×1 IMPLANT
HOOD PEEL AWAY T7 (MISCELLANEOUS) ×3 IMPLANT
INSERT TIBIA KNEE RIGHT 10 (Joint) IMPLANT
KIT TURNOVER KIT A (KITS) ×1 IMPLANT
MANIFOLD NEPTUNE II (INSTRUMENTS) ×1 IMPLANT
NDL SPNL 20GX3.5 QUINCKE YW (NEEDLE) ×1 IMPLANT
NEEDLE SPNL 20GX3.5 QUINCKE YW (NEEDLE) ×1 IMPLANT
NS IRRIG 500ML POUR BTL (IV SOLUTION) ×1 IMPLANT
PACK TOTAL KNEE (MISCELLANEOUS) ×1 IMPLANT
PAD COLD UNI WRAP-ON (PAD) ×1 IMPLANT
PENCIL SMOKE EVACUATOR (MISCELLANEOUS) ×1 IMPLANT
PIN DRILL HDLS TROCAR 75 4PK (PIN) IMPLANT
PROSTHESIS FEM KNEE PS STD9 RT (Joint) IMPLANT
PROSTHESIS TIB KNEE PS 0D F RT (Joint) IMPLANT
SCREW FEMALE HEX FIX 25X2.5 (ORTHOPEDIC DISPOSABLE SUPPLIES) IMPLANT
SOL .9 NS 3000ML IRR UROMATIC (IV SOLUTION) ×1 IMPLANT
STAPLER SKIN PROX 35W (STAPLE) ×1 IMPLANT
STOCKINETTE IMPERV 14X48 (MISCELLANEOUS) ×1 IMPLANT
SUCTION TUBE FRAZIER 10FR DISP (SUCTIONS) ×1 IMPLANT
SUT VIC AB 0 CT1 36 (SUTURE) ×3 IMPLANT
SUT VIC AB 2-0 CT1 TAPERPNT 27 (SUTURE) ×3 IMPLANT
SYR 10ML LL (SYRINGE) ×1 IMPLANT
SYR 20ML LL LF (SYRINGE) ×1 IMPLANT
SYR 30ML LL (SYRINGE) IMPLANT
TIP FAN IRRIG PULSAVAC PLUS (DISPOSABLE) ×1 IMPLANT
TRAP FLUID SMOKE EVACUATOR (MISCELLANEOUS) ×1 IMPLANT
WATER STERILE IRR 500ML POUR (IV SOLUTION) ×1 IMPLANT

## 2024-05-03 NOTE — Transfer of Care (Signed)
 Immediate Anesthesia Transfer of Care Note  Patient: Edward Mccoy  Procedure(s) Performed: ARTHROPLASTY, KNEE, TOTAL (Right: Knee)  Patient Location: PACU  Anesthesia Type:Spinal  Level of Consciousness: awake  Airway & Oxygen Therapy: Patient Spontanous Breathing  Post-op Assessment: Report given to RN  Post vital signs: Reviewed and stable  Last Vitals:  Vitals Value Taken Time  BP 106/78 05/03/24 13:05  Temp    Pulse 79 05/03/24 13:10  Resp 11 05/03/24 13:10  SpO2 98 % 05/03/24 13:10  Vitals shown include unfiled device data.  Last Pain:  Vitals:   05/03/24 0932  TempSrc: Temporal  PainSc: 0-No pain         Complications: No notable events documented.

## 2024-05-03 NOTE — Op Note (Signed)
 05/03/2024  12:49 PM  Patient:   Edward Mccoy  Pre-Op Diagnosis:   Degenerative joint disease, right knee.  Post-Op Diagnosis:   Same  Procedure:   Right TKA using all-pressfit Zimmer Persona system with a #9 PCR femur, a(n) F-sized  tibial tray with a 10 mm medial congruent E-poly insert, and a 9 x 32 mm all-poly 3-pegged domed patella.  Surgeon:   DOROTHA Reyes Maltos, MD  Assistant:   Gustavo Level, PA-C; Dozier Cuba, PA-S  Anesthesia:   Spinal  Findings:   As above  Complications:   None  EBL:   5 cc  Fluids:   400 cc crystalloid  UOP:   None  TT:   80 minutes at 300 mmHg  Drains:   None  Closure:   Staples  Implants:   As above  Brief Clinical Note:   The patient is a 73 year old male with a long history of progressively worsening right knee pain. The patient's symptoms have progressed despite medications, activity modification, injections, etc. The patient's history and examination were consistent with advanced degenerative joint disease of the right knee confirmed by plain radiographs. The patient presents at this time for a right total knee arthroplasty.  Procedure:   The patient was brought into the operating room. After adequate spinal anesthesia was obtained, the patient was repositioned in the supine position on the operating room table. The right lower extremity was prepped with ChloraPrep solution and draped sterilely. Preoperative antibiotics were administered. A timeout was performed to verify the appropriate surgical site before the limb was exsanguinated with an Esmarch and the tourniquet inflated to 300 mmHg.   A standard anterior approach to the knee was made through an approximately 6-7 inch incision. The incision was carried down through the subcutaneous tissues to expose superficial retinaculum. This was split the length of the incision and the medial flap elevated sufficiently to expose the medial retinaculum. The medial retinaculum was incised,  leaving a 3-4 mm cuff of tissue on the patella. This was extended distally along the medial border of the patellar tendon and proximally through the medial third of the quadriceps tendon. A subtotal fat pad excision was performed before the soft tissues were elevated off the anteromedial and anterolateral aspects of the proximal tibia to the level of the collateral ligaments. The anterior portions of the medial and lateral menisci were removed, as was the anterior cruciate ligament. With the knee flexed to 90, the external tibial guide was positioned and the appropriate proximal tibial cut made. This piece was taken to the back table where it was measured and found to be optimally replicated by a(n) F-sized component.  Attention was directed to the distal femur. The intramedullary canal was accessed through a 3/8 drill hole. The intramedullary guide was inserted and placed at 5 of valgus alignment. Using the +0 slot, the distal cut was made. The distal femur was measured and found to be optimally replicated by the #9 component. The #9 4-in-1 cutting block was positioned and first the posterior, then the posterior chamfer, the anterior, and finally the anterior chamfer cuts were made after verifying that the anterior cortex would not be notched.   At this point, the posterior portions medial and lateral menisci were removed. A trial reduction was performed using the appropriate femoral and tibial components with the 10 mm insert. This demonstrated excellent stability to varus and valgus stressing both in flexion and extension while permitting full extension. Patellar tracking was assessed and found to  be excellent. The tibial trial position was marked on the proximal tibia. The patella thickness was measured and found to be 24 mm, so the appropriate cut was made. The patellar surface was measured and found to be optimally replicated by the 32 mm component. The three peg holes were drilled in place before the  trial button was inserted. Patella tracking was assessed and found to be excellent, passing the no thumb test. The lug holes were drilled into the distal femur before the trial component was removed.  The tibial tray was repositioned before the keel was created using the appropriate tower, drills, and punch.  The bony surfaces were prepared for implantation by irrigating them thoroughly with sterile saline solution via the jet lavage system. A bone plug was fashioned from some of the bone that had been removed previously and used to plug the distal femoral canal. In addition, a cocktail of 20 cc of Exparel , 30 cc of 0.5% Sensorcaine , 2 cc of Kenalog 40 (80 mg), and 30 mg of Toradol diluted out to 90 cc with normal saline was injected into the postero-medial and postero-lateral aspects of the knee, the medial and lateral gutter regions, and the peri-incisional tissues to help with postoperative analgesia.    The tibial tray was impacted into place first with care taken to be sure the component was fully seated. Next, the femoral component was impacted into place again with care taken to be sure that the component was fully improperly seated. The permanent 10 mm medial congruent E-polyethylene insert was snapped into place with care taken to ensure appropriate locking of the insert. Finally, the patella was positioned and compressed into place using the patellar clamp. Again, care was taken to be sure that the component was fully seated. The knee was placed through a range of motion with the findings as described above.    The wound was copiously irrigated with sterile saline solution using the jet lavage system before the quadriceps tendon and retinacular layer were reapproximated using #0 Vicryl interrupted sutures. The superficial retinacular layer also was closed using a running #0 Vicryl suture. The subcutaneous tissues were closed in several layers using 2-0 Vicryl interrupted sutures. The skin was  closed using staples. A sterile honeycomb dressing was applied to the skin before the leg was wrapped with an Ace wrap to accommodate the Polar Care device. The patient was then awakened and returned to the recovery room in satisfactory condition after tolerating the procedure well.

## 2024-05-03 NOTE — Anesthesia Procedure Notes (Signed)
 Spinal  Patient location during procedure: OR Start time: 05/03/2024 10:55 AM End time: 05/03/2024 10:59 AM Reason for block: surgical anesthesia Staffing Performed: resident/CRNA  Anesthesiologist: Vicci Camellia Glatter, MD Resident/CRNA: Veronica Alm BROCKS, CRNA Other anesthesia staff: Shella Eth, RN Performed by: Veronica Alm BROCKS, CRNA Authorized by: Vicci Camellia Glatter, MD   Preanesthetic Checklist Completed: patient identified, IV checked, site marked, risks and benefits discussed, surgical consent, monitors and equipment checked, pre-op evaluation and timeout performed Spinal Block Patient position: sitting Prep: ChloraPrep Patient monitoring: heart rate, cardiac monitor, continuous pulse ox and blood pressure Approach: midline Location: L3-4 Injection technique: single-shot Needle Needle type: Pencan  Needle gauge: 25 G Needle length: 10 cm Assessment Sensory level: T10 Events: CSF return Additional Notes IV functioning, monitors applied to pt. Expiration date of kit checked and confirmed to be in date. Sterile prep and drape, hand hygiene and sterile gloved used. Pt was positioned and spine was prepped in sterile fashion. Skin was anesthetized with lidocaine . Free flow of clear CSF obtained prior to injecting local anesthetic into CSF x 1 attempt. Spinal needle aspirated freely following injection. Needle was carefully withdrawn, and pt tolerated procedure well. Loss of motor and sensory on exam post injection.

## 2024-05-03 NOTE — Discharge Instructions (Addendum)
 Orthopedic discharge instructions: May shower with intact OpSite dressing. Apply ice frequently to knee or use Polar Care. Start Eliquis 1 tablet (2.5 mg) twice daily on Wednesday, 05/04/2024, for 2 weeks, then take aspirin 325 mg twice daily for 4 weeks. Take pain medication as prescribed when needed.  May supplement with ES Tylenol  if necessary. May weight-bear as tolerated on right leg - use walker for balance and support. Follow-up in 10-14 days or as scheduled.  POLAR CARE INFORMATION  MassAdvertisement.it  How to use [employer] Polar Care [facility] Therapy System?  YouTube   ShippingScam.co.uk  OPERATING INSTRUCTIONS  Start the product With dry hands, connect the transformer to the electrical connection located on the top of the cooler. Next, plug the transformer into an appropriate electrical outlet. The unit will automatically start running at this point.  To stop the pump, disconnect electrical power.  Unplug to stop the product when not in use. Unplugging the Polar Care unit turns it off. Always unplug immediately after use. Never leave it plugged in while unattended. Remove pad.    FIRST ADD WATER TO FILL LINE, THEN ICE---Replace ice when existing ice is almost melted  1 Discuss Treatment with your Licensed Health Care Practitioner and Use Only as Prescribed 2 Apply Insulation Barrier & Cold Therapy Pad 3 Check for Moisture 4 Inspect Skin Regularly  Tips and Trouble Shooting Usage Tips 1. Use cubed or chunked ice for optimal performance. 2. It is recommended to drain the Pad between uses. To drain the pad, hold the Pad upright with the hose pointed toward the ground. Depress the black plunger and allow water to drain out. 3. You may disconnect the Pad from the unit without removing the pad from the affected area by depressing the silver tabs on the hose coupling and gently pulling the hoses apart. The Pad and unit will seal itself and will not leak. Note:  Some dripping during release is normal. 4. DO NOT RUN PUMP WITHOUT WATER! The pump in this unit is designed to run with water. Running the unit without water will cause permanent damage to the pump. 5. Unplug unit before removing lid.  TROUBLESHOOTING GUIDE Pump not running, Water not flowing to the pad, Pad is not getting cold 1. Make sure the transformer is plugged into the wall outlet. 2. Confirm that the ice and water are filled to the indicated levels. 3. Make sure there are no kinks in the pad. 4. Gently pull on the blue tube to make sure the tube/pad junction is straight. 5. Remove the pad from the treatment site and ll it while the pad is lying at; then reapply. 6. Confirm that the pad couplings are securely attached to the unit. Listen for the double clicks (Figure 1) to confirm the pad couplings are securely attached.  Leaks    Note: Some condensation on the lines, controller, and pads is unavoidable, especially in warmer climates. 1. If using a Breg Polar Care Cold Therapy unit with a detachable Cold Therapy Pad, and a leak exists (other than condensation on the lines) disconnect the pad couplings. Make sure the silver tabs on the couplings are depressed before reconnecting the pad to the pump hose; then confirm both sides of the coupling are properly clicked in. 2. If the coupling continues to leak or a leak is detected in the pad itself, stop using it and call B[employer] Customer Care at [phone].  Cleaning After use, empty and dry the unit with a soft cloth.  Warm water and mild detergent may be used occasionally to clean the pump and tubes.  WARNING: The Polar Care Cube can be cold enough to cause serious injury, including full skin necrosis. Follow these Operating Instructions, and carefully read the Product Insert (see pouch on side of unit) and the Cold Therapy Pad Fitting Instructions (provided with each Cold Therapy Pad) prior to use.   Home Health services has been arranged  with [facility]. Start of care will be 05/05/2024.

## 2024-05-03 NOTE — H&P (Signed)
 HPI:  Edward Mccoy is a 73 y.o. male who presents today as a result of a call to our clinic for right shoulder pain.   The patient's symptoms began about 5 years ago and developed without any specific cause or injury. The patient describes the symptoms as moderate (patient is active but has had to make modifications or give up activities) and have the quality of being aching, nagging, stabbing, tender, and throbbing. The pain is localized to the lateral arm/shoulder. These symptoms are aggravated with normal daily activities, with sleeping, carrying heavy objects, at higher levels of activity, with overhead activity, reaching behind the back, and getting dressed. He has tried acetaminophen  and non-steroidal anti-inflammatories (Celebrex  ) with temporary partial relief of his symptoms. He has tried rest and activity modification with limited benefit. He has not tried any physical therapy or steroid injections for the symptoms. The patient denies any neck pain nor does he note any numbness or paresthesias down his arm to his hand. He is right-hand dominant. This complaint is not work related. He is a sports non-participant.  Shoulder Surgical History:  The patient has had no shoulder surgery in the past.  PMH/PSH/Family History/Social History/Meds/Allergies:  I have reviewed past medical, surgical, social and family history, medications and allergies as documented in the EMR.  Current Outpatient Medications:  ascorbic acid, vitamin C, (VITAMIN C) 1000 MG tablet Take 1,000 mg by mouth once daily  aspirin 81 MG EC tablet Take 81 mg by mouth once daily  atorvastatin  (LIPITOR) 40 MG tablet Take 1 tablet (40 mg total) by mouth once daily 90 tablet 3  celecoxib  (CELEBREX ) 200 MG capsule TAKE 1 CAPSULE BY MOUTH EVERY DAY 30 capsule 5  co-enzyme Q-10, ubiquinone, 100 mg capsule Take 100 mg by mouth once daily  multivitamin with iron-minerals (VITAMINS AND MINERALS) tablet Take 1 tablet by mouth once daily   NON FORMULARY Take by mouth Balance Of Nature Fruits and Veggies Supplement  omega-3 fatty acids-fish oil 360-1,200 mg Cap Take 1 capsule by mouth once daily  RED BEET ORAL Take by mouth  sildenafil  (REVATIO ) 20 mg tablet Take 1 tablet by mouth once daily  vitamin E 400 UNIT capsule Take 400 Units by mouth once daily   Allergies: No Known Allergies  Past Medical History:  Cancer of appendix (CMS/HHS-HCC)  Prostate cancer (CMS/HHS-HCC)   Past Surgical History:  APPENDECTOMY 2016  Left shoulder arthroscopy - 1999 & 2015   Family History:  No Known Problems Mother   Social History:   Socioeconomic History:  Marital status: Married  Tobacco Use  Smoking status: Never  Smokeless tobacco: Never  Vaping Use  Vaping status: Never Used  Substance and Sexual Activity  Alcohol use: No  Drug use: No  Sexual activity: Defer   Social Drivers of Health:   Physicist, medical Strain: Low Risk (03/15/2024)  Overall Financial Resource Strain (CARDIA)  Difficulty of Paying Living Expenses: Not hard at all  Food Insecurity: No Food Insecurity (03/15/2024)  Hunger Vital Sign  Worried About Running Out of Food in the Last Year: Never true  Ran Out of Food in the Last Year: Never true  Transportation Needs: No Transportation Needs (03/15/2024)  PRAPARE - Risk analyst (Medical): No  Lack of Transportation (Non-Medical): No   Review of Systems:  A comprehensive 14 point ROS was performed, reviewed, and the pertinent orthopaedic findings are documented in the HPI.  Physical Exam:  Vitals:  04/25/24 0933  BP: 110/66  Weight: 76.7 kg (169 lb 3.2 oz)  Height: 177.8 cm (5' 10)  PainSc: 0-No pain  PainLoc: Shoulder   General/Constitutional: The patient appears to be well-nourished, well-developed, and in no acute distress. Neuro/Psych: Normal mood and affect, oriented to person, place and time. Eyes: Non-icteric. Pupils are equal, round, and reactive to light,  and exhibit synchronous movement. ENT: Unremarkable. Lymphatic: No palpable adenopathy. Respiratory: Lungs clear to auscultation, Normal chest excursion, No wheezes, and Non-labored breathing Cardiovascular: Regular rate and rhythm. No murmurs. and No edema, swelling or tenderness, except as noted in detailed exam. Integumentary: No impressive skin lesions present, except as noted in detailed exam. Musculoskeletal: Unremarkable, except as noted in detailed exam.  Right shoulder exam: SKIN: Obvious Popeye deformity, otherwise unremarkable SWELLING: none WARMTH: none LYMPH NODES: no adenopathy palpable CREPITUS: none TENDERNESS: Mildly tender over anterolateral shoulder ROM (active):  Forward flexion: 165 degrees Abduction: 160 degrees Internal rotation: L1 ROM (passive):  Forward flexion: 170 degrees Abduction: 165 degrees  ER/IR at 90 abd: 90 degrees / 60 degrees  He describes mild pain with forward flexion and abduction more so than with internal rotation.  STRENGTH: Forward flexion: 4-4+/5 Abduction: 4-4+/5 External rotation: 4+/5 Internal rotation: 4+-5/5 Pain with RC testing: Mild pain with resisted abduction more so than with resisted forward flexion.  STABILITY: Normal  SPECIAL TESTS: Vonzell' test: positive, mild Speed's test: Minimally positive Capsulitis - pain w/ passive ER: no Crossed arm test: no Crank: Not evaluated Anterior apprehension: Negative Posterior apprehension: Not evaluated  He is neurovascularly intact to the right upper extremity.  Right knee exam: GAIT: Essentially normal gait and not using any assistive devices. ALIGNMENT: Mild valgus SKIN: Unremarkable SWELLING: Minimal EFFUSION: Trace WARMTH: None TENDERNESS: Moderate tenderness along lateral joint line, but no medial joint line tenderness ROM: 0-130 degrees with mild pain in maximal flexion McMURRAY'S: Equivocally positive PATELLOFEMORAL: Normal tracking with no peri-patellar  tenderness and negative apprehension sign CREPITUS: None LACHMAN'S: Negative PIVOT SHIFT: Not evaluated ANTERIOR DRAWER: Negative POSTERIOR DRAWER: Negative VARUS/VALGUS: Mildly positive pseudolaxity to valgus stressing  He is neurovascularly intact to the right lower extremity and foot.  Imaging:  True AP, Y-scapular, and axillary views of the right shoulder are obtained. These films demonstrate mild degenerative changes as manifested by a small inferior humeral osteophyte. The glenohumeral joint space appears to be well-maintained.. The subacromial space is mildly decreased. There is no subacromial or infra-clavicular spurring. He demonstrates a Type II acromion.  Assessment:  Primary osteoarthritis of right knee.   Plan:  The treatment options were discussed with the patient. In addition, patient educational materials were provided regarding the diagnosis and treatment options. The patient is frustrated by his shoulder symptoms and functional limitations, but feels that his right knee symptoms for which he was seen in November, 2025, are causing him more problems. Therefore, he would like to proceed with definitive management of his right knee symptoms at this time. Therefore, I have recommended a surgical procedure, specifically a right total knee arthroplasty. The procedure was discussed with the patient, as were the potential risks (including bleeding, infection, nerve and/or blood vessel injury, persistent or recurrent pain, loosening and/or failure of the components, dislocation, need for further surgery, blood clots, strokes, heart attacks and/or arhythmias, pneumonia, etc.) and benefits. The patient states his/her understanding and wishes to proceed. All of the patient's questions and concerns were answered. He can call any time with further concerns. He will follow up post-surgery, routine.    H&P reviewed and patient re-examined.  No changes.

## 2024-05-03 NOTE — Evaluation (Signed)
 Physical Therapy Evaluation Patient Details Name: Edward Mccoy MRN: [id number] DOB: June 17, 1951 Today's Date: 05/03/2024  History of Present Illness  Pt is a 73 y/o M s/p R TKA on 05/03/24. PMH signifcant for a flutter, prostate CA, appendix CA, HLD.  Clinical Impression  Pt A&Ox4, agreeable and motivated to participate in PT evaluation. At baseline, pt is IND with mobility/ADLs, no previous AD use, denies hx of falls. Pt was met semi-supine in bed, modI for bed mobility with use of bed features. STS from EOB with CGA, VC for proper hand placement on RW. Pt able to amb ~72ft forward/backward from EOB with CGA, VC for step-to sequencing and RW positioning. Pt noted to have minimal active R dorsiflexion, limiting his ability to advance RLE and resulted in R foot drag with amb. Pt was left semi-supine in bed at end of session, all needs in reach, supportive spouse at bedside. Pt would benefit from skilled PT intervention to address listed deficits (see PT Problem List) and allow for safe return to PLOF.         If plan is discharge home, recommend the following: A little help with walking and/or transfers;A little help with bathing/dressing/bathroom;Assist for transportation;Help with stairs or ramp for entrance   Can travel by private vehicle        Equipment Recommendations None recommended by PT (pt has recommended DME)  Recommendations for Other Services       Functional Status Assessment Patient has had a recent decline in their functional status and demonstrates the ability to make significant improvements in function in a reasonable and predictable amount of time.     Precautions / Restrictions Precautions Precautions: Knee;Fall Precaution Booklet Issued: Yes (comment) Recall of Precautions/Restrictions: Intact Restrictions Weight Bearing Restrictions Per Provider Order: Yes RLE Weight Bearing Per Provider Order: Weight bearing as tolerated      Mobility  Bed Mobility Overal  bed mobility: Modified Independent             General bed mobility comments: no physical assistance, use of bed features and inc time/effort    Transfers Overall transfer level: Needs assistance Equipment used: Rolling walker (2 wheels) Transfers: Sit to/from Stand Sit to Stand: Contact guard assist           General transfer comment: STS from EOB with CGA, VC for proper hand placement    Ambulation/Gait Ambulation/Gait assistance: Contact guard assist Gait Distance (Feet): 3 Feet (forward/backward) Assistive device: Rolling walker (2 wheels) Gait Pattern/deviations: Step-to pattern, Decreased dorsiflexion - right, Wide base of support       General Gait Details: no overt LOB, mild increased postural sway and decreased dorsiflexion of R ankle resulting in R foot drag. max VC for RW/step sequencing for step-to pattern  Stairs            Wheelchair Mobility     Tilt Bed    Modified Rankin (Stroke Patients Only)       Balance Overall balance assessment: Needs assistance Sitting-balance support: Feet supported Sitting balance-Leahy Scale: Good Sitting balance - Comments: steady static and dynamic sitting   Standing balance support: Bilateral upper extremity supported Standing balance-Leahy Scale: Fair Standing balance comment: heavy BUE support on RW                             Pertinent Vitals/Pain Pain Assessment Pain Assessment: No/denies pain    Home Living Family/patient expects to be discharged to:: Private  residence Living Arrangements: Spouse/significant other Available Help at Discharge: Family;Available 24 hours/day Type of Home: House Home Access: Ramped entrance       Home Layout: One level Home Equipment: Rolling Walker (2 wheels);BSC/3in1;Cane - single point;Wheelchair - manual;Crutches      Prior Function Prior Level of Function : Independent/Modified Independent             Mobility Comments: IND with  mobility, no previous AD use, denies hx of falls ADLs Comments: IND with ADLs     Extremity/Trunk Assessment   Upper Extremity Assessment Upper Extremity Assessment: Overall WFL for tasks assessed    Lower Extremity Assessment Lower Extremity Assessment: RLE deficits/detail;LLE deficits/detail RLE Deficits / Details: able to actively move LE against gravity into hip flexion, knee ext. Minimal active dorsiflexion. -7 deg knee extension, 80 deg knee flexion RLE Sensation: WNL LLE Deficits / Details: grossly WFL LLE Sensation: WNL       Communication   Communication Communication: No apparent difficulties    Cognition Arousal: Alert Behavior During Therapy: WFL for tasks assessed/performed   PT - Cognitive impairments: No apparent impairments                       PT - Cognition Comments: A&Ox4, pleasant and cooperative Following commands: Intact       Cueing Cueing Techniques: Verbal cues, Visual cues     General Comments      Exercises Total Joint Exercises Ankle Circles/Pumps: AROM, Both, 10 reps, Supine Quad Sets: AROM, Right, 5 reps, Supine Heel Slides: AROM, Right, 5 reps, Supine Long Arc Quad: AROM, Right, 5 reps, Seated Other Exercises Other Exercises: HR 90 bpm SpO2 95% on RA after STS transfer   Assessment/Plan    PT Assessment Patient needs continued PT services  PT Problem List Decreased strength;Decreased range of motion;Decreased activity tolerance;Decreased mobility;Decreased balance;Decreased knowledge of use of DME       PT Treatment Interventions DME instruction;Gait training;Functional mobility training;Therapeutic activities;Therapeutic exercise;Balance training;Neuromuscular re-education;Patient/family education    PT Goals (Current goals can be found in the Care Plan section)  Acute Rehab PT Goals Patient Stated Goal: to go home PT Goal Formulation: With patient Time For Goal Achievement: 05/17/24 Potential to Achieve Goals:  Good    Frequency BID     Co-evaluation               AM-PAC PT 6 Clicks Mobility  Outcome Measure Help needed turning from your back to your side while in a flat bed without using bedrails?: None Help needed moving from lying on your back to sitting on the side of a flat bed without using bedrails?: A Little Help needed moving to and from a bed to a chair (including a wheelchair)?: A Little Help needed standing up from a chair using your arms (e.g., wheelchair or bedside chair)?: A Little Help needed to walk in hospital room?: A Little Help needed climbing 3-5 steps with a railing? : A Lot 6 Click Score: 18    End of Session Equipment Utilized During Treatment: Gait belt Activity Tolerance: Patient tolerated treatment well Patient left: in bed;with call bell/phone within reach;with family/visitor present Nurse Communication: Mobility status PT Visit Diagnosis: Unsteadiness on feet (R26.81);Difficulty in walking, not elsewhere classified (R26.2)    Time: 8368-8341 PT Time Calculation (min) (ACUTE ONLY): 27 min   Charges:   PT Evaluation $PT Eval Moderate Complexity: 1 Mod   PT General Charges $$ ACUTE PT VISIT: 1 Visit  Maximo Spratling, SPT

## 2024-05-04 ENCOUNTER — Encounter: Payer: Self-pay | Admitting: Surgery

## 2024-05-04 ENCOUNTER — Other Ambulatory Visit (HOSPITAL_COMMUNITY): Payer: Self-pay

## 2024-05-04 DIAGNOSIS — M1711 Unilateral primary osteoarthritis, right knee: Secondary | ICD-10-CM | POA: Diagnosis not present

## 2024-05-04 NOTE — Care Management Obs Status (Signed)
 MEDICARE OBSERVATION STATUS NOTIFICATION   Patient Details  Name: Edward Mccoy MRN: [id number] Date of Birth: 07/11/1951   Medicare Observation Status Notification Given:  Yes    [name] 05/04/2024, 11:09 AM

## 2024-05-04 NOTE — Anesthesia Postprocedure Evaluation (Signed)
 Anesthesia Post Note  Patient: Edward Mccoy  Procedure(s) Performed: ARTHROPLASTY, KNEE, TOTAL (Right: Knee)  Patient location during evaluation: Nursing Unit Anesthesia Type: Spinal Level of consciousness: oriented and awake and alert Pain management: pain level controlled Vital Signs Assessment: post-procedure vital signs reviewed and stable Respiratory status: spontaneous breathing and respiratory function stable Cardiovascular status: blood pressure returned to baseline and stable Postop Assessment: no headache, no backache, no apparent nausea or vomiting and patient able to bend at knees Anesthetic complications: no   No notable events documented.   Last Vitals:  Vitals:   05/04/24 0400 05/04/24 0807  BP: 136/74 (!) 145/82  Pulse: 60 78  Resp: 16 16  Temp: 36.7 C 36.7 C  SpO2: 98% 98%    Last Pain:  Vitals:   05/04/24 0807  TempSrc: Oral  PainSc: 5                  [place] [facility]

## 2024-05-04 NOTE — Discharge Summary (Signed)
 Physician Discharge Summary  Patient ID: Edward Mccoy MRN: [id number] DOB/AGE: Mar 05, 1951 73 y.o.  Admit date: 05/03/2024 Discharge date: 05/04/2024  Admission Diagnoses:  Primary osteoarthritis of right knee [M17.11] Status post total knee replacement not using cement, right [Z96.651]  Discharge Diagnoses: Patient Active Problem List   Diagnosis Date Noted   Status post total knee replacement not using cement, right 05/03/2024   History of colonic polyps    Polyp of sigmoid colon    History of prostate cancer 04/15/2019   Erectile dysfunction after radical prostatectomy 04/16/2018   Prediabetes 09/08/2017   HLD (hyperlipidemia) 07/26/2015   Arthritis, degenerative 07/26/2015   Thyroid  nodule 07/26/2015   Cellulitis of knee, right 05/22/2015   Malignant neoplasm of appendix (HCC) 11/20/2014   ED (erectile dysfunction) of organic origin 01/31/2013   H/O malignant neoplasm of prostate 01/31/2013    Past Medical History:  Diagnosis Date   Atrial flutter (HCC) 05/28/2022   Cancer (HCC) 2008   prostate   Cancer of appendix (HCC) 11/15/2014   high-grade appendiceal mucinous neoplasm   Colon polyp    ED (erectile dysfunction)    Hyperlipidemia    MRSA infection 05/2015   Right Kneee     Transfusion: None.   Consultants (if any):   Discharged Condition: Improved  Hospital Course: Edward Mccoy is an 73 y.o. male who was admitted 05/03/2024 with a diagnosis of right knee primary osteoarthritis and went to the operating room on 05/03/2024 and underwent the above named procedures.    Surgeries: Procedure(s): ARTHROPLASTY, KNEE, TOTAL on 05/03/2024 Patient tolerated the surgery well. Taken to PACU where he was stabilized and then transferred to the post op recovery area.  Started on Eliquis 2.5mg  every 12 hrs. Heels elevated on bed with rolled towels. No evidence of DVT. Negative Homan. Physical therapy started on day #0 for gait training and transfer. OT started day #1 for  ADL and assisted devices.  Patient's IV was removed on POD1.  Implants: Right TKA using all-pressfit Zimmer Persona system with a #9 PCR femur, a(n) F-sized  tibial tray with a 10 mm medial congruent E-poly insert, and a 9 x 32 mm all-poly 3-pegged domed patella.   He was given perioperative antibiotics:  Anti-infectives (From admission, onward)    Start     Dose/Rate Route Frequency Ordered Stop   05/03/24 1700  ceFAZolin (ANCEF) IVPB 2g/100 mL premix        2 g 200 mL/hr over 30 Minutes Intravenous Every 6 hours 05/03/24 1309 05/03/24 2354   05/03/24 0930  ceFAZolin (ANCEF) IVPB 2g/100 mL premix        2 g 200 mL/hr over 30 Minutes Intravenous On call to O.R. 05/03/24 9075 05/03/24 1102     .  He was given sequential compression devices, early ambulation, and Eliquis for DVT prophylaxis.  He benefited maximally from the hospital stay and there were no complications.    Recent vital signs:  Vitals:   05/03/24 2110 05/04/24 0400  BP: 139/77 136/74  Pulse:  60  Resp: 16 16  Temp: 98.1 F (36.7 C) 98.1 F (36.7 C)  SpO2: 97% 98%    Recent laboratory studies:  Lab Results  Component Value Date   HGB 13.4 04/27/2024   HGB 13.9 06/23/2022   HGB 14.8 04/29/2021   Lab Results  Component Value Date   WBC 5.7 04/27/2024   PLT 209 04/27/2024   No results found for: INR Lab Results  Component Value Date  NA 138 04/27/2024   K 4.4 04/27/2024   CL 102 04/27/2024   CO2 28 04/27/2024   BUN 15 04/27/2024   CREATININE 1.16 04/27/2024   GLUCOSE 87 04/27/2024    Discharge Medications:   Allergies as of 05/04/2024   No Known Allergies      Medication List     STOP taking these medications    aspirin EC 81 MG tablet       TAKE these medications    AMBULATORY NON FORMULARY MEDICATION Trimix 150/5/50 -(Pap/Phent/PGE)  Dosage: Inject 0.2cc as directed, may increase by 0.2cc until erections last 30-60 minutes   Vial 1ml  Qty #46ml Refills  3    [facility], [place].  Phone: [phone] [phone]   apixaban 2.5 MG Tabs tablet Commonly known as: Eliquis Take 1 tablet (2.5 mg total) by mouth 2 (two) times daily.   atorvastatin  40 MG tablet Commonly known as: LIPITOR TAKE 1 TABLET BY MOUTH EVERY DAY   celecoxib  200 MG capsule Commonly known as: CELEBREX  TAKE 1 CAPSULE BY MOUTH EVERY DAY AS NEEDED What changed: See the new instructions.   chlorhexidine 4 % external liquid Commonly known as: HIBICLENS Apply 15 mLs (1 Application total) topically as directed for 30 doses. Use as directed daily for 5 days every other week for 6 weeks.   chlorhexidine 4 % external liquid Commonly known as: HIBICLENS Apply 15 mLs (1 Application total) topically as directed for 30 doses. Use as directed daily for 5 days every other week for 6 weeks.   Melatonin 10 MG Tabs Take 10 mg by mouth at bedtime as needed (sleep).   MULTIVITAMIN ADULT PO Take 1 tablet by mouth daily.   mupirocin  ointment 2 % Commonly known as: BACTROBAN  Place 1 application  into the nose daily as needed (immune support). What changed: Another medication with the same name was added. Make sure you understand how and when to take each.   mupirocin  ointment 2 % Commonly known as: BACTROBAN  Place 1 Application into the nose 2 (two) times daily for 60 doses. Use as directed 2 times daily for 5 days every other week for 6 weeks. What changed: You were already taking a medication with the same name, and this prescription was added. Make sure you understand how and when to take each.   mupirocin  ointment 2 % Commonly known as: BACTROBAN  Place 1 Application into the nose 2 (two) times daily for 60 doses. Use as directed 2 times daily for 5 days every other week for 6 weeks. What changed: You were already taking a medication with the same name, and this prescription was added. Make sure you understand how and when to take each.   OVER THE  COUNTER MEDICATION Take 3 capsules by mouth daily. Balance in Nature fruits   OVER THE COUNTER MEDICATION Take 3 tablets by mouth daily. Balance of Nature veggies   OVER THE COUNTER MEDICATION Take 1 Scoop by mouth daily. Super Beets   oxyCODONE 5 MG immediate release tablet Commonly known as: Roxicodone Take 1-2 tablets (5-10 mg total) by mouth every 4 (four) hours as needed for moderate pain (pain score 4-6) or severe pain (pain score 7-10).   sildenafil  20 MG tablet Commonly known as: REVATIO  Take 1 tablet by mouth once daily        Diagnostic Studies: DG Knee Right Port Result Date: 05/03/2024 CLINICAL DATA:  Postop. EXAM: DG KNEE 1-2V PORT*R* COMPARISON:  None Available. FINDINGS: Right knee arthroplasty in expected  alignment. No periprosthetic lucency or fracture. Recent postsurgical change includes air and edema in the soft tissues and joint space. Anterior skin staples. IMPRESSION: Right knee arthroplasty without immediate postoperative complication. Electronically Signed   By: Andrea Gasman M.D.   On: 05/03/2024 15:34    Disposition: Plan for d/c home today pending progress with PT.  Still some weakness with dorsiflexion of the right foot.  Likely due from numbing medication at the time of surgery, should continue to improve over the next 24-28 hours.     Follow-up Information     Charlene Debby BROCKS, PA-C. Go on 05/18/2024.   Specialties: Orthopedic Surgery, Emergency Medicine Why: 2:15 pm for Physical Therapy, 3:00pm follow up for postoperative visit Contact information: [address] [place] [address] [phone]                Signed: Lynwood LITTIE Level PA-C 05/04/2024, 7:42 AM

## 2024-05-04 NOTE — Care Management Obs Status (Signed)
 MEDICARE OBSERVATION STATUS NOTIFICATION   Patient Details  Name: TARO HIDROGO MRN: [id number] Date of Birth: 06/15/1951   Medicare Observation Status Notification Given:       [name] 05/04/2024, 11:04 AM

## 2024-05-04 NOTE — Progress Notes (Signed)
 Physical Therapy Treatment Patient Details Name: Edward Mccoy MRN: [id number] DOB: 1951/06/02 Today's Date: 05/04/2024   History of Present Illness Pt is a 73 y/o M s/p R TKA on 05/03/24. PMH signifcant for a flutter, prostate CA, appendix CA, HLD.    PT Comments  Pt is A and O x 4. Agreeable and pleasant. Demonstrated safe abilities to exit bed, stand, and tolerate ambulation with use of RW. R LE foot drop present however author encouraged pt to continues to perform strtching and exercises to promote return in DF. Overall pt is progressing well. Cleared form an acute PT standpoint for safe DC home with HHPT to follow.     If plan is discharge home, recommend the following: A little help with walking and/or transfers;A little help with bathing/dressing/bathroom;Assist for transportation;Help with stairs or ramp for entrance     Equipment Recommendations  None recommended by PT       Precautions / Restrictions Precautions Precautions: Knee;Fall Precaution Booklet Issued: Yes (comment) Recall of Precautions/Restrictions: Intact Restrictions Weight Bearing Restrictions Per Provider Order: Yes RLE Weight Bearing Per Provider Order: Weight bearing as tolerated     Mobility  Bed Mobility Overal bed mobility: Modified Independent   Transfers Overall transfer level: Needs assistance Equipment used: Rolling walker (2 wheels) Transfers: Sit to/from Stand Sit to Stand: Supervision   Ambulation/Gait Ambulation/Gait assistance: Supervision Gait Distance (Feet): 200 Feet Assistive device: Rolling walker (2 wheels) Gait Pattern/deviations: Step-to pattern, Step-through pattern, Ataxic Gait velocity: decreased  General Gait Details: Pt was able to ambulate 200 ft with RW. no LOB. RLE foot drop still present. PA/MD aware. no LOB or safety concern during standing activity   Stairs Stairs: Yes  General stair comments: Pt has ramp entry to home however correctly states proper stair  performance. He demonstartes strength and abilities to easily perform stairs.     Balance Overall balance assessment: Needs assistance Sitting-balance support: Feet supported Sitting balance-Leahy Scale: Good     Standing balance support: Bilateral upper extremity supported Standing balance-Leahy Scale: Fair Standing balance comment: heavy BUE support on RW       Communication Communication Communication: No apparent difficulties  Cognition Arousal: Alert Behavior During Therapy: WFL for tasks assessed/performed   PT - Cognitive impairments: No apparent impairments      PT - Cognition Comments: A&Ox4, pleasant and cooperative Following commands: Intact      Cueing Cueing Techniques: Verbal cues, Tactile cues  Exercises Total Joint Exercises Goniometric ROM: 3-92    General Comments General comments (skin integrity, edema, etc.): Pt perform stretching exercises after ambulation to proote return in AROM/strength      Pertinent Vitals/Pain Pain Assessment Pain Assessment: 0-10 Pain Score: 5  Pain Descriptors / Indicators: Discomfort Pain Intervention(s): Limited activity within patient's tolerance, Monitored during session, Premedicated before session, Repositioned, Ice applied     PT Goals (current goals can now be found in the care plan section) Acute Rehab PT Goals Patient Stated Goal: go home    Frequency    BID       AM-PAC PT 6 Clicks Mobility   Outcome Measure  Help needed turning from your back to your side while in a flat bed without using bedrails?: None Help needed moving from lying on your back to sitting on the side of a flat bed without using bedrails?: None Help needed moving to and from a bed to a chair (including a wheelchair)?: A Little Help needed standing up from a chair using your arms (  e.g., wheelchair or bedside chair)?: A Little Help needed to walk in hospital room?: A Little Help needed climbing 3-5 steps with a railing? : A  Little 6 Click Score: 20    End of Session Equipment Utilized During Treatment: Gait belt Activity Tolerance: Patient tolerated treatment well Patient left: in chair;with call bell/phone within reach;with nursing/sitter in room Nurse Communication: Mobility status PT Visit Diagnosis: Unsteadiness on feet (R26.81);Difficulty in walking, not elsewhere classified (R26.2)     Time: 9266-9195 PT Time Calculation (min) (ACUTE ONLY): 31 min  Charges:    $Gait Training: 8-22 mins $Therapeutic Activity: 8-22 mins PT General Charges $$ ACUTE PT VISIT: 1 Visit                    Rankin Essex PTA 05/04/24, 8:14 AM

## 2024-05-04 NOTE — TOC Initial Note (Signed)
 Transition of Care [facility]) - Initial/Assessment Note    Patient Details  Name: Edward Mccoy MRN: [id number] Date of Birth: 1950/11/07  [facility] CM/SW Contact:    [place] L Elmor Kost, LCSW Phone Number: 05/04/2024, 9:39 AM  Clinical Narrative:                    CSW met with patient. No family at bedside. HHA services were discussed. Patient advised that he received a call on Monday from an agency but he can not recall the name. He stated that services will begin for him tomorrow.   CSW searched [employer] for the [facility] provider and there is none listed.   CSW reached out to [place] [facility] and it was confirmed that patient is on their roster for services.   DME was discussed. Patient advised that between he and his spouse, they have DME covered. Patient declined DME if there are suggestions.      Patient Goals and CMS Choice            Expected Discharge Plan and Services         Expected Discharge Date: 05/04/24                                    Prior Living Arrangements/Services                       Activities of Daily Living   ADL Screening (condition at time of admission) Independently performs ADLs?: Yes (appropriate for developmental age) Is the patient deaf or have difficulty hearing?: No Does the patient have difficulty seeing, even when wearing glasses/contacts?: No Does the patient have difficulty concentrating, remembering, or making decisions?: No  Permission Sought/Granted                  Emotional Assessment              Admission diagnosis:  Primary osteoarthritis of right knee [M17.11] Status post total knee replacement not using cement, right [Z96.651] Patient Active Problem List   Diagnosis Date Noted   Status post total knee replacement not using cement, right 05/03/2024   History of colonic polyps    Polyp of sigmoid colon    History of prostate cancer 04/15/2019   Erectile dysfunction after  radical prostatectomy 04/16/2018   Prediabetes 09/08/2017   HLD (hyperlipidemia) 07/26/2015   Arthritis, degenerative 07/26/2015   Thyroid  nodule 07/26/2015   Cellulitis of knee, right 05/22/2015   Malignant neoplasm of appendix (HCC) 11/20/2014   ED (erectile dysfunction) of organic origin 01/31/2013   H/O malignant neoplasm of prostate 01/31/2013   PCP:  Bertrum Charlie CROME, MD Pharmacy:   [facility] [address] [place] - [address] [place] [address] Phone: [phone] Fax: [phone]  [facility] - [place], [place] - [address] [place] [address] Phone: [phone] Fax: [phone]  [facility] [address] [place] - [address] [place] [address] Phone: [phone] Fax: [phone]  [facility] [address]), [place] - [address] [place]) [place] [address] Phone: [phone] Fax: [phone]     Social Drivers of Health (SDOH) Social History: SDOH Screenings   Food Insecurity: No Food Insecurity (05/03/2024)  Housing: High Risk (05/03/2024)  Transportation Needs: No  Transportation Needs (05/03/2024)  Utilities: Not At Risk (05/03/2024)  Alcohol Screen: Low Risk  (04/29/2021)  Depression (PHQ2-9): Low Risk  (04/29/2021)  Financial Resource Strain: Low Risk  (03/15/2024)   Received from [facility]  Physical Activity: Sufficiently Active (04/23/2020)  Social Connections: Moderately Isolated (05/03/2024)  Stress: No Stress Concern Present (04/23/2020)  Tobacco Use: Low Risk  (05/03/2024)   SDOH Interventions:     Readmission Risk Interventions     No data to display

## 2024-05-04 NOTE — Progress Notes (Signed)
 DISCHARGE NOTE:  Pt given discharge instructions and verbalized understanding. TED hose on both legs. Personal walker sent with pt. Pt wheeled to car by staff, wife providing transportation.

## 2024-05-04 NOTE — Progress Notes (Signed)
  Subjective: 1 Day Post-Op Procedure(s) (LRB): ARTHROPLASTY, KNEE, TOTAL (Right) Patient reports pain as mild.   Patient is well, and has had no acute complaints or problems Plan is to go Home after hospital stay. Negative for chest pain and shortness of breath Fever: no Gastrointestinal:Negative for nausea and vomiting Reports he is passing gas this AM.  Objective: Vital signs in last 24 hours: Temp:  [97 F (36.1 C)-98.8 F (37.1 C)] 98.1 F (36.7 C) (10/01 0400) Pulse Rate:  [50-72] 60 (10/01 0400) Resp:  [14-22] 16 (10/01 0400) BP: (112-159)/(74-88) 136/74 (10/01 0400) SpO2:  [97 %-100 %] 98 % (10/01 0400) Weight:  [75.8 kg] 75.8 kg (09/30 1800)  Intake/Output from previous day:  Intake/Output Summary (Last 24 hours) at 05/04/2024 0738 Last data filed at 05/03/2024 1516 Gross per 24 hour  Intake 925 ml  Output 705 ml  Net 220 ml    Intake/Output this shift: No intake/output data recorded.  Labs: No results for input(s): HGB in the last 72 hours. No results for input(s): WBC, RBC, HCT, PLT in the last 72 hours. No results for input(s): NA, K, CL, CO2, BUN, CREATININE, GLUCOSE, CALCIUM  in the last 72 hours. No results for input(s): LABPT, INR in the last 72 hours.   EXAM General - Patient is Alert, Appropriate, and Oriented Extremity - ABD soft Incision: Mild bloody drainage to the distal aspect of the incision. No cellulitis present Dressing/Incision - Honeycomb dressing intact to the right knee.  Moderate bloody drainage noted distally. Motor Function - Sensation intact to the right foot.  Still some weakness with dorsiflexion of the foot, able to flex and extend toes, he is able to perform a straight leg raise.  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   Weight as of this encounter: 75.8 kg. Advance diet Up with therapy  Vitals reviewed this AM. Up with therapy today. Continue to work on a BM. Weakness with dorsiflexion of the right foot, sensation improving.  Instructed the patient this should gradually return over the next 24-48 hours. Plan for d/c home today pending progress with PT.  DVT Prophylaxis - TED hose and Eliquis Weight-Bearing as tolerated to right leg  J. Gustavo Level, PA-C [facility] Orthopaedic Surgery 05/04/2024, 7:38 AM

## 2024-05-05 DIAGNOSIS — E785 Hyperlipidemia, unspecified: Secondary | ICD-10-CM | POA: Diagnosis not present

## 2024-05-05 DIAGNOSIS — Z860100 Personal history of colon polyps, unspecified: Secondary | ICD-10-CM | POA: Diagnosis not present

## 2024-05-05 DIAGNOSIS — I10 Essential (primary) hypertension: Secondary | ICD-10-CM | POA: Diagnosis not present

## 2024-05-05 DIAGNOSIS — Z471 Aftercare following joint replacement surgery: Secondary | ICD-10-CM | POA: Diagnosis not present

## 2024-05-05 DIAGNOSIS — Z7901 Long term (current) use of anticoagulants: Secondary | ICD-10-CM | POA: Diagnosis not present

## 2024-05-05 DIAGNOSIS — E041 Nontoxic single thyroid nodule: Secondary | ICD-10-CM | POA: Diagnosis not present

## 2024-05-05 DIAGNOSIS — N5231 Erectile dysfunction following radical prostatectomy: Secondary | ICD-10-CM | POA: Diagnosis not present

## 2024-05-05 DIAGNOSIS — R7303 Prediabetes: Secondary | ICD-10-CM | POA: Diagnosis not present

## 2024-05-05 DIAGNOSIS — Z8589 Personal history of malignant neoplasm of other organs and systems: Secondary | ICD-10-CM | POA: Diagnosis not present

## 2024-05-05 DIAGNOSIS — I4892 Unspecified atrial flutter: Secondary | ICD-10-CM | POA: Diagnosis not present

## 2024-05-05 DIAGNOSIS — Z8546 Personal history of malignant neoplasm of prostate: Secondary | ICD-10-CM | POA: Diagnosis not present

## 2024-05-05 DIAGNOSIS — Z96651 Presence of right artificial knee joint: Secondary | ICD-10-CM | POA: Diagnosis not present

## 2024-05-11 ENCOUNTER — Encounter: Payer: Self-pay | Admitting: Urology

## 2024-05-13 DIAGNOSIS — Z471 Aftercare following joint replacement surgery: Secondary | ICD-10-CM | POA: Diagnosis not present

## 2024-05-18 DIAGNOSIS — M25561 Pain in right knee: Secondary | ICD-10-CM | POA: Diagnosis not present

## 2024-05-18 DIAGNOSIS — Z96651 Presence of right artificial knee joint: Secondary | ICD-10-CM | POA: Diagnosis not present

## 2024-05-20 DIAGNOSIS — Z96651 Presence of right artificial knee joint: Secondary | ICD-10-CM | POA: Diagnosis not present

## 2024-05-20 DIAGNOSIS — M25561 Pain in right knee: Secondary | ICD-10-CM | POA: Diagnosis not present

## 2024-05-25 DIAGNOSIS — M25561 Pain in right knee: Secondary | ICD-10-CM | POA: Diagnosis not present

## 2024-05-25 DIAGNOSIS — Z96651 Presence of right artificial knee joint: Secondary | ICD-10-CM | POA: Diagnosis not present

## 2024-05-27 DIAGNOSIS — M25561 Pain in right knee: Secondary | ICD-10-CM | POA: Diagnosis not present

## 2024-05-27 DIAGNOSIS — Z96651 Presence of right artificial knee joint: Secondary | ICD-10-CM | POA: Diagnosis not present

## 2024-05-31 DIAGNOSIS — M25561 Pain in right knee: Secondary | ICD-10-CM | POA: Diagnosis not present

## 2024-05-31 DIAGNOSIS — Z96651 Presence of right artificial knee joint: Secondary | ICD-10-CM | POA: Diagnosis not present

## 2024-06-02 DIAGNOSIS — Z96651 Presence of right artificial knee joint: Secondary | ICD-10-CM | POA: Diagnosis not present

## 2024-06-02 DIAGNOSIS — M25561 Pain in right knee: Secondary | ICD-10-CM | POA: Diagnosis not present

## 2024-06-06 ENCOUNTER — Other Ambulatory Visit: Payer: Self-pay | Admitting: *Deleted

## 2024-06-06 ENCOUNTER — Telehealth: Payer: Self-pay | Admitting: Urology

## 2024-06-06 NOTE — Telephone Encounter (Signed)
 Per patient and Dr. Neale my chart conversation, he needs to send in:  Looks like Edward Mccoy has a super Trimix at a concentration of 150/10/100; our records show you are using the 150/5/50. If that latter dose is correct I am happy to send in the stronger concentration if you want to try.

## 2024-06-06 NOTE — Telephone Encounter (Signed)
 Sent message to Dr. Twylla to sign off on this medication. I called patient and he states he want to try it .

## 2024-06-08 DIAGNOSIS — M25561 Pain in right knee: Secondary | ICD-10-CM | POA: Diagnosis not present

## 2024-06-08 DIAGNOSIS — Z96651 Presence of right artificial knee joint: Secondary | ICD-10-CM | POA: Diagnosis not present

## 2024-06-10 DIAGNOSIS — Z96651 Presence of right artificial knee joint: Secondary | ICD-10-CM | POA: Diagnosis not present

## 2024-06-10 DIAGNOSIS — M25561 Pain in right knee: Secondary | ICD-10-CM | POA: Diagnosis not present

## 2024-06-13 DIAGNOSIS — Z96651 Presence of right artificial knee joint: Secondary | ICD-10-CM | POA: Diagnosis not present

## 2024-06-13 MED ORDER — AMBULATORY NON FORMULARY MEDICATION: 3 refills | Status: AC

## 2024-07-12 ENCOUNTER — Other Ambulatory Visit: Payer: Self-pay | Admitting: Urology

## 2025-04-19 ENCOUNTER — Other Ambulatory Visit

## 2025-04-26 ENCOUNTER — Ambulatory Visit: Admitting: Urology
